# Patient Record
Sex: Female | Born: 1960
Health system: Southern US, Community
[De-identification: ages and names within clinical notes are randomized; demographics above are authoritative.]

## PROBLEM LIST (undated history)

## (undated) DIAGNOSIS — M5030 Other cervical disc degeneration, unspecified cervical region: Secondary | ICD-10-CM

## (undated) DIAGNOSIS — Z780 Asymptomatic menopausal state: Secondary | ICD-10-CM

## (undated) DIAGNOSIS — F419 Anxiety disorder, unspecified: Secondary | ICD-10-CM

## (undated) DIAGNOSIS — K5900 Constipation, unspecified: Secondary | ICD-10-CM

## (undated) DIAGNOSIS — I4891 Unspecified atrial fibrillation: Secondary | ICD-10-CM

## (undated) HISTORY — PX: ABDOMINAL HYSTERECTOMY: SHX81

## (undated) HISTORY — DX: Constipation, unspecified: K59.00

## (undated) HISTORY — PX: SHOULDER ARTHROSCOPY: SHX128

## (undated) HISTORY — DX: Unspecified atrial fibrillation: I48.91

## (undated) HISTORY — DX: Asymptomatic menopausal state: Z78.0

## (undated) HISTORY — PX: PLANTAR FASCIECTOMY: SUR600

## (undated) HISTORY — PX: BUNIONECTOMY: SHX129

## (undated) HISTORY — PX: CARDIAC ELECTROPHYSIOLOGY MAPPING AND ABLATION: SHX1292

## (undated) HISTORY — PX: SKIN CANCER EXCISION: SHX779

---

## 1998-03-18 HISTORY — PX: PARTIAL HYSTERECTOMY: SHX80

## 1999-03-19 HISTORY — PX: BREAST ENHANCEMENT SURGERY: SHX7

## 2003-12-10 ENCOUNTER — Emergency Department (HOSPITAL_COMMUNITY): Admission: AC | Admit: 2003-12-10 | Discharge: 2003-12-10 | Payer: Self-pay

## 2007-02-11 ENCOUNTER — Ambulatory Visit (HOSPITAL_COMMUNITY): Admission: RE | Admit: 2007-02-11 | Discharge: 2007-02-11 | Payer: Self-pay | Admitting: Family Medicine

## 2008-02-24 ENCOUNTER — Ambulatory Visit (HOSPITAL_COMMUNITY): Admission: RE | Admit: 2008-02-24 | Discharge: 2008-02-24 | Payer: Self-pay | Admitting: Family Medicine

## 2008-07-08 ENCOUNTER — Ambulatory Visit (HOSPITAL_COMMUNITY): Admission: RE | Admit: 2008-07-08 | Discharge: 2008-07-08 | Payer: Self-pay | Admitting: Family Medicine

## 2009-06-02 ENCOUNTER — Ambulatory Visit (HOSPITAL_COMMUNITY): Admission: RE | Admit: 2009-06-02 | Discharge: 2009-06-02 | Payer: Self-pay | Admitting: Family Medicine

## 2011-01-07 ENCOUNTER — Other Ambulatory Visit: Payer: Self-pay | Admitting: Family Medicine

## 2011-01-07 ENCOUNTER — Ambulatory Visit (HOSPITAL_COMMUNITY)
Admission: RE | Admit: 2011-01-07 | Discharge: 2011-01-07 | Disposition: A | Discharge status: Home / Self Care | Payer: BC Managed Care – PPO | Source: Ambulatory Visit | Attending: Family Medicine | Admitting: Family Medicine

## 2011-01-07 DIAGNOSIS — R51 Headache: Secondary | ICD-10-CM

## 2011-01-07 DIAGNOSIS — R110 Nausea: Secondary | ICD-10-CM | POA: Insufficient documentation

## 2011-06-25 ENCOUNTER — Other Ambulatory Visit: Payer: Self-pay | Admitting: Family Medicine

## 2011-06-25 ENCOUNTER — Ambulatory Visit (HOSPITAL_COMMUNITY)
Admission: RE | Admit: 2011-06-25 | Discharge: 2011-06-25 | Disposition: A | Discharge status: Home / Self Care | Payer: BC Managed Care – PPO | Source: Ambulatory Visit | Attending: Family Medicine | Admitting: Family Medicine

## 2011-06-25 DIAGNOSIS — R229 Localized swelling, mass and lump, unspecified: Secondary | ICD-10-CM | POA: Insufficient documentation

## 2011-06-25 DIAGNOSIS — R2230 Localized swelling, mass and lump, unspecified upper limb: Secondary | ICD-10-CM

## 2011-07-04 ENCOUNTER — Telehealth (INDEPENDENT_AMBULATORY_CARE_PROVIDER_SITE_OTHER): Payer: Self-pay

## 2011-07-04 NOTE — Telephone Encounter (Signed)
LMOV that Dr. Abbey Chatters would not remove a cyst from the pt's ear.  Recommended they contact a plastic surgeon or ENT.

## 2011-07-30 ENCOUNTER — Other Ambulatory Visit: Payer: Self-pay | Admitting: Obstetrics and Gynecology

## 2011-07-30 DIAGNOSIS — N63 Unspecified lump in unspecified breast: Secondary | ICD-10-CM

## 2011-08-05 ENCOUNTER — Other Ambulatory Visit: Payer: BC Managed Care – PPO

## 2011-08-08 ENCOUNTER — Ambulatory Visit
Admission: RE | Admit: 2011-08-08 | Discharge: 2011-08-08 | Disposition: A | Discharge status: Home / Self Care | Payer: BC Managed Care – PPO | Source: Ambulatory Visit | Attending: Obstetrics and Gynecology | Admitting: Obstetrics and Gynecology

## 2011-08-08 DIAGNOSIS — N63 Unspecified lump in unspecified breast: Secondary | ICD-10-CM

## 2012-02-12 ENCOUNTER — Other Ambulatory Visit: Payer: Self-pay | Admitting: Family Medicine

## 2012-02-12 ENCOUNTER — Ambulatory Visit (HOSPITAL_COMMUNITY)
Admission: RE | Admit: 2012-02-12 | Discharge: 2012-02-12 | Disposition: A | Discharge status: Home / Self Care | Payer: BC Managed Care – PPO | Source: Ambulatory Visit | Attending: Family Medicine | Admitting: Family Medicine

## 2012-02-12 DIAGNOSIS — R079 Chest pain, unspecified: Secondary | ICD-10-CM

## 2012-06-26 ENCOUNTER — Other Ambulatory Visit: Payer: Self-pay | Admitting: Obstetrics and Gynecology

## 2012-06-26 DIAGNOSIS — N63 Unspecified lump in unspecified breast: Secondary | ICD-10-CM

## 2012-07-08 ENCOUNTER — Other Ambulatory Visit: Payer: Self-pay | Admitting: Obstetrics and Gynecology

## 2012-07-08 ENCOUNTER — Ambulatory Visit
Admission: RE | Admit: 2012-07-08 | Discharge: 2012-07-08 | Disposition: A | Discharge status: Home / Self Care | Payer: BC Managed Care – PPO | Source: Ambulatory Visit | Attending: Obstetrics and Gynecology | Admitting: Obstetrics and Gynecology

## 2012-07-08 DIAGNOSIS — N63 Unspecified lump in unspecified breast: Secondary | ICD-10-CM

## 2012-07-09 ENCOUNTER — Ambulatory Visit (INDEPENDENT_AMBULATORY_CARE_PROVIDER_SITE_OTHER): Payer: BC Managed Care – PPO | Admitting: Family Medicine

## 2012-07-09 ENCOUNTER — Encounter: Payer: Self-pay | Admitting: Family Medicine

## 2012-07-09 VITALS — BP 112/74 | Temp 98.5°F | Wt 137.2 lb

## 2012-07-09 DIAGNOSIS — J309 Allergic rhinitis, unspecified: Secondary | ICD-10-CM | POA: Insufficient documentation

## 2012-07-09 DIAGNOSIS — K5900 Constipation, unspecified: Secondary | ICD-10-CM | POA: Insufficient documentation

## 2012-07-09 MED ORDER — FLUTICASONE PROPIONATE 50 MCG/ACT NA SUSP: 2.0000 | Freq: Every day | NASAL | Status: DC

## 2012-07-09 MED ORDER — AZITHROMYCIN 250 MG PO TABS: ORAL_TABLET | ORAL | Status: AC

## 2012-07-09 NOTE — Progress Notes (Signed)
  Subjective:    Patient ID: ARIJANA NARAYAN, female    DOB: 1960/07/15, 52 y.o.   MRN: [id number]  HPI Patient with head congestion runny nose sneezing coughing not feeling good denies high fever chills denies sinus pain denies tooth pain. No other particular problems. Patient does relate some intermittent severe constipation over the past few weeks with thin bowel movements and at times. Hard bowel movements she tries eating plenty of vegetables fruits and drinking plenty of water but still having this issue. No other particular troubles. She has had a colonoscopy although was several years ago. No history of allergies. No family history: Cancer.  Review of Systems See per above    Objective:   Physical Exam Vital signs stable. Eardrums normal sinus nontender throat is normal neck is supple lungs are clear heart is regular abdomen soft rectal exam deferred extremities no edema       Assessment & Plan:  #1 allergic rhinitis-Allegra over-the-counter 180 mg 1 every single day, Flonase 2 sprays each nostril daily, if progressive symptoms or if worse followup immediately. #2 mild sinusitis Zithromax as directed. #3 moderate constipation-referral to gastroenterology. With this change in bowel habits plus her age a repeat colonoscopy would be in order. She may use a fiber or mag citrate to try to help her.

## 2012-07-09 NOTE — Patient Instructions (Addendum)
benefiber (store brand) 1 to 2 tablespoon twice a day with liquids  May try mag citrate one bottle if needed    For allergy- generic Allegra 180 mg daily, add Flonase 2 sprays daily.Allergic Rhinitis Allergic rhinitis is when the mucous membranes in the nose respond to allergens. Allergens are particles in the air that cause your body to have an allergic reaction. This causes you to release allergic antibodies. Through a chain of events, these eventually cause you to release histamine into the blood stream (hence the use of antihistamines). Although meant to be protective to the body, it is this release that causes your discomfort, such as frequent sneezing, congestion and an itchy runny nose.  CAUSES  The pollen allergens may come from grasses, trees, and weeds. This is seasonal allergic rhinitis, or "hay fever." Other allergens cause year-round allergic rhinitis (perennial allergic rhinitis) such as house dust mite allergen, pet dander and mold spores.  SYMPTOMS   Nasal stuffiness (congestion).  Runny, itchy nose with sneezing and tearing of the eyes.  There is often an itching of the mouth, eyes and ears. It cannot be cured, but it can be controlled with medications. DIAGNOSIS  If you are unable to determine the offending allergen, skin or blood testing may find it. TREATMENT   Avoid the allergen.  Medications and allergy shots (immunotherapy) can help.  Hay fever may often be treated with antihistamines in pill or nasal spray forms. Antihistamines block the effects of histamine. There are over-the-counter medicines that may help with nasal congestion and swelling around the eyes. Check with your caregiver before taking or giving this medicine. If the treatment above does not work, there are many new medications your caregiver can prescribe. Stronger medications may be used if initial measures are ineffective. Desensitizing injections can be used if medications and avoidance fails.  Desensitization is when a patient is given ongoing shots until the body becomes less sensitive to the allergen. Make sure you follow up with your caregiver if problems continue. SEEK MEDICAL CARE IF:   You develop fever (more than 100.5 F (38.1 C).  You develop a cough that does not stop easily (persistent).  You have shortness of breath.  You start wheezing.  Symptoms interfere with normal daily activities. Document Released: 11/27/2000 Document Revised: 05/27/2011 Document Reviewed: 06/08/2008 [facility] Patient Information 2013 [place], [place].

## 2012-10-09 ENCOUNTER — Telehealth: Payer: Self-pay | Admitting: Family Medicine

## 2012-10-09 NOTE — Telephone Encounter (Signed)
ok 

## 2012-10-09 NOTE — Telephone Encounter (Signed)
Note has been written and patient has been notified.

## 2012-10-09 NOTE — Telephone Encounter (Signed)
States she woke up at 4:30am with a terrible migraine headache.  Took medication and headache reduced to mild for a couple of days.  Patient did go into work at [employer] on Saturday.  Wants to know if Dr. Brett Canales would write her a 4 hour note for Saturday morning.  States that if she a note then the points that she accrued will be dropped.   Thanks

## 2013-02-04 ENCOUNTER — Encounter: Payer: Self-pay | Admitting: Nurse Practitioner

## 2013-02-04 ENCOUNTER — Ambulatory Visit (INDEPENDENT_AMBULATORY_CARE_PROVIDER_SITE_OTHER): Payer: BC Managed Care – PPO | Admitting: Nurse Practitioner

## 2013-02-04 VITALS — BP 118/76 | Ht 69.5 in | Wt 143.0 lb

## 2013-02-04 DIAGNOSIS — K5900 Constipation, unspecified: Secondary | ICD-10-CM

## 2013-02-04 DIAGNOSIS — K5904 Chronic idiopathic constipation: Secondary | ICD-10-CM | POA: Insufficient documentation

## 2013-02-04 NOTE — Assessment & Plan Note (Signed)
Plan: Encourage patient to increase her fluid intake so she can restart her Benefiber or Metamucil. Avoid any form of daily laxative use. Continue probiotics as directed. Given suggestions for extreme constipation including:Fleets enema Magnesium citrate Milk and molasses enema 2 pints mixed with 8 oz molasses warm mixture and give by enema Followup with GI specialist if needed.

## 2013-02-04 NOTE — Progress Notes (Signed)
Subjective:  Presents for complaints of constipation that began about 6 months ago. Has a very complex history, has been seeing the gastroenterologist. Was not pleased with her visits overall. Has been on Amitiza and Linzess, had to stop each of these due to cramping which was severe at times. Can also not take any form of medicine similar to Dulcolax due to allergic reaction. Takes daily probiotic. No OTC medications. No other OTC supplements. Had a colonoscopy at age 52, not due again for another 5 years. Eats a very healthy diet high in fiber. Does not drink as much fluids as she used to. Has stopped smoking for several weeks. Has a bowel movement every 3-4 days, no large BMs but extremely hard. Had some relief with Smooth Move Organic Tea. Was advised by her specialist to stop using this. Does contain natural laxatives. Mainly having generalized lower abdominal discomfort. No fever. No blood in her stools. Stools normal color.  Objective:   BP 118/76  Ht 5' 9.5" (1.765 m)  Wt 143 lb (64.864 kg)  BMI 20.82 kg/m2 NAD. Alert, oriented. Lungs clear. Heart regular rate rhythm. Abdomen obese soft mildly distended, active bowel sounds x4. Generalized lower abdominal tenderness more so towards the left lower quadrant n. No rebound or guarding. No obvious masses.  Assessment:Chronic idiopathic constipation  Plan: Encourage patient to increase her fluid intake so she can restart her Benefiber or Metamucil. Avoid any form of daily laxative use. Continue probiotics as directed. Given suggestions for extreme constipation including:Fleets enema Magnesium citrate Milk and molasses enema 2 pints mixed with 8 oz molasses warm mixture and give by enema Followup with GI specialist if needed.

## 2013-02-04 NOTE — Patient Instructions (Addendum)
Fleets enema Magnesium citrate Milk and molasses enema 2 pints mixed with 8 oz molasses warm mixture and give by enema   Constipation, Adult Constipation is when a person has fewer than 3 bowel movements a week; has difficulty having a bowel movement; or has stools that are dry, hard, or larger than normal. As people grow older, constipation is more common. If you try to fix constipation with medicines that make you have a bowel movement (laxatives), the problem may get worse. Long-term laxative use may cause the muscles of the colon to become weak. A low-fiber diet, not taking in enough fluids, and taking certain medicines may make constipation worse. CAUSES   Certain medicines, such as antidepressants, pain medicine, iron supplements, antacids, and water pills.   Certain diseases, such as diabetes, irritable bowel syndrome (IBS), thyroid disease, or depression.   Not drinking enough water.   Not eating enough fiber-rich foods.   Stress or travel.  Lack of physical activity or exercise.  Not going to the restroom when there is the urge to have a bowel movement.  Ignoring the urge to have a bowel movement.  Using laxatives too much. SYMPTOMS   Having fewer than 3 bowel movements a week.   Straining to have a bowel movement.   Having hard, dry, or larger than normal stools.   Feeling full or bloated.   Pain in the lower abdomen.  Not feeling relief after having a bowel movement. DIAGNOSIS  Your caregiver will take a medical history and perform a physical exam. Further testing may be done for severe constipation. Some tests may include:   A barium enema X-ray to examine your rectum, colon, and sometimes, your small intestine.  A sigmoidoscopy to examine your lower colon.  A colonoscopy to examine your entire colon. TREATMENT  Treatment will depend on the severity of your constipation and what is causing it. Some dietary treatments include drinking more fluids  and eating more fiber-rich foods. Lifestyle treatments may include regular exercise. If these diet and lifestyle recommendations do not help, your caregiver may recommend taking over-the-counter laxative medicines to help you have bowel movements. Prescription medicines may be prescribed if over-the-counter medicines do not work.  HOME CARE INSTRUCTIONS   Increase dietary fiber in your diet, such as fruits, vegetables, whole grains, and beans. Limit high-fat and processed sugars in your diet, such as [place] fries, hamburgers, cookies, candies, and soda.   A fiber supplement may be added to your diet if you cannot get enough fiber from foods.   Drink enough fluids to keep your urine clear or pale yellow.   Exercise regularly or as directed by your caregiver.   Go to the restroom when you have the urge to go. Do not hold it.  Only take medicines as directed by your caregiver. Do not take other medicines for constipation without talking to your caregiver first. SEEK IMMEDIATE MEDICAL CARE IF:   You have bright red blood in your stool.   Your constipation lasts for more than 4 days or gets worse.   You have abdominal or rectal pain.   You have thin, pencil-like stools.  You have unexplained weight loss. MAKE SURE YOU:   Understand these instructions.  Will watch your condition.  Will get help right away if you are not doing well or get worse. Document Released: 12/01/2003 Document Revised: 05/27/2011 Document Reviewed: 02/05/2011 [facility] Patient Information 2014 [place], [place].

## 2013-06-01 ENCOUNTER — Telehealth: Payer: Self-pay | Admitting: Nurse Practitioner

## 2013-06-01 NOTE — Telephone Encounter (Signed)
Pt coming in for PE on 4/2 and wants to know if she will need BW orders for this appt Thinks maybe she is going through the change and wants to discuss this with you at her appt

## 2013-06-02 ENCOUNTER — Other Ambulatory Visit: Payer: Self-pay | Admitting: Nurse Practitioner

## 2013-06-02 DIAGNOSIS — Z0189 Encounter for other specified special examinations: Secondary | ICD-10-CM

## 2013-06-02 DIAGNOSIS — N951 Menopausal and female climacteric states: Secondary | ICD-10-CM

## 2013-06-02 NOTE — Telephone Encounter (Signed)
Patient notified

## 2013-06-02 NOTE — Telephone Encounter (Signed)
I recommend routine labs; will order these plus hormone checks. We will discuss results at PE. Please remind patient to do fasting labs.

## 2013-06-04 ENCOUNTER — Ambulatory Visit: Payer: BC Managed Care – PPO | Admitting: Nurse Practitioner

## 2013-06-15 LAB — CBC WITH DIFFERENTIAL/PLATELET
BASOS PCT: 0 % (ref 0–1)
Basophils Absolute: 0 10*3/uL (ref 0.0–0.1)
EOS ABS: 0.1 10*3/uL (ref 0.0–0.7)
Eosinophils Relative: 1 % (ref 0–5)
HCT: 41.1 % (ref 36.0–46.0)
Hemoglobin: 14 g/dL (ref 12.0–15.0)
Lymphocytes Relative: 24 % (ref 12–46)
Lymphs Abs: 2 10*3/uL (ref 0.7–4.0)
MCH: 32 pg (ref 26.0–34.0)
MCHC: 34.1 g/dL (ref 30.0–36.0)
MCV: 94.1 fL (ref 78.0–100.0)
MONOS PCT: 5 % (ref 3–12)
Monocytes Absolute: 0.4 10*3/uL (ref 0.1–1.0)
NEUTROS PCT: 70 % (ref 43–77)
Neutro Abs: 5.8 10*3/uL (ref 1.7–7.7)
Platelets: 263 10*3/uL (ref 150–400)
RBC: 4.37 MIL/uL (ref 3.87–5.11)
RDW: 13 % (ref 11.5–15.5)
WBC: 8.3 10*3/uL (ref 4.0–10.5)

## 2013-06-15 LAB — LIPID PANEL
CHOL/HDL RATIO: 2.4 ratio
CHOLESTEROL: 162 mg/dL (ref 0–200)
HDL: 68 mg/dL (ref >39)
LDL Cholesterol: 84 mg/dL (ref 0–99)
Triglycerides: 50 mg/dL (ref <150)
VLDL: 10 mg/dL (ref 0–40)

## 2013-06-15 LAB — HEPATIC FUNCTION PANEL
ALBUMIN: 3.9 g/dL (ref 3.5–5.2)
ALT: 18 U/L (ref 0–35)
AST: 19 U/L (ref 0–37)
Alkaline Phosphatase: 36 U/L — ABNORMAL LOW (ref 39–117)
Bilirubin, Direct: 0.1 mg/dL (ref 0.0–0.3)
Indirect Bilirubin: 0.3 mg/dL (ref 0.2–1.2)
Total Bilirubin: 0.4 mg/dL (ref 0.2–1.2)
Total Protein: 6.2 g/dL (ref 6.0–8.3)

## 2013-06-15 LAB — BASIC METABOLIC PANEL
BUN: 14 mg/dL (ref 6–23)
CHLORIDE: 102 meq/L (ref 96–112)
CO2: 29 mEq/L (ref 19–32)
CREATININE: 0.65 mg/dL (ref 0.50–1.10)
Calcium: 9.2 mg/dL (ref 8.4–10.5)
Glucose, Bld: 87 mg/dL (ref 70–99)
Potassium: 4.5 mEq/L (ref 3.5–5.3)
Sodium: 136 mEq/L (ref 135–145)

## 2013-06-15 LAB — LUTEINIZING HORMONE: LH: 46.9 m[IU]/mL

## 2013-06-15 LAB — FOLLICLE STIMULATING HORMONE: FSH: 36.4 m[IU]/mL

## 2013-06-15 LAB — TSH: TSH: 1.128 u[IU]/mL (ref 0.350–4.500)

## 2013-06-15 LAB — VITAMIN D 25 HYDROXY (VIT D DEFICIENCY, FRACTURES): Vit D, 25-Hydroxy: 58 ng/mL (ref 30–89)

## 2013-06-17 ENCOUNTER — Ambulatory Visit (INDEPENDENT_AMBULATORY_CARE_PROVIDER_SITE_OTHER): Payer: BC Managed Care – PPO | Admitting: Nurse Practitioner

## 2013-06-17 ENCOUNTER — Encounter: Payer: Self-pay | Admitting: Nurse Practitioner

## 2013-06-17 VITALS — BP 122/82 | Ht 69.5 in | Wt 142.4 lb

## 2013-06-17 DIAGNOSIS — M62838 Other muscle spasm: Secondary | ICD-10-CM

## 2013-06-17 DIAGNOSIS — K5900 Constipation, unspecified: Secondary | ICD-10-CM

## 2013-06-17 DIAGNOSIS — K5904 Chronic idiopathic constipation: Secondary | ICD-10-CM

## 2013-06-17 NOTE — Patient Instructions (Signed)
Progesterone

## 2013-06-21 ENCOUNTER — Encounter: Payer: Self-pay | Admitting: Nurse Practitioner

## 2013-06-21 NOTE — Progress Notes (Signed)
Subjective:  Presents for recheck on her chronic idiopathic constipation. Gets regular preventive health physicals through her gynecologist. Had lab work ordered through our office on 3/31. Continues to have significant issues with constipation. Now having a lot of bloating and gas after each meal, unassociated with any particular foods. Quit smoking over 6 months ago. Also complaints of pain and tenderness in the upper back neck area, no history of injury. Will occasionally lead to a headache. No numbness or weakness of the arms.  Objective:   BP 122/82  Ht 5' 9.5" (1.765 m)  Wt 142 lb 6.4 oz (64.592 kg)  BMI 20.73 kg/m2 NAD. Alert, oriented. Mildly anxious affect. Crying at times during office visit. Lungs clear. Heart regular rate rhythm. Abdomen soft nondistended with active bowel sounds x4; minimal lower abdominal tenderness slightly more towards the left side. No rebound or guarding. No obvious masses. Extremely tight tender muscles noted in the upper back/neck area. Good ROM of both shoulders. Hand and arm strength 5+. Sensation grossly intact.  Assessment: Problem List Items Addressed This Visit     Digestive   Chronic idiopathic constipation    Other Visit Diagnoses   Muscle spasms of head and/or neck    -  Primary      Plan: Refer to GI specialist for evaluation of chronic constipation. Recommend tens unit, massage therapy, ice/heat applications. Discussed importance of stress reduction. Call back if worsens or persists.

## 2013-06-28 ENCOUNTER — Telehealth: Payer: Self-pay | Admitting: Family Medicine

## 2013-06-28 NOTE — Telephone Encounter (Signed)
Patient calling to check on referral to gastro.

## 2013-06-29 NOTE — Telephone Encounter (Signed)
Appointment scheduled, pt aware.

## 2013-11-23 ENCOUNTER — Encounter: Payer: Self-pay | Admitting: Nurse Practitioner

## 2013-11-23 ENCOUNTER — Ambulatory Visit (INDEPENDENT_AMBULATORY_CARE_PROVIDER_SITE_OTHER): Payer: BC Managed Care – PPO | Admitting: Nurse Practitioner

## 2013-11-23 VITALS — BP 124/82 | Ht 69.5 in | Wt 143.0 lb

## 2013-11-23 DIAGNOSIS — J209 Acute bronchitis, unspecified: Secondary | ICD-10-CM

## 2013-11-23 DIAGNOSIS — J0110 Acute frontal sinusitis, unspecified: Secondary | ICD-10-CM

## 2013-11-23 MED ORDER — AZITHROMYCIN 250 MG PO TABS: ORAL_TABLET | ORAL | Status: DC

## 2013-11-24 ENCOUNTER — Encounter: Payer: Self-pay | Admitting: Nurse Practitioner

## 2013-11-24 NOTE — Progress Notes (Signed)
Subjective:  Presents complaints of cough and congestion that began 5 days ago. Nonproductive cough. Yellowish nasal mucus at times. No fever. Frontal area headache at times. Sore throat has improved. Ear pain. Slight wheeze at times. No vomiting diarrhea or abdominal pain. Taking fluids well. Quit smoking over a year ago.  Objective:   BP 124/82  Ht 5' 9.5" (1.765 m)  Wt 143 lb (64.864 kg)  BMI 20.82 kg/m2 NAD. Alert, oriented. TMs clear effusion, no erythema. Pharynx injected with green PND noted. Neck supple with mild soft anterior adenopathy. Lungs scattered expiratory crackles noted posterior, clear anterior. No wheezing or tachypnea. Heart regular rate rhythm.  Assessment: Acute frontal sinusitis, recurrence not specified  Acute bronchitis, unspecified organism  Plan:  Meds ordered this encounter  Medications  . Estradiol (MINIVELLE TD)    Sig: Place onto the skin.  . Progesterone Micronized (PROGESTERONE PO)    Sig: Take by mouth. Take 1 @@ bedtime  . azithromycin (ZITHROMAX Z-PAK) 250 MG tablet    Sig: Take 2 tablets (500 mg) on  Day 1,  followed by 1 tablet (250 mg) once daily on Days 2 through 5.    Dispense:  6 each    Refill:  0    Order Specific Question:  Supervising Provider    Answer:  Mikey Kirschner [[id number]]   OTC meds as directed for congestion and cough. Callback in 7-10 days if no improvement, sooner if worse.

## 2014-01-07 ENCOUNTER — Ambulatory Visit (INDEPENDENT_AMBULATORY_CARE_PROVIDER_SITE_OTHER): Payer: BC Managed Care – PPO | Admitting: Nurse Practitioner

## 2014-01-07 ENCOUNTER — Encounter: Payer: Self-pay | Admitting: Nurse Practitioner

## 2014-01-07 VITALS — BP 122/92 | Temp 98.2°F | Ht 69.5 in | Wt 147.0 lb

## 2014-01-07 DIAGNOSIS — J069 Acute upper respiratory infection, unspecified: Secondary | ICD-10-CM

## 2014-01-07 MED ORDER — AMOXICILLIN-POT CLAVULANATE 875-125 MG PO TABS: 1.0000 | ORAL_TABLET | Freq: Two times a day (BID) | ORAL | Status: DC

## 2014-01-10 ENCOUNTER — Encounter: Payer: Self-pay | Admitting: Nurse Practitioner

## 2014-01-10 NOTE — Progress Notes (Signed)
Subjective:  Presents for c/o congestion x 4 d. Sore throat. Cough worse at night. Green sputum. No headache or ear pain. Slight wheeze.   Objective:   BP 122/92  Temp(Src) 98.2 F (36.8 C)  Ht 5' 9.5" (1.765 m)  Wt 147 lb (66.679 kg)  BMI 21.40 kg/m2 NAD. Alert, oriented. TMs clear effusion. Pharynx injected with PND noted. Neck supple with mild anterior adenopathy. Lungs clear. Heart RRR.  Assessment: Acute upper respiratory infection   Plan:  Meds ordered this encounter  Medications  . ALPRAZolam (XANAX) 0.5 MG tablet    Sig:   . DISCONTD: ciprofloxacin (CIPRO) 500 MG tablet    Sig:   . diazepam (VALIUM) 5 MG tablet    Sig:   . amoxicillin-clavulanate (AUGMENTIN) 875-125 MG per tablet    Sig: Take 1 tablet by mouth 2 (two) times daily.    Dispense:  20 tablet    Refill:  0    Order Specific Question:  Supervising Provider    Answer:  Mikey Kirschner [[id number]]   OTC meds as directed for congestion and cough. Call back if worsens or persists.

## 2014-06-09 ENCOUNTER — Encounter: Payer: Self-pay | Admitting: Family Medicine

## 2014-06-09 ENCOUNTER — Telehealth: Payer: Self-pay | Admitting: Family Medicine

## 2014-06-09 ENCOUNTER — Ambulatory Visit (HOSPITAL_COMMUNITY)
Admission: RE | Admit: 2014-06-09 | Discharge: 2014-06-09 | Disposition: A | Discharge status: Home / Self Care | Payer: BLUE CROSS/BLUE SHIELD | Source: Ambulatory Visit | Attending: Family Medicine | Admitting: Family Medicine

## 2014-06-09 ENCOUNTER — Ambulatory Visit (INDEPENDENT_AMBULATORY_CARE_PROVIDER_SITE_OTHER): Payer: BLUE CROSS/BLUE SHIELD | Admitting: Family Medicine

## 2014-06-09 VITALS — BP 110/82 | Temp 98.7°F | Ht 69.5 in | Wt 142.0 lb

## 2014-06-09 DIAGNOSIS — M79662 Pain in left lower leg: Secondary | ICD-10-CM | POA: Insufficient documentation

## 2014-06-09 DIAGNOSIS — M542 Cervicalgia: Secondary | ICD-10-CM | POA: Insufficient documentation

## 2014-06-09 DIAGNOSIS — G44039 Episodic paroxysmal hemicrania, not intractable: Secondary | ICD-10-CM | POA: Diagnosis not present

## 2014-06-09 DIAGNOSIS — R079 Chest pain, unspecified: Secondary | ICD-10-CM

## 2014-06-09 MED ORDER — HYDROCODONE-ACETAMINOPHEN 5-325 MG PO TABS: ORAL_TABLET | ORAL | Status: DC

## 2014-06-09 MED ORDER — NAPROXEN 500 MG PO TABS: 500.0000 mg | ORAL_TABLET | Freq: Two times a day (BID) | ORAL | Status: DC

## 2014-06-09 NOTE — Progress Notes (Signed)
   Subjective:    Patient ID: Brenda King, female    DOB: 01-24-1961, 54 y.o.   MRN: [id number]  Headache  This is a new problem. The current episode started yesterday. The problem occurs constantly. The pain is located in the right unilateral region. The pain does not radiate. The pain quality is not similar to prior headaches. The quality of the pain is described as stabbing and sharp. The pain is severe. Associated symptoms include muscle aches and neck pain. Associated symptoms comments: Cramp "Charlie Horse" in left leg that started a month ago, chest pains 5 months ago. The symptoms are aggravated by activity. She has tried NSAIDs (hormone therapy) for the symptoms. The treatment provided no relief.    Sharp knife like pain. Right side of scalp. Tender and sore now. 2 days duration. No loss of consciousness no neurological symptoms  Move a little at times extremely severe   Chronic neck discomfort. At least a half year duration. Grinding sensation when she turns her head and neck back in for. Deep neck pain. Receives massage for this.  Leg cramping deep ache inb calf, developed into left calf discomfort. Which is last several weeks. Patient was very concerned about potential clot in her left calf.   Patient is experiencing chest discomfort. One half year duration. Atypical nature. Deep ache in chest. Often occurs with exertion. Had history of ablation a decade ago. Does smoke cigarettes. There is family history of heart disease also. She brings this Up, though she mainly presents for her headache today. Although states extremely worried about her leg and her chest also Sig fam hx of heart disease    Review of Systems  Musculoskeletal: Positive for neck pain.  Neurological: Positive for headaches.   No nausea no diaphoresis no rash no loss of consciousness    Objective:   Physical Exam   Alert vital stable HEENT normal. Lungs clear. Heart regular in rhythm. Scalp right  lateral scalp tenderness deep palpation no carotid bruits neck supple some pain with rotation. Lungs clear. Heart regular in rhythm. Legs bilateral good arterial pulses no edema left calf deep leg discomfort to palpation  EKG normal sinus rhythm no significant ST-T changes     Assessment & Plan:  Impression #1 headaches likely neuropathic in nature discussed at great length #2 left leg pain subacute patient extremely worried about clot unlikely though always possible discussed #3 chest pain atypical with exertion. Some risk factors. Plan cardiology referral. Initiate Naprosyn twice a day. Add hydrocodone when necessary for neuropathic headaches. Chest x-ray cervical x-rays. To assess pain better. Left leg ultrasound. 45 minutes easily spent with patient most in discussion WSL

## 2014-06-09 NOTE — Telephone Encounter (Signed)
Pt called stating that last night she was having sharp shooting pains in the back of her head like someone Was pulling her hair and letting go several times last night. Pt wants to know if she Needs to be seen she doesn't know what is causing it.

## 2014-06-09 NOTE — Telephone Encounter (Signed)
Transferred to front desk to schedule appointment.

## 2014-06-10 ENCOUNTER — Encounter: Payer: Self-pay | Admitting: Cardiology

## 2014-06-10 ENCOUNTER — Ambulatory Visit (INDEPENDENT_AMBULATORY_CARE_PROVIDER_SITE_OTHER): Payer: BLUE CROSS/BLUE SHIELD | Admitting: Cardiology

## 2014-06-10 VITALS — BP 100/58 | HR 65 | Ht 69.0 in | Wt 145.0 lb

## 2014-06-10 DIAGNOSIS — Z720 Tobacco use: Secondary | ICD-10-CM | POA: Diagnosis not present

## 2014-06-10 DIAGNOSIS — R079 Chest pain, unspecified: Secondary | ICD-10-CM | POA: Diagnosis not present

## 2014-06-10 DIAGNOSIS — I4891 Unspecified atrial fibrillation: Secondary | ICD-10-CM

## 2014-06-10 NOTE — Progress Notes (Signed)
Cardiology Office Note  Date: 06/10/2014   ID: Brenda King, DOB 10/09/1960, MRN [id number]  PCP: Rubbie Battiest, MD  Primary Cardiologist: Rozann Lesches, MD   Chief Complaint  Patient presents with  . Chest Pain    History of Present Illness: Brenda King is a 54 y.o. female referred for cardiology consultation by Dr. Wolfgang Phoenix. Available records reviewed, I see that there was an office visit just yesterday with mention of chest discomfort in the setting of other complaints including right occipital headaches, chronic neck pain, recurring hot flashes, and also left leg cramping. She has not had regular medical follow-up, her chart was incomplete, I tried to update it as best I could with limited information.  She is here with her husband today. She states that over the last 6 months she has been experienced intermittent "sharp," predominantly left sided chest discomfort. She initially felt an episode when she was walking on a track and had to stop what she was doing, became very frightened by the symptoms. She has limited her exercise since then, still has recurring symptoms with other activities. They can also occur at rest and more sporadically, lasting up to 10 minutes at a time. She does not endorse any palpitations or breathlessness with these symptoms, has not had syncope.  Prior cardiac history is obtained from the patient and her husband, there are no records in [facility]. She reportedly underwent atrial fibrillation ablation with Dr. Ola Spurr at [facility] back in 2004 and 2005. Prior to that was being followed by Dr. Caryl Comes. She has had no cardiology follow-up since that time, and has not been on any specific medical therapy. She states that she has been doing "great" from this perspective, no palpitations described.  Recent tracing obtained yesterday by Dr. Wolfgang Phoenix reviewed showing normal sinus rhythm.  She states that she had a stress test around the time she was undergoing evaluation for  her atrial arrhythmias. She is not aware of any abnormalities noted. She reports a family history of CAD and heart attack in both her father and her brother. She has no personal history of diabetes mellitus or hypertension, with favorable lipid panel noted. She does have a history of intermittent tobacco abuse, started back smoking cigarettes after quitting a year ago, states that she just stopped 2 days ago.   Past Medical History  Diagnosis Date  . Constipation   . Menopause   . Atrial fibrillation     Status post ablation 2004 and 2005 - Dr. Ola Spurr [facility])    Past Surgical History  Procedure Laterality Date  . Partial hysterectomy  2000   . Breast enhancement surgery  2001     Current Outpatient Prescriptions  Medication Sig Dispense Refill  . ALPRAZolam (XANAX) 0.5 MG tablet     . diazepam (VALIUM) 5 MG tablet     . naproxen (NAPROSYN) 500 MG tablet Take 1 tablet (500 mg total) by mouth 2 (two) times daily with a meal. 28 tablet 0  . Progesterone Micronized (PROGESTERONE PO) Take by mouth. Take 1 @@ bedtime    . Estradiol (MINIVELLE TD) Place onto the skin.    Marland Kitchen HYDROcodone-acetaminophen (NORCO/VICODIN) 5-325 MG per tablet Take one tablet every 4 - 6 hours prn pain (Patient not taking: Reported on 06/10/2014) 36 tablet 0   No current facility-administered medications for this visit.    Allergies:  Docusate sodium and Miralax   Social History: The patient  reports that she has been smoking Cigarettes.  She quit smokeless tobacco use about 19 months ago. She reports that she does not drink alcohol or use illicit drugs.   Family History: The patient's family history includes CAD in her brother and father.   ROS:  Please see the history of present illness. Otherwise, complete review of systems is positive for none.  All other systems are reviewed and negative.   Physical Exam: VS:  BP 100/58 mmHg  Pulse 65  Ht 5\' 9"  (1.753 m)  Wt 145 lb (65.772 kg)  BMI 21.40 kg/m2  SpO2  95%, BMI Body mass index is 21.4 kg/(m^2).  Wt Readings from Last 3 Encounters:  06/10/14 145 lb (65.772 kg)  06/09/14 142 lb (64.411 kg)  01/07/14 147 lb (66.679 kg)     General: Tall woman in no distress, complaining of "hot flashes." HEENT: Conjunctiva and lids normal, oropharynx clear with moist mucosa. Neck: Supple, no elevated JVP or carotid bruits, no thyromegaly. Lungs: Clear to auscultation, nonlabored breathing at rest. Cardiac: Regular rate and rhythm, no S3 or significant systolic murmur, no pericardial rub. Abdomen: Soft, nontender, bowel sounds present, no guarding or rebound. Extremities: No pitting edema, distal pulses 2+. Skin: Warm and dry. Musculoskeletal: No kyphosis. Neuropsychiatric: Alert and oriented x3, affect grossly appropriate.   ECG: Tracing from 06/09/2014 showed normal sinus rhythm.  Recent Labwork: 06/15/2013: ALT 18; AST 19; BUN 14; Creatinine 0.65; Hemoglobin 14.0; Platelets 263; Potassium 4.5; Sodium 136; TSH 1.128     Component Value Date/Time   CHOL 162 06/15/2013 1234   TRIG 50 06/15/2013 1234   HDL 68 06/15/2013 1234   CHOLHDL 2.4 06/15/2013 1234   VLDL 10 06/15/2013 1234   LDLCALC 84 06/15/2013 1234    Other Studies Reviewed Today:  1. Lower extremity Dopplers 06/09/2014: FINDINGS: Contralateral Common Femoral Vein: Respiratory phasicity is normal and symmetric with the symptomatic side. No evidence of thrombus. Normal compressibility.  Common Femoral Vein: No evidence of thrombus. Normal compressibility, respiratory phasicity and response to augmentation.  Saphenofemoral Junction: No evidence of thrombus. Normal compressibility and flow on color Doppler imaging.  Profunda Femoral Vein: No evidence of thrombus. Normal compressibility and flow on color Doppler imaging.  Femoral Vein: No evidence of thrombus. Normal compressibility, respiratory phasicity and response to augmentation.  Popliteal Vein: No evidence of  thrombus. Normal compressibility, respiratory phasicity and response to augmentation.  Calf Veins: No evidence of thrombus. Normal compressibility and flow on color Doppler imaging.  Superficial Great Saphenous Vein: No evidence of thrombus. Normal compressibility and flow on color Doppler imaging.  Venous Reflux: None.  Other Findings: No evidence of superficial thrombophlebitis or abnormal fluid collection.  IMPRESSION: No evidence of left lower extremity deep venous thrombosis.  2. Chest x-ray 06/09/2014: COMPARISON: 02/12/2012  FINDINGS: Normal heart size, mediastinal contours, and pulmonary vascularity.  Lungs hyperinflated but clear.  No pleural effusion or pneumothorax.  Osseous structures demineralized without focal abnormality.  IMPRESSION: Hyperinflated lungs without acute abnormality.   ASSESSMENT AND PLAN:  1. Exertional chest discomfort noted intermittently over the last 6 months with atypical features. Personal cardiac risk factors include intermittent history of tobacco abuse, also reported family history of CAD in her father and brother. There is no documented history of hypertension, diabetes, or hyperlipidemia requiring medical treatment. She does not endorse any palpitations with these symptoms to necessarily suspect recurring arrhythmia. We will obtain an exercise echocardiogram for further cardiac risk stratification and to exclude any ischemia that may require further workup.  2. Reported history of atrial fibrillation  ablation back in 2004 and 2005 with Dr. Ola Spurr at [facility]. She is in sinus rhythm today with normal ECG and reports no palpitations.  3. Intermittent tobacco abuse, stressed importance of smoking cessation. She states that she just quit again 2 days ago.  Current medicines were reviewed at length with the patient today.   Orders Placed This Encounter  Procedures  . Echocardiogram stress test without contrast     Disposition: Call with results.   Signed, Satira Sark, MD, [facility] 06/10/2014 10:25 AM    [facility] [address] [place],  [address] Phone: [phone]; Fax: [phone]

## 2014-06-10 NOTE — Patient Instructions (Signed)
Your physician has requested that you have a stress echocardiogram. For further information please visit [email or web address]. Please follow instruction sheet as given.  We will call you with results   Your physician recommends that you continue on your current medications as directed. Please refer to the Current Medication list given to you today.    Thank you for choosing [facility] !

## 2014-06-13 ENCOUNTER — Encounter: Payer: Self-pay | Admitting: Family Medicine

## 2014-06-13 ENCOUNTER — Ambulatory Visit: Payer: Self-pay | Admitting: Family Medicine

## 2014-06-13 ENCOUNTER — Encounter: Payer: Self-pay | Admitting: *Deleted

## 2014-06-14 ENCOUNTER — Encounter (HOSPITAL_COMMUNITY): Payer: Self-pay

## 2014-06-14 ENCOUNTER — Ambulatory Visit (HOSPITAL_COMMUNITY)
Admission: RE | Admit: 2014-06-14 | Discharge: 2014-06-14 | Disposition: A | Discharge status: Home / Self Care | Payer: BLUE CROSS/BLUE SHIELD | Source: Ambulatory Visit | Attending: Cardiology | Admitting: Cardiology

## 2014-06-14 DIAGNOSIS — R079 Chest pain, unspecified: Secondary | ICD-10-CM | POA: Diagnosis present

## 2014-06-14 NOTE — Progress Notes (Signed)
*  PRELIMINARY RESULTS* Echocardiogram Echocardiogram Stress Test has been performed.  Brenda King 06/14/2014, 1:07 PM

## 2014-06-14 NOTE — Progress Notes (Signed)
Stress Lab Nurses Notes - [place] 06/14/2014 Reason for doing test: Chest Pain Type of test: Stress Echo Nurse performing test: Gerrit Halls, RN Nuclear Medicine Tech: Not Applicable Echo Tech: Titus Mould MD performing test: [place]/ K.Purcell Nails NP Family MD: Mickie Hillier Test explained and consent signed: Yes.   IV started: No IV started Symptoms: NA Treatment/Intervention: None Reason test stopped: fatigue and reached target HR After recovery IV was: NA Patient to return to [facility]. Med at : NA Patient discharged: Home Patient's Condition upon discharge was: stable Comments: During test peak BP 162/49 & HR 157.  Recovery BP 116/64 & HR 86 .  Symptoms resolveld in recovery Geanie Cooley T

## 2014-06-16 ENCOUNTER — Ambulatory Visit: Payer: Self-pay | Admitting: Nurse Practitioner

## 2014-06-24 ENCOUNTER — Ambulatory Visit: Payer: BLUE CROSS/BLUE SHIELD | Admitting: Cardiology

## 2014-06-29 ENCOUNTER — Encounter: Payer: Self-pay | Admitting: Family Medicine

## 2014-06-29 ENCOUNTER — Ambulatory Visit (INDEPENDENT_AMBULATORY_CARE_PROVIDER_SITE_OTHER): Payer: BLUE CROSS/BLUE SHIELD | Admitting: Family Medicine

## 2014-06-29 VITALS — BP 100/70 | Ht 69.5 in | Wt 144.2 lb

## 2014-06-29 DIAGNOSIS — R079 Chest pain, unspecified: Secondary | ICD-10-CM

## 2014-06-29 DIAGNOSIS — M79662 Pain in left lower leg: Secondary | ICD-10-CM | POA: Diagnosis not present

## 2014-06-29 DIAGNOSIS — M542 Cervicalgia: Secondary | ICD-10-CM | POA: Diagnosis not present

## 2014-06-29 NOTE — Progress Notes (Signed)
   Subjective:    Patient ID: Brenda King, female    DOB: 03/05/1961, 54 y.o.   MRN: [id number] Patient arrives for a protracted discussion regarding all of her many recent challenges. HPI Patient is here today for a follow up visit on chest pain. Patient is also here to discuss the stress test results that was done at the hospital.  Stress test is found in the records. Reviewed with the patient. Completely normal.   Headache over all better, sat and sun after had the ice pick pains  Headache is different, overall better today.  Neck pain . His ongoing and chronic. Grinding sensation when she turns her head. Has used a chiropractor in the past. X-ray report reviewed and presence of patient no current chest pain headache back pain abdominal pain change in bowel habits blood in stool  Charley horses, now better and doing better. Legs overall feeling better. Patient was relieved to hear that it was not a clot.     Review of Systems See above    Objective:   Physical Exam Alert no acute distress H&T normal neck supple some pain with rotation lungs clear heart rare rhythm legs within normal limits       Assessment & Plan:  Impression chest pain noncardiac discussed #2 neck pain likely arthritis discuss #3 leg muscular pain discussed #4 neuropathic headaches improved discussed plan maintain symptom care. Exercise encourage. Stop smoking. Easily 25 minutes spent most in discussion WSL

## 2014-06-29 NOTE — Patient Instructions (Addendum)
Neuropathic headache.  Diffuse degenerative arthritic and disc disease present through out all the cervical vertebrae, but no evidence of foraminal involvement

## 2014-09-02 ENCOUNTER — Telehealth: Payer: Self-pay | Admitting: *Deleted

## 2014-09-02 NOTE — Telephone Encounter (Signed)
Dr. Richardson Landry received letter from [place]. Dr. Richardson Landry recommends office visit next week or week after to discuss ongoing pain and the next approve. Left message on voicemail notifing pt to call office and schedule office visit. Letter is at nurse's station to keep until her appt.

## 2014-09-06 NOTE — Telephone Encounter (Signed)
[facility] 09/06/14

## 2014-09-06 NOTE — Telephone Encounter (Signed)
Patient returned call informed patient that Dr.Steve recommends and office visit, this week or the week after to discuss ongoing pain. Patient verbalized understanding and was transferred up to the front to make an appointment.

## 2014-09-07 ENCOUNTER — Encounter: Payer: Self-pay | Admitting: Family Medicine

## 2014-09-07 ENCOUNTER — Ambulatory Visit (INDEPENDENT_AMBULATORY_CARE_PROVIDER_SITE_OTHER): Payer: BLUE CROSS/BLUE SHIELD | Admitting: Family Medicine

## 2014-09-07 VITALS — BP 112/74 | Ht 69.5 in | Wt 141.5 lb

## 2014-09-07 DIAGNOSIS — M542 Cervicalgia: Secondary | ICD-10-CM

## 2014-09-07 DIAGNOSIS — M5030 Other cervical disc degeneration, unspecified cervical region: Secondary | ICD-10-CM

## 2014-09-07 DIAGNOSIS — G5691 Unspecified mononeuropathy of right upper limb: Secondary | ICD-10-CM

## 2014-09-07 DIAGNOSIS — M792 Neuralgia and neuritis, unspecified: Secondary | ICD-10-CM

## 2014-09-07 MED ORDER — CHLORZOXAZONE 500 MG PO TABS: 500.0000 mg | ORAL_TABLET | Freq: Four times a day (QID) | ORAL | Status: DC | PRN

## 2014-09-07 MED ORDER — CYCLOBENZAPRINE HCL 10 MG PO TABS: 10.0000 mg | ORAL_TABLET | Freq: Every evening | ORAL | Status: DC | PRN

## 2014-09-07 NOTE — Progress Notes (Signed)
   Subjective:    Patient ID: Brenda King, female    DOB: [birth year], 54 y.o.   MRN: [id number]  Neck Pain  This is a chronic problem. The current episode started more than 1 month ago. The problem occurs intermittently. The problem has been gradually worsening. The pain is associated with nothing. The pain is moderate. The pain is same all the time. Stiffness is present all day. She has tried chiropractic manipulation for the symptoms. The treatment provided mild relief.   Patient states that she has no other concerns at this time.    Please see prior notes.  Patient has seen a chiropractor 14 or 15 times. Manipulations helped some but not a lot. They have recommended taking this in the next level. Next  Known history degenerative disease and neck. Substantial neck pain right shoulder more than left. Now expressing tingling going down into the right arm. No loss of strength.  Pain is severe toothache-like. Radiates down into the right arm and hand.  Now both day and night. Definitely affecting ability to do what she needs to do at work. Also affecting ability to carry on with personal life and to sleep  Review of Systems  Musculoskeletal: Positive for neck pain.  no headache no chest pain no abdominal pain     Objective:   Physical Exam  Alert moderate malaise right shoulder good range of motion distal hand strength intact sensation somewhat diminished neck severe pain with rotation positive posterior lateral tenderness right greater than left  Funduscopic ocular or pharyngeal exam normal. Lungs clear. Heart regular in rhythm.      Assessment & Plan:  Impression neuropathic pain with known degenerative changes. Symptoms mostly into the right arm. Failed at conservative measures via physical therapist plan MRI cervical spine. Trial of Flexeril or chlorzoxazone further recommendations either neurosurgeon if substantial mechanical disruption of the nerve or orthopedic surgeon with  interest in spine if not discussed at great length WSL

## 2014-09-20 ENCOUNTER — Other Ambulatory Visit (HOSPITAL_COMMUNITY): Payer: BLUE CROSS/BLUE SHIELD

## 2014-09-26 ENCOUNTER — Telehealth: Payer: Self-pay | Admitting: *Deleted

## 2014-09-26 ENCOUNTER — Other Ambulatory Visit: Payer: Self-pay | Admitting: *Deleted

## 2014-09-26 DIAGNOSIS — M549 Dorsalgia, unspecified: Secondary | ICD-10-CM

## 2014-09-26 MED ORDER — METAXALONE 800 MG PO TABS: ORAL_TABLET | ORAL | Status: DC

## 2014-09-26 NOTE — Telephone Encounter (Signed)
Discussed with pt. Pt wanted to let you know that pain started again on Monday and lasted all week. On the left side this time.  she had to see chiropractor tues, wed, and thurs last week. Going back this am. She states that's the only way to unlock her back. She is taking ibuprofen and aleve. Could not take the muscle relaxer because it cause nause and a migraine headache. Not able to take pain meds either. She states her pain last week was a 9 and today about a 3 -4 .

## 2014-09-26 NOTE — Telephone Encounter (Signed)
Discussed with pt. Chlorzoxazone added to allergies, skelaxin sent to pharm. Pt willing to try physical therapy. Order for physical therapy put in.

## 2014-09-26 NOTE — Telephone Encounter (Signed)
Please review MRI report from [facility].

## 2014-09-26 NOTE — Telephone Encounter (Signed)
Pt would like to know if she can get fmla for this issue she is having just in case she has to miss more days. Her hr manager told her she should get one.

## 2014-09-26 NOTE — Telephone Encounter (Signed)
Call pt, scan was surprisingly good, considering what the chiropractor had told her, her spine is remarkably normal for her age, with only mild degenerstive disc disease which is totally normal for someone 37, this means her pain is likely coming from muscles and ligaments. Surgery wont help. Nerve injections wont help. I f she'd like a referral to physical therapist, we can do that

## 2014-09-26 NOTE — Telephone Encounter (Signed)
Ok, we can try a different muscles spasm med if she'd like, document the one she cannot take in allergies. Can try skelaxin 800 one half to one bid prn 36.  My prior messge still stands , this is a chronic muscle and ligament issue and as such pt will have to go to her chiro or a PT for therapy

## 2014-09-26 NOTE — Telephone Encounter (Signed)
Ok will need to go thru [facility] and fill out forms/pay fee etc.

## 2014-10-03 NOTE — Telephone Encounter (Signed)
Discussed with patient and she states she will bring FMLA papers to be filled out.

## 2014-10-20 ENCOUNTER — Other Ambulatory Visit: Payer: Self-pay | Admitting: *Deleted

## 2014-10-20 ENCOUNTER — Ambulatory Visit (HOSPITAL_COMMUNITY): Payer: BLUE CROSS/BLUE SHIELD | Attending: Family Medicine | Admitting: Physical Therapy

## 2014-10-20 ENCOUNTER — Telehealth (HOSPITAL_COMMUNITY): Payer: Self-pay | Admitting: Physical Therapy

## 2014-10-20 DIAGNOSIS — M542 Cervicalgia: Secondary | ICD-10-CM | POA: Diagnosis present

## 2014-10-20 DIAGNOSIS — M545 Low back pain, unspecified: Secondary | ICD-10-CM

## 2014-10-20 DIAGNOSIS — R293 Abnormal posture: Secondary | ICD-10-CM | POA: Insufficient documentation

## 2014-10-20 DIAGNOSIS — M2569 Stiffness of other specified joint, not elsewhere classified: Secondary | ICD-10-CM

## 2014-10-20 DIAGNOSIS — M2560 Stiffness of unspecified joint, not elsewhere classified: Secondary | ICD-10-CM | POA: Insufficient documentation

## 2014-10-20 DIAGNOSIS — M436 Torticollis: Secondary | ICD-10-CM | POA: Diagnosis present

## 2014-10-20 NOTE — Patient Instructions (Signed)
AROM: Neck Flexion   Bend head forward and extend back as far as you can with your pain.  Repeat __10__ times per set. Do _1___ sets per session. Do __2-3__ sessions per day.  http://orth.exer.us/299   Copyright  VHI. All rights reserved.   AROM: Lateral Neck Flexion   Slowly tilt head toward one shoulder, then the other. Repeat __10__ times per set. Do _1___ sets per session. Do _2-3___ sessions per day.  http://orth.exer.us/297   Copyright  VHI. All rights reserved.   AROM: Neck Rotation   Turn head slowly to look over one shoulder, then the other.  Repeat _10___ times per set. Do _1___ sets per session. Do _2-3___ sessions per day.  http://orth.exer.us/295   Copyright  VHI. All rights reserved.   Chin Protraction / Retraction   Slide head forward keeping chin level. Slide head back, pulling chin in. Hold each position _2__ seconds. Repeat _15__ times. Do __2-3_ sessions per day.  Copyright  VHI. All rights reserved.   HIP EXCURSIONS  Stand with your feet shoulder width apart and hands on your hips and: 1) move your hips forward and back 10 times 2) move your hips side to side 10 times (swaying in the breeze) 3)  Rotate your hips from side to side 10 times

## 2014-10-20 NOTE — Therapy (Signed)
[place], [place], [address] Phone: [phone]   Fax:  [phone]  Physical Therapy Evaluation  Patient Details  Name: Brenda King MRN: [id number] Date of Birth: 27-Aug-1960 Referring Provider:  Mikey Kirschner, MD  Encounter Date: 10/20/2014      PT End of Session - 10/20/14 1833    Visit Number 1   Number of Visits 8   Date for PT Re-Evaluation 11/17/14   Authorization Type BCBS OOS   Authorization Time Period 10/20/14 to 12/20/14   PT Start Time 1517   PT Stop Time 1601   PT Time Calculation (min) 44 min   Activity Tolerance Patient tolerated treatment well   Behavior During Therapy [facility] for tasks assessed/performed;Anxious      Past Medical History  Diagnosis Date  . Constipation   . Menopause   . Atrial fibrillation     Status post ablation 2004 and 2005 - Dr. Ola Spurr [facility])    Past Surgical History  Procedure Laterality Date  . Partial hysterectomy  2000   . Breast enhancement surgery  2001     There were no vitals filed for this visit.  Visit Diagnosis:  Left-sided low back pain without sciatica - Plan: PT plan of care cert/re-cert  Neck pain - Plan: PT plan of care cert/re-cert  Neck stiffness - Plan: PT plan of care cert/re-cert  Poor posture - Plan: PT plan of care cert/re-cert  Back stiffness - Plan: PT plan of care cert/re-cert      Subjective Assessment - 10/20/14 1528    Subjective Patient reports that her low back doesn't really bother her much, but really started up yesterday. Reports her neck is the main issue right now, curious as to why MD referral is only for low back and not neck, but remains agreeable to lumbar exam. Reports that her job is VERY physical and involves heavy labor, it is extremely hot .  Reports she had been to chiropractor earlier today; neck sittfness had started progressively over the past year or so; had neck x-ray  and has been seen by  chiropractor so far for her neck.    Pertinent History Patient is adamant that her neck is what is bothering her the most and that MD should have sent referral for cervical spine; contacted MD office who did give verbal permission to also eval/treat cervical spine at this time and also sent over EPIC order for neck. MD's nurse also advised PT regarding general pain disorder and that MRI was clear of major concerns.     Patient Stated Goals get rid of pain in neck   Currently in Pain? Yes  patient did not rate on pain scale but emphasized that neck was the worse pain             [facility] PT Assessment - 10/20/14 0001    Assessment   Medical Diagnosis L lumbar pain and neck pain    Onset Date/Surgical Date --  chronic    Next MD Visit unable to recall    Precautions   Precautions None   Restrictions   Weight Bearing Restrictions No   Balance Screen   Has the patient fallen in the past 6 months No   Has the patient had a decrease in activity level because of a fear of falling?  No   Is the patient reluctant to leave their home because of a fear of falling?  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    Observation/Other Assessments   Observations some muscle knots noted on R side of upper traps, moderate muscle tightness noted on L as well but not to same extent    Focus on Therapeutic Outcomes (FOTO)  36% limited lumbar spine    AROM   Cervical Flexion 70   Cervical Extension 48   Cervical - Right Side Bend 41   Cervical - Left Side Bend 44   Cervical - Right Rotation 73   Cervical - Left Rotation 65   Lumbar Flexion 89   Lumbar Extension 10   Lumbar - Right Side Bend 33   Lumbar - Left Side Bend 33   Strength   Right Hip Flexion 4/5   Left Hip Flexion 4-/5   Right Knee Flexion 4/5   Right Knee Extension 5/5   Left  Knee Flexion 5/5   Left Knee Extension 5/5   Right Ankle Dorsiflexion 5/5   Left Ankle Dorsiflexion 5/5   Cervical Flexion 4+/5   Cervical Extension 4+/5   Cervical - Right Side Bend 4-/5   Cervical - Left Side Bend 4-/5   Cervical - Right Rotation 4+/5   Cervical - Left Rotation 4+/5                           PT Education - 10/20/14 1832    Education provided Yes   Education Details need for MD referral to treat neck, plan of care moving forward, HEP   Person(s) Educated Patient;Spouse   Methods Explanation;Handout   Comprehension Verbalized understanding;Need further instruction          PT Short Term Goals - 10/20/14 1840    PT SHORT TERM GOAL #1   Title Patient will increase cervical and lumbar mobility by at least 10 degrees on all planes in order to improve mechanics and reduce pain    Time 2   Period Weeks   Status New   PT SHORT TERM GOAL #2   Title Patient will experience no more than 3/10 pain in her neck or low back    Time 2   Period Weeks   Status New   PT SHORT TERM GOAL #3   Title Patient will be independent in correctly and consistently performing HEP, to be updated PRN    Time 2   Period Weeks   Status New           PT Long Term Goals - 10/20/14 1843    PT LONG TERM GOAL #1   Title Patient will experience minimal muscle knotting in bilateral upper traps and cervical extensors    Time 4   Period Weeks   Status New   PT LONG TERM GOAL #2   Title Patient will experience no more than 2/10 pain in her lumbar and cervical spines with all functional tasks and activities    Time 4   Period Weeks   Status New   PT LONG TERM GOAL #3   Title Patient will be independent in correctly and consistently performing appropriate advanced HEP for home management    Time 4   Period Weeks   Status New   PT LONG TERM GOAL #4   Title Patient will consistently demosntrate safe and successful lifting techniques in order to reduce stress  on  lumbar spine in order to reduce risk of reinjury    Time 4   Period Weeks   Status New               Plan - 10/20/14 1834    Clinical Impression Statement Patient arrived being somewhat surprised that MD referral was for lumbar spine only as she is having mostly cervical pain; called MD office and received EPIC order to also treat neck. Patient primarily demonstrates mild lumbar and cervical stifness, cervical and lumbar pain, increased muscle tension in upper traps and cervical extensors, and possible poor functional task performnace. MD's office did advise that MRI was clear of major concerns and that  patient has generalized pain disorder. Patient is also concurrently receiving chiropractic treatment for her neck but states that she does not feel it is doing a lot to help her pain for the long term. At this time patient may benefit from a trial of skilled PT services in order to address her deficits and for development of appropriate HEP for home management.    Pt will benefit from skilled therapeutic intervention in order to improve on the following deficits Hypomobility;Pain;Increased fascial restricitons;Increased muscle spasms;Decreased range of motion;Postural dysfunction;Impaired flexibility   Rehab Potential Fair   Clinical Impairments Affecting Rehab Potential generalized pain disorder    PT Frequency 2x / week   PT Duration 4 weeks   PT Treatment/Interventions ADLs/Self Care Home Management;Cryotherapy;Electrical Stimulation;Moist Heat;Therapeutic activities;Patient/family education;Manual techniques   PT Next Visit Plan review HEP and goals; postural training and cervical/lumbar mobility    PT Home Exercise Plan given    Consulted and Agree with Plan of Care Patient         Problem List Patient Active Problem List   Diagnosis Date Noted  . Chronic idiopathic constipation 02/04/2013  . Unspecified constipation 07/09/2012  . Allergic rhinitis 07/09/2012    Deniece Ree PT, DPT [place] [address] [place], [place], [address] Phone: [phone]   Fax:  [phone]

## 2014-10-20 NOTE — Telephone Encounter (Signed)
Kristen requested that I call Dr. Wolfgang Phoenix due to patient complaining of neck pain and the order was written for LBP. Dr. Lance Sell Nurse Criss Alvine Owens Shark reports that Dr. Wolfgang Phoenix has done MRI which was clear, Dr. Wolfgang Phoenix will send order over for neck eval.

## 2014-10-31 DIAGNOSIS — Z029 Encounter for administrative examinations, unspecified: Secondary | ICD-10-CM

## 2014-11-08 ENCOUNTER — Ambulatory Visit (HOSPITAL_COMMUNITY): Payer: BLUE CROSS/BLUE SHIELD

## 2014-11-14 ENCOUNTER — Ambulatory Visit (HOSPITAL_COMMUNITY): Payer: BLUE CROSS/BLUE SHIELD

## 2014-11-14 DIAGNOSIS — M2560 Stiffness of unspecified joint, not elsewhere classified: Secondary | ICD-10-CM

## 2014-11-14 DIAGNOSIS — M545 Low back pain, unspecified: Secondary | ICD-10-CM

## 2014-11-14 DIAGNOSIS — M542 Cervicalgia: Secondary | ICD-10-CM

## 2014-11-14 DIAGNOSIS — M436 Torticollis: Secondary | ICD-10-CM

## 2014-11-14 DIAGNOSIS — M2569 Stiffness of other specified joint, not elsewhere classified: Secondary | ICD-10-CM

## 2014-11-14 DIAGNOSIS — R293 Abnormal posture: Secondary | ICD-10-CM

## 2014-11-14 NOTE — Therapy (Signed)
[place], [place], [address] Phone: [phone]   Fax:  [phone]  Physical Therapy Treatment  Patient Details  Name: Brenda King MRN: [id number] Date of Birth: Aug 24, 1960 Referring Provider:  Mikey Kirschner, MD  Encounter Date: 11/14/2014      PT End of Session - 11/14/14 0904    Visit Number 2   Number of Visits 8   Date for PT Re-Evaluation 11/17/14   Authorization Type BCBS OOS   Authorization Time Period 10/20/14 to 12/20/14   PT Start Time 0854   PT Stop Time 0933   PT Time Calculation (min) 39 min   Activity Tolerance Patient tolerated treatment well   Behavior During Therapy [facility] for tasks assessed/performed      Past Medical History  Diagnosis Date  . Constipation   . Menopause   . Atrial fibrillation     Status post ablation 2004 and 2005 - Dr. Ola Spurr [facility])    Past Surgical History  Procedure Laterality Date  . Partial hysterectomy  2000   . Breast enhancement surgery  2001     There were no vitals filed for this visit.  Visit Diagnosis:  Left-sided low back pain without sciatica  Neck pain  Neck stiffness  Poor posture  Back stiffness      Subjective Assessment - 11/14/14 0859    Subjective Pt stated she feels her neck is catching on the Rt side this morning, constant discomfort.  Pt reports relief with chiropractor   Currently in Pain? Yes   Pain Score 3    Pain Location Neck   Pain Orientation Right   Pain Descriptors / Indicators Discomfort;Constant           [facility] Adult PT Treatment/Exercise - 11/14/14 0001    Exercises   Exercises Neck   Neck Exercises: Standing   Other Standing Exercises 3D hip excursion 10x   Neck Exercises: Seated   Neck Retraction 10 reps;5 secs   W Back 10 reps   Postural Training Educated on proper posture landmarks   Other Seated Exercise 3D cervical excursion 10x   Other Seated Exercise 3D thoracic excursion 10x             PT Education - 11/14/14 0942    Education provided Yes   Education Details Reviewed goals, anwered questions with HEP, educated importance of proper posture   Person(s) Educated Patient   Methods Explanation;Handout   Comprehension Verbalized understanding;Returned demonstration;Need further instruction          PT Short Term Goals - 10/20/14 1840    PT SHORT TERM GOAL #1   Title Patient will increase cervical and lumbar mobility by at least 10 degrees on all planes in order to improve mechanics and reduce pain    Time 2   Period Weeks   Status New   PT SHORT TERM GOAL #2   Title Patient will experience no more than 3/10 pain in her neck or low back    Time 2   Period Weeks   Status New   PT SHORT TERM GOAL #3   Title Patient will be independent in correctly and consistently performing HEP, to be updated PRN    Time 2   Period Weeks   Status New           PT Long Term Goals - 10/20/14 1843    PT LONG TERM GOAL #1   Title Patient will experience minimal  muscle knotting in bilateral upper traps and cervical extensors    Time 4   Period Weeks   Status New   PT LONG TERM GOAL #2   Title Patient will experience no more than 2/10 pain in her lumbar and cervical spines with all functional tasks and activities    Time 4   Period Weeks   Status New   PT LONG TERM GOAL #3   Title Patient will be independent in correctly and consistently performing appropriate advanced HEP for home management    Time 4   Period Weeks   Status New   PT LONG TERM GOAL #4   Title Patient will consistently demosntrate safe and successful lifting techniques in order to reduce stress on lumbar spine in order to reduce risk of reinjury    Time 4   Period Weeks   Status New               Plan - 11/14/14 0915    Clinical Impression Statement Reviewed goals. complaince with HEP answering questions about exercises and copy of evaluation given to pt.  Verbal and tactile  cueing for proper form with exercises.  Session focus on improving spinal mobility for cervical, thoracic and lumbar ROM and education on importance of proper posture with landmark cueing. No reports of increased pain through session.     PT Next Visit Plan Next session begin with manual to UT and cervical extensor musculature and progress postural training and cervical/lumbar mobility         Problem List Patient Active Problem List   Diagnosis Date Noted  . Chronic idiopathic constipation 02/04/2013  . Unspecified constipation 07/09/2012  . Allergic rhinitis 07/09/2012   Ihor Austin, LPTA; CBIS [phone]  Aldona Lento 11/14/2014, 9:45 AM  [place] [address] [place], [place], [address] Phone: [phone]   Fax:  [phone]

## 2014-11-16 ENCOUNTER — Ambulatory Visit (HOSPITAL_COMMUNITY): Payer: BLUE CROSS/BLUE SHIELD | Admitting: Physical Therapy

## 2014-11-16 DIAGNOSIS — M2569 Stiffness of other specified joint, not elsewhere classified: Secondary | ICD-10-CM

## 2014-11-16 DIAGNOSIS — R293 Abnormal posture: Secondary | ICD-10-CM

## 2014-11-16 DIAGNOSIS — M545 Low back pain, unspecified: Secondary | ICD-10-CM

## 2014-11-16 DIAGNOSIS — M542 Cervicalgia: Secondary | ICD-10-CM

## 2014-11-16 DIAGNOSIS — M2560 Stiffness of unspecified joint, not elsewhere classified: Secondary | ICD-10-CM

## 2014-11-16 DIAGNOSIS — M436 Torticollis: Secondary | ICD-10-CM

## 2014-11-16 NOTE — Patient Instructions (Signed)
Flexibility: Upper Trapezius Stretch    DO NOTgrasp right side of head while reaching behind back with other hand like is shown in this picture.  Instead, grab the chair,tilt head away and look towards the arm that is grabbing the chair until a gentle stretch is felt. Hold _30___ seconds. Repeat __3__ times per set. Do __1__ sets per session. Do __2__ sessions per day.  http://orth.exer.us/341   Copyright  VHI. All rights reserved.   Flexibility: Corner Stretch   Standing in corner with hands just above shoulder level and feet __approximately 6-8__ inches from corner, lean forward until a comfortable stretch is felt across chest. Hold _30___ seconds. Repeat __3__ times per set. Do _1___ sets per session. Do _2___ sessions per day.  http://orth.exer.us/343   Copyright  VHI. All rights reserved.

## 2014-11-16 NOTE — Therapy (Signed)
[place], [place], [address] Phone: [phone]   Fax:  [phone]  Physical Therapy Treatment  Patient Details  Name: Brenda King MRN: [id number] Date of Birth: 04-19-1960 Referring Provider:  Mikey Kirschner, MD  Encounter Date: 11/16/2014      PT End of Session - 11/16/14 1109    Visit Number 3   Number of Visits 12   Date for PT Re-Evaluation 11/17/14   Authorization Type BCBS OOS   Authorization Time Period 10/20/14 to 12/20/14   PT Start Time 1020   PT Stop Time 1103   PT Time Calculation (min) 43 min   Activity Tolerance Patient tolerated treatment well   Behavior During Therapy [facility] for tasks assessed/performed      Past Medical History  Diagnosis Date  . Constipation   . Menopause   . Atrial fibrillation     Status post ablation 2004 and 2005 - Dr. Ola Spurr [facility])    Past Surgical History  Procedure Laterality Date  . Partial hysterectomy  2000   . Breast enhancement surgery  2001     There were no vitals filed for this visit.  Visit Diagnosis:  Left-sided low back pain without sciatica  Neck pain  Neck stiffness  Poor posture  Back stiffness      Subjective Assessment - 11/16/14 1022    Subjective Patient reports that she is feeling like her back/neck is starting to become jacked up; has had to use an ice pack the past two mornings to be able to move. Reports that she is still working on getting more consistent with HEP.    Pertinent History Patient is adamant that her neck is what is bothering her the most and that MD should have sent referral for cervical spine; contacted MD office who did give verbal permission to also eval/treat cervical spine at this time and also sent over EPIC order for neck. MD's nurse also advised PT regarding general pain disorder and that MRI was clear of major concerns.     Patient Stated Goals get rid of pain in neck   Currently in Pain? Yes    Pain Score 3    Pain Location Neck   Pain Orientation Right                         [facility] Adult PT Treatment/Exercise - 11/16/14 0001    Neck Exercises: Standing   Other Standing Exercises 3D hip excursion 10x   Neck Exercises: Seated   Neck Retraction 15 reps   Other Seated Exercise 3D thoracic and cervical excursions 1x10    Other Seated Exercise Posterior shoulder rolls 1x20   Lumbar Exercises: Supine   Bridge 15 reps   Other Supine Lumbar Exercises Dead bugs 1x10 each side    Manual Therapy   Manual Therapy Soft tissue mobilization   Soft tissue mobilization focus on R cervical extensors and R upper trap    Neck Exercises: Stretches   Upper Trapezius Stretch 2 reps;30 seconds   Corner Stretch 2 reps;30 seconds                PT Education - 11/16/14 1108    Education provided Yes   Education Details importance of postural stretching, updated HEP, requested that patient make 4-6 more appointments    Person(s) Educated Patient   Methods Explanation   Comprehension Verbalized understanding  PT Short Term Goals - 10/20/14 1840    PT SHORT TERM GOAL #1   Title Patient will increase cervical and lumbar mobility by at least 10 degrees on all planes in order to improve mechanics and reduce pain    Time 2   Period Weeks   Status New   PT SHORT TERM GOAL #2   Title Patient will experience no more than 3/10 pain in her neck or low back    Time 2   Period Weeks   Status New   PT SHORT TERM GOAL #3   Title Patient will be independent in correctly and consistently performing HEP, to be updated PRN    Time 2   Period Weeks   Status New           PT Long Term Goals - 10/20/14 1843    PT LONG TERM GOAL #1   Title Patient will experience minimal muscle knotting in bilateral upper traps and cervical extensors    Time 4   Period Weeks   Status New   PT LONG TERM GOAL #2   Title Patient will experience no more than 2/10 pain in her lumbar  and cervical spines with all functional tasks and activities    Time 4   Period Weeks   Status New   PT LONG TERM GOAL #3   Title Patient will be independent in correctly and consistently performing appropriate advanced HEP for home management    Time 4   Period Weeks   Status New   PT LONG TERM GOAL #4   Title Patient will consistently demosntrate safe and successful lifting techniques in order to reduce stress on lumbar spine in order to reduce risk of reinjury    Time 4   Period Weeks   Status New               Plan - 11/16/14 1154    Clinical Impression Statement Continued functional mobilty exercises for hips, thoracic, and cervical spines, also introduced fucntional stretching for postural muscles and initiated lumbar stabilization exercises. Finished session with manual to R upper traps and cervical externsors. Updated HEP.    Pt will benefit from skilled therapeutic intervention in order to improve on the following deficits Hypomobility;Pain;Increased fascial restricitons;Increased muscle spasms;Decreased range of motion;Postural dysfunction;Impaired flexibility   Rehab Potential Fair   Clinical Impairments Affecting Rehab Potential generalized pain disorder    PT Frequency 2x / week   PT Duration 4 weeks   PT Treatment/Interventions ADLs/Self Care Home Management;Cryotherapy;Electrical Stimulation;Moist Heat;Therapeutic activities;Patient/family education;Manual techniques   PT Next Visit Plan Next session begin with manual to UT and cervical extensor musculature and progress postural training and cervical/lumbar mobility. Trial kinesiotaping to upper traps.    PT Home Exercise Plan given    Consulted and Agree with Plan of Care Patient        Problem List Patient Active Problem List   Diagnosis Date Noted  . Chronic idiopathic constipation 02/04/2013  . Unspecified constipation 07/09/2012  . Allergic rhinitis 07/09/2012    Deniece Ree PT,  DPT [place] [address] [place], [place], [address] Phone: [phone]   Fax:  [phone]

## 2014-11-23 ENCOUNTER — Encounter (HOSPITAL_COMMUNITY): Payer: BLUE CROSS/BLUE SHIELD

## 2014-11-29 ENCOUNTER — Ambulatory Visit (HOSPITAL_COMMUNITY): Payer: BLUE CROSS/BLUE SHIELD | Attending: Family Medicine | Admitting: Physical Therapy

## 2014-11-29 DIAGNOSIS — M2560 Stiffness of unspecified joint, not elsewhere classified: Secondary | ICD-10-CM | POA: Diagnosis present

## 2014-11-29 DIAGNOSIS — M542 Cervicalgia: Secondary | ICD-10-CM | POA: Diagnosis present

## 2014-11-29 DIAGNOSIS — R293 Abnormal posture: Secondary | ICD-10-CM | POA: Diagnosis present

## 2014-11-29 DIAGNOSIS — M2569 Stiffness of other specified joint, not elsewhere classified: Secondary | ICD-10-CM

## 2014-11-29 DIAGNOSIS — M436 Torticollis: Secondary | ICD-10-CM | POA: Insufficient documentation

## 2014-11-29 DIAGNOSIS — M545 Low back pain, unspecified: Secondary | ICD-10-CM

## 2014-11-29 NOTE — Therapy (Signed)
[place], [place], [address] Phone: [phone]   Fax:  [phone]  Physical Therapy Treatment (Re-assessment)  Patient Details  Name: Brenda King MRN: [id number] Date of Birth: 24-Mar-1960 Referring Provider:  Mikey Kirschner, MD  Encounter Date: 11/29/2014      PT End of Session - 11/29/14 1607    Visit Number 4   Number of Visits 8   Date for PT Re-Evaluation 12/27/14   Authorization Type [employer] OOS   Authorization Time Period 10/20/14 to 12/20/14   PT Start Time 1433   PT Stop Time 1512   PT Time Calculation (min) 39 min   Activity Tolerance Patient tolerated treatment well   Behavior During Therapy [facility] for tasks assessed/performed      Past Medical History  Diagnosis Date  . Constipation   . Menopause   . Atrial fibrillation     Status post ablation 2004 and 2005 - Dr. Ola Spurr [facility])    Past Surgical History  Procedure Laterality Date  . Partial hysterectomy  2000   . Breast enhancement surgery  2001     There were no vitals filed for this visit.  Visit Diagnosis:  Left-sided low back pain without sciatica  Neck pain  Neck stiffness  Poor posture  Back stiffness      Subjective Assessment - 11/29/14 1436    Subjective Patient reports that she still has trouble fitting HEP into her work schedule but is trying to make it a priority; also reports taht she has to pull on her neck during upper trap stretch to feel relief despite PT instructing her not to do this.    Pertinent History Patient is adamant that her neck is what is bothering her the most and that MD should have sent referral for cervical spine; contacted MD office who did give verbal permission to also eval/treat cervical spine at this time and also sent over EPIC order for neck. MD's nurse also advised PT regarding general pain disorder and that MRI was clear of major concerns.     Patient Stated Goals get rid of pain in  neck   Currently in Pain? Yes   Pain Score 1    Pain Location Neck   Pain Orientation Right            [facility] PT Assessment - 11/29/14 0001    AROM   Cervical Flexion 71   Cervical Extension 50   Cervical - Right Side Bend 45   Cervical - Left Side Bend 43   Cervical - Right Rotation 76   Cervical - Left Rotation 73   Lumbar Flexion 79   Lumbar Extension 15   Lumbar - Right Side Bend 30   Lumbar - Left Side Bend 30   Strength   Right Hip Flexion 4/5   Right Hip Extension 4/5   Right Hip ABduction 4+/5   Left Hip Flexion 4/5   Left Hip Extension 4/5   Left Hip ABduction 4+/5   Right Knee Flexion 4+/5   Right Knee Extension 5/5   Left Knee Flexion 5/5   Left Knee Extension 5/5   Right Ankle Dorsiflexion 5/5   Left Ankle Dorsiflexion 5/5   Cervical Flexion 4+/5   Cervical Extension 4+/5   Cervical - Right Side Bend 4/5   Cervical - Left Side Bend 4/5   Cervical - Right Rotation 4+/5   Cervical - Left Rotation 4+/5  [facility] PT Treatment/Exercise - 11/29/14 0001    Neck Exercises: Standing   Other Standing Exercises 3D hip excursion 10x   Neck Exercises: Seated   Other Seated Exercise 3D thoracic and cervical excursions 1x10                 PT Education - 11/29/14 1606    Education provided Yes   Education Details progress with skilled PT services, plan of care moving forward    Person(s) Educated Patient   Methods Explanation   Comprehension Verbalized understanding          PT Short Term Goals - 11/29/14 1457    PT SHORT TERM GOAL #1   Title Patient will increase cervical and lumbar mobility by at least 10 degrees on all planes in order to improve mechanics and reduce pain    Time 2   Period Weeks   Status On-going   PT SHORT TERM GOAL #2   Title Patient will experience no more than 3/10 pain in her neck or low back    Time 2   Period Weeks   Status Achieved   PT SHORT TERM GOAL #3   Title Patient will  be independent in correctly and consistently performing HEP, to be updated PRN    Time 2   Period Weeks   Status On-going           PT Long Term Goals - 11/29/14 1500    PT LONG TERM GOAL #1   Title Patient will experience minimal muscle knotting in bilateral upper traps and cervical extensors    Time 4   Period Weeks   Status On-going   PT LONG TERM GOAL #2   Title Patient will experience no more than 2/10 pain in her lumbar and cervical spines with all functional tasks and activities    Time 4   Period Weeks   Status Achieved   PT LONG TERM GOAL #3   Title Patient will be independent in correctly and consistently performing appropriate advanced HEP for home management    Time 4   Period Weeks   Status On-going   PT LONG TERM GOAL #4   Title Patient will consistently demosntrate safe and successful lifting techniques in order to reduce stress on lumbar spine in order to reduce risk of reinjury    Time 4   Period Weeks   Status On-going               Plan - 11/29/14 1608    Clinical Impression Statement Re-assessment performed today. Patient demonstrates decrease in pain however continues to demonstrate some stiffness in lumbar and cervical spines as well as mild strength impairment in bilateral lower extremities and cervical musculature. Patient also continues to demonstrate muscle knotting in upper traps and cervical musculature. AT this point patient will benefit from skilled PT services, for 4 more weeks, at 1x/week, to address remainnig impairments and create appropriate advanced HEP before DC.    Pt will benefit from skilled therapeutic intervention in order to improve on the following deficits Hypomobility;Pain;Increased fascial restricitons;Increased muscle spasms;Decreased range of motion;Postural dysfunction;Impaired flexibility   Rehab Potential Fair   Clinical Impairments Affecting Rehab Potential generalized pain disorder    PT Frequency 1x / week   PT  Duration 4 weeks   PT Treatment/Interventions ADLs/Self Care Home Management;Cryotherapy;Electrical Stimulation;Moist Heat;Therapeutic activities;Patient/family education;Manual techniques   PT Next Visit Plan Next session begin with manual to UT and cervical extensor musculature and progress  postural training and cervical/lumbar mobility. Trial kinesiotaping to upper traps.    PT Home Exercise Plan given    Consulted and Agree with Plan of Care Patient        Problem List Patient Active Problem List   Diagnosis Date Noted  . Chronic idiopathic constipation 02/04/2013  . Unspecified constipation 07/09/2012  . Allergic rhinitis 07/09/2012    Physical Therapy Progress Note  Dates of Reporting Period: 10/20/14 to 11/29/14  Objective Reports of Subjective Statement: see above   Objective Measurements: see above   Goal Update: see above   Plan: see above   Reason Skilled Services are Required: continue to improve cervical and lumbar mobility, pain reduction, posture, functional lifting, develop advanced HEP before [place] [facility], DPT [place], [place], [address] Phone: [phone]   Fax:  [phone]

## 2014-12-01 ENCOUNTER — Encounter (HOSPITAL_COMMUNITY): Payer: BLUE CROSS/BLUE SHIELD | Admitting: Physical Therapy

## 2014-12-09 ENCOUNTER — Ambulatory Visit (HOSPITAL_COMMUNITY): Payer: BLUE CROSS/BLUE SHIELD | Admitting: Physical Therapy

## 2015-05-11 ENCOUNTER — Ambulatory Visit (INDEPENDENT_AMBULATORY_CARE_PROVIDER_SITE_OTHER): Payer: BLUE CROSS/BLUE SHIELD | Admitting: Family Medicine

## 2015-05-11 ENCOUNTER — Encounter: Payer: Self-pay | Admitting: Family Medicine

## 2015-05-11 VITALS — BP 110/78 | Temp 98.7°F | Ht 69.5 in | Wt 143.5 lb

## 2015-05-11 DIAGNOSIS — J209 Acute bronchitis, unspecified: Secondary | ICD-10-CM

## 2015-05-11 DIAGNOSIS — J0110 Acute frontal sinusitis, unspecified: Secondary | ICD-10-CM | POA: Diagnosis not present

## 2015-05-11 MED ORDER — ALBUTEROL SULFATE HFA 108 (90 BASE) MCG/ACT IN AERS: 2.0000 | INHALATION_SPRAY | RESPIRATORY_TRACT | Status: DC | PRN

## 2015-05-11 MED ORDER — CLARITHROMYCIN 500 MG PO TABS: 500.0000 mg | ORAL_TABLET | Freq: Two times a day (BID) | ORAL | Status: DC

## 2015-05-11 MED ORDER — BENZONATATE 100 MG PO CAPS: 100.0000 mg | ORAL_CAPSULE | Freq: Four times a day (QID) | ORAL | Status: DC | PRN

## 2015-05-11 NOTE — Progress Notes (Signed)
   Subjective:    Patient ID: ELFRIEDA PONE, female    DOB: 1960-03-20, 55 y.o.   MRN: [id number]  URI  This is a new problem. The current episode started in the past 7 days. The problem has been unchanged. Associated symptoms include coughing, headaches and a sore throat. Associated symptoms comments: fever. She has tried decongestant and NSAIDs for the symptoms. The treatment provided no relief.    migr like heda che   And chest pain   Energy level down aapetite cdecent  Notes headache diffuse in nature.  Cough and deep. With wheezy texture at times.  Diffuse achiness.  No energy no appetite   Review of Systems  HENT: Positive for sore throat.   Respiratory: Positive for cough.   Neurological: Positive for headaches.       Objective:   Physical Exam  Alert hydration good. HEENT mild nasal congestion present normal neck supple lungs bilateral minimal wheezes no tachypnea bronchial cough      Assessment & Plan:  Impression flu with secondary sinusitis/bronchitis plan albuterol for reactive airways. Biaxin twice a day 10 days. Symptom care discussed WSL

## 2016-01-08 ENCOUNTER — Ambulatory Visit (INDEPENDENT_AMBULATORY_CARE_PROVIDER_SITE_OTHER): Payer: BLUE CROSS/BLUE SHIELD | Admitting: Family Medicine

## 2016-01-08 ENCOUNTER — Encounter: Payer: Self-pay | Admitting: Family Medicine

## 2016-01-08 VITALS — BP 112/74 | Temp 99.1°F | Ht 70.0 in | Wt 149.0 lb

## 2016-01-08 DIAGNOSIS — R319 Hematuria, unspecified: Secondary | ICD-10-CM

## 2016-01-08 DIAGNOSIS — R3129 Other microscopic hematuria: Secondary | ICD-10-CM | POA: Diagnosis not present

## 2016-01-08 DIAGNOSIS — R0600 Dyspnea, unspecified: Secondary | ICD-10-CM

## 2016-01-08 LAB — POCT URINALYSIS DIPSTICK
RBC UA: 50
Spec Grav, UA: <=1.005
pH, UA: 6

## 2016-01-08 NOTE — Progress Notes (Signed)
   Subjective:    Patient ID: Brenda King, female    DOB: October 01, 1960, 55 y.o.   MRN: [id number] Patient arrives office with numerous concerns Hematuria  This is a new problem. Episode onset: last week.  went for physical at gyn and was told she had blood in urine. Pt states urine culture was normal.  On further history had microscopic hematuria quite a few years ago. Did see urologist at that time. Had a workup which was negative. No gross hematuria. No obvious this area.  Blood seen once again in urine and spin quite a few years. No history of kidney stones. Next  Patient anxious about the presence of blood in her urine. Next  Also notes and concerned about shortness of breath. Short of breath with any type of exertion. Strong family history emphysema lung cancer. Despite this patient continues to smoke   Pt wants to do check up today also.   Hx of possible blood in the urine in the past   Went on to see a urologist    Four years      Review of Systems  Genitourinary: Positive for hematuria.   No flank pain no back pain no significant abdominal pain no change bowel habits    Objective:   Physical Exam  Alert vital stable HET normal lungs diminished. Breath sounds diffusely heart regular rhythm abdominal exam benign no [place] X  Urinalysis 2-4 red blood cells per high-power field      Assessment & Plan:  Impression 1 microscopic hematuria with substantial anxiety and patient is a smoker which increases risk of bladder etiologies which are more serious. Long discussion held patient would like to get on with things #2 progressive shortness of breath likely substantial COPD element plan pulmonary function tests and chest x-ray. Urology referral. Rationale discussed WSL

## 2016-01-09 ENCOUNTER — Encounter: Payer: Self-pay | Admitting: Family Medicine

## 2016-01-17 ENCOUNTER — Ambulatory Visit (HOSPITAL_COMMUNITY)
Admission: RE | Admit: 2016-01-17 | Discharge: 2016-01-17 | Disposition: A | Discharge status: Home / Self Care | Payer: BLUE CROSS/BLUE SHIELD | Source: Ambulatory Visit | Attending: Family Medicine | Admitting: Family Medicine

## 2016-01-17 DIAGNOSIS — R0600 Dyspnea, unspecified: Secondary | ICD-10-CM | POA: Insufficient documentation

## 2016-01-17 DIAGNOSIS — J449 Chronic obstructive pulmonary disease, unspecified: Secondary | ICD-10-CM | POA: Diagnosis not present

## 2016-01-17 LAB — PULMONARY FUNCTION TEST
FEF 25-75 PRE: 2.38 L/s
FEF 25-75 Post: 2.68 L/sec
FEF2575-%Change-Post: 12 %
FEF2575-%Pred-Post: 89 %
FEF2575-%Pred-Pre: 79 %
FEV1-%Change-Post: 3 %
FEV1-%PRED-POST: 97 %
FEV1-%Pred-Pre: 95 %
FEV1-PRE: 3.16 L
FEV1-Post: 3.25 L
FEV1FVC-%Change-Post: 4 %
FEV1FVC-%Pred-Pre: 91 %
FEV6-%CHANGE-POST: 0 %
FEV6-%PRED-PRE: 104 %
FEV6-%Pred-Post: 104 %
FEV6-POST: 4.3 L
FEV6-Pre: 4.29 L
FEV6FVC-%Change-Post: 1 %
FEV6FVC-%PRED-PRE: 101 %
FEV6FVC-%Pred-Post: 103 %
FVC-%CHANGE-POST: -1 %
FVC-%Pred-Post: 101 %
FVC-%Pred-Pre: 102 %
FVC-POST: 4.3 L
FVC-Pre: 4.36 L
POST FEV6/FVC RATIO: 100 %
Post FEV1/FVC ratio: 76 %
Pre FEV1/FVC ratio: 72 %
Pre FEV6/FVC Ratio: 99 %

## 2016-01-17 MED ORDER — ALBUTEROL SULFATE (2.5 MG/3ML) 0.083% IN NEBU
2.5000 mg | INHALATION_SOLUTION | Freq: Once | RESPIRATORY_TRACT | Status: AC
  Administered 2016-01-17: 2.5 mg via RESPIRATORY_TRACT

## 2016-01-18 ENCOUNTER — Ambulatory Visit (INDEPENDENT_AMBULATORY_CARE_PROVIDER_SITE_OTHER): Payer: BLUE CROSS/BLUE SHIELD | Admitting: Family Medicine

## 2016-01-18 ENCOUNTER — Encounter: Payer: Self-pay | Admitting: Family Medicine

## 2016-01-18 VITALS — BP 108/66 | Ht 70.0 in | Wt 150.4 lb

## 2016-01-18 DIAGNOSIS — J209 Acute bronchitis, unspecified: Secondary | ICD-10-CM | POA: Diagnosis not present

## 2016-01-18 DIAGNOSIS — J4521 Mild intermittent asthma with (acute) exacerbation: Secondary | ICD-10-CM

## 2016-01-18 DIAGNOSIS — R319 Hematuria, unspecified: Secondary | ICD-10-CM

## 2016-01-18 LAB — POCT URINALYSIS DIPSTICK
Leukocytes, UA: NEGATIVE
SPEC GRAV UA: 1.01
pH, UA: 6

## 2016-01-18 MED ORDER — ALBUTEROL SULFATE HFA 108 (90 BASE) MCG/ACT IN AERS: 2.0000 | INHALATION_SPRAY | Freq: Four times a day (QID) | RESPIRATORY_TRACT | 2 refills | Status: DC | PRN

## 2016-01-18 MED ORDER — AZITHROMYCIN 250 MG PO TABS: ORAL_TABLET | ORAL | 0 refills | Status: DC

## 2016-01-18 NOTE — Progress Notes (Signed)
   Subjective:    Patient ID: Brenda King, female    DOB: 1960/05/12, 55 y.o.   MRN: [id number]  HPI  Patient in today for a 2 week recheck on hematuria and pulmonary function test. Patient notes shortness of breath with exertion. She has tried to quit in the past. She does not want prescription medications for this.  Comes in for review of results  States no other concerns this visit.   Results for orders placed or performed in visit on 01/18/16  POCT urinalysis dipstick  Result Value Ref Range   Color, UA YELLOW    Clarity, UA CLEAR    Glucose, UA     Bilirubin, UA     Ketones, UA     Spec Grav, UA 1.010    Blood, UA MODERATE    pH, UA 6.0    Protein, UA     Urobilinogen, UA     Nitrite, UA     Leukocytes, UA Negative Negative   Productive cough over the last couple days. With some worsening wheezing. Yellowish phlegm at times.  No dysuria or increased frequency.  Review of Systems No headache, no major weight loss or weight gain, no chest pain no back pain abdominal pain no change in bowel habits complete ROS otherwise negative     Objective:   Physical Exam  Alert vitals stable, NAD. Blood pressure good on repeat. HEENT normal. Lungs clear. Heart regular rate and rhythm.   Pulmonary function tests review significant decline in FEF 25-75, with improvement with albuterol. Next  Chest x-ray shows no evidence of abnormality.  No COPD present at this time on pulmonary function tests or x-rays  Urinalysis 2-3 red blood cells per high-power field    Assessment & Plan:  Impression 1 reactive airways discussed with patient. Definitely more associated with folks who smoke. This is discussed. Positive family history of emphysema. Plan patient declines Chantix or Wellbutrin. We will work herself on cutting down smoking. Albuterol 2 sprays 4 times a day when necessary for wheezing. Of note urinalysis repeated to 3 red blood cells seen once again #2 acute distress for  symptoms positive production of yellowish phlegm. Prescription use in case this worsens. #3 microscopic hematuria. Ongoing. With small number red blood cells. Plan antibiotics prescribed. Importance of urology visit once again rediscussed. 25 minutes spent most in the discussion

## 2016-01-22 ENCOUNTER — Ambulatory Visit: Payer: BLUE CROSS/BLUE SHIELD | Admitting: Family Medicine

## 2016-02-21 ENCOUNTER — Ambulatory Visit (INDEPENDENT_AMBULATORY_CARE_PROVIDER_SITE_OTHER): Payer: BLUE CROSS/BLUE SHIELD | Admitting: Urology

## 2016-02-21 ENCOUNTER — Other Ambulatory Visit (HOSPITAL_COMMUNITY)
Admission: AD | Admit: 2016-02-21 | Discharge: 2016-02-21 | Disposition: A | Discharge status: Home / Self Care | Payer: BLUE CROSS/BLUE SHIELD | Source: Skilled Nursing Facility | Attending: Urology | Admitting: Urology

## 2016-02-21 DIAGNOSIS — R3121 Asymptomatic microscopic hematuria: Secondary | ICD-10-CM | POA: Insufficient documentation

## 2016-02-21 LAB — URINALYSIS, ROUTINE W REFLEX MICROSCOPIC
Bilirubin Urine: NEGATIVE
GLUCOSE, UA: NEGATIVE mg/dL
Hgb urine dipstick: NEGATIVE
Ketones, ur: 20 mg/dL — AB
LEUKOCYTES UA: NEGATIVE
Nitrite: NEGATIVE
PH: 5 (ref 5.0–8.0)
Protein, ur: 30 mg/dL — AB

## 2016-03-01 ENCOUNTER — Other Ambulatory Visit: Payer: Self-pay | Admitting: Urology

## 2016-03-01 DIAGNOSIS — R3121 Asymptomatic microscopic hematuria: Secondary | ICD-10-CM

## 2016-03-15 ENCOUNTER — Ambulatory Visit (HOSPITAL_COMMUNITY)
Admission: RE | Admit: 2016-03-15 | Discharge: 2016-03-15 | Disposition: A | Discharge status: Home / Self Care | Payer: BLUE CROSS/BLUE SHIELD | Source: Ambulatory Visit | Attending: Urology | Admitting: Urology

## 2016-03-15 ENCOUNTER — Encounter (HOSPITAL_COMMUNITY): Payer: Self-pay

## 2016-03-15 DIAGNOSIS — R3121 Asymptomatic microscopic hematuria: Secondary | ICD-10-CM | POA: Insufficient documentation

## 2016-03-15 DIAGNOSIS — I708 Atherosclerosis of other arteries: Secondary | ICD-10-CM | POA: Insufficient documentation

## 2016-03-15 DIAGNOSIS — I700 Atherosclerosis of aorta: Secondary | ICD-10-CM | POA: Insufficient documentation

## 2016-03-15 MED ORDER — IOPAMIDOL (ISOVUE-300) INJECTION 61%
INTRAVENOUS | Status: AC
  Filled 2016-03-15: qty 100

## 2016-03-15 MED ORDER — IOPAMIDOL (ISOVUE-300) INJECTION 61%
100.0000 mL | Freq: Once | INTRAVENOUS | Status: AC | PRN
  Administered 2016-03-15: 100 mL via INTRAVENOUS

## 2016-03-15 MED ORDER — SODIUM CHLORIDE 0.9 % IV SOLN
INTRAVENOUS | Status: AC
  Filled 2016-03-15: qty 250

## 2016-03-27 ENCOUNTER — Ambulatory Visit (INDEPENDENT_AMBULATORY_CARE_PROVIDER_SITE_OTHER): Payer: BLUE CROSS/BLUE SHIELD | Admitting: Urology

## 2016-03-27 DIAGNOSIS — R3121 Asymptomatic microscopic hematuria: Secondary | ICD-10-CM | POA: Diagnosis not present

## 2016-04-11 ENCOUNTER — Other Ambulatory Visit: Payer: Self-pay | Admitting: Urology

## 2016-04-12 ENCOUNTER — Encounter (HOSPITAL_COMMUNITY): Payer: Self-pay

## 2016-04-12 NOTE — Patient Instructions (Addendum)
Your procedure is scheduled on: 04/17/2016  Report to [facility] at  10:00   AM.  Call this number if you have problems the morning of surgery: [phone]   Remember:   Do not drink or eat food:After Midnight.  :  Take these medicines the morning of surgery with A SIP OF WATER: Xaxax              Use inhaler albuterol am of procedure    Do not wear jewelry, make-up or nail polish.  Do not wear lotions, powders, or perfumes. You may wear deodorant.  Do not shave 48 hours prior to surgery. Men may shave face and neck.  Do not bring valuables to the hospital.  Contacts, dentures or bridgework may not be worn into surgery.  Leave suitcase in the car. After surgery it may be brought to your room.  For patients admitted to the hospital, checkout time is 11:00 AM the day of discharge.   Patients discharged the day of surgery will not be allowed to drive home.    Special Instructions: Shower using CHG night before surgery and shower the day of surgery use CHG.  Use special wash - you have one bottle of CHG for all showers.  You should use approximately 1/2 of the bottle for each shower.  Cystoscopy, Care After Refer to this sheet in the next few weeks. These instructions provide you with information about caring for yourself after your procedure. Your health care provider may also give you more specific instructions. Your treatment has been planned according to current medical practices, but problems sometimes occur. Call your health care provider if you have any problems or questions after your procedure. What can I expect after the procedure? After the procedure, it is common to have:  Mild pain when you urinate. Pain should stop within a few minutes after you urinate. This may last for up to 1 week.  A small amount of blood in your urine for several days.  Feeling like you need to urinate but producing only a small amount of urine. Follow these instructions at home:    Medicines  Take over-the-counter and prescription medicines only as told by your health care provider.  If you were prescribed an antibiotic medicine, take it as told by your health care provider. Do not stop taking the antibiotic even if you start to feel better. General instructions  Return to your normal activities as told by your health care provider. Ask your health care provider what activities are safe for you.  Do not drive for 24 hours if you received a sedative.  Watch for any blood in your urine. If the amount of blood in your urine increases, call your health care provider.  Follow instructions from your health care provider about eating or drinking restrictions.  If a tissue sample was removed for testing (biopsy) during your procedure, it is your responsibility to get your test results. Ask your health care provider or the department performing the test when your results will be ready.  Drink enough fluid to keep your urine clear or pale yellow.  Keep all follow-up visits as told by your health care provider. This is important. Contact a health care provider if:  You have pain that gets worse or does not get better with medicine, especially pain when you urinate.  You have difficulty urinating. Get help right away if:  You have more blood in your urine.  You have blood clots in your  urine.  You have abdominal pain.  You have a fever or chills.  You are unable to urinate. This information is not intended to replace advice given to you by your health care provider. Make sure you discuss any questions you have with your health care provider. Document Released: 09/21/2004 Document Revised: 08/10/2015 Document Reviewed: 01/19/2015 Elsevier Interactive Patient Education  2017 [employer] Anesthesia, Adult, Care After These instructions provide you with information about caring for yourself after your procedure. Your health care provider may also give you  more specific instructions. Your treatment has been planned according to current medical practices, but problems sometimes occur. Call your health care provider if you have any problems or questions after your procedure. What can I expect after the procedure? After the procedure, it is common to have:  Vomiting.  A sore throat.  Mental slowness. It is common to feel:  Nauseous.  Cold or shivery.  Sleepy.  Tired.  Sore or achy, even in parts of your body where you did not have surgery. Follow these instructions at home: For at least 24 hours after the procedure:  Do not:  Participate in activities where you could fall or become injured.  Drive.  Use heavy machinery.  Drink alcohol.  Take sleeping pills or medicines that cause drowsiness.  Make important decisions or sign legal documents.  Take care of children on your own.  Rest. Eating and drinking  If you vomit, drink water, juice, or soup when you can drink without vomiting.  Drink enough fluid to keep your urine clear or pale yellow.  Make sure you have little or no nausea before eating solid foods.  Follow the diet recommended by your health care provider. General instructions  Have a responsible adult stay with you until you are awake and alert.  Return to your normal activities as told by your health care provider. Ask your health care provider what activities are safe for you.  Take over-the-counter and prescription medicines only as told by your health care provider.  If you smoke, do not smoke without supervision.  Keep all follow-up visits as told by your health care provider. This is important. Contact a health care provider if:  You continue to have nausea or vomiting at home, and medicines are not helpful.  You cannot drink fluids or start eating again.  You cannot urinate after 8-12 hours.  You develop a skin rash.  You have fever.  You have increasing redness at the site of your  procedure. Get help right away if:  You have difficulty breathing.  You have chest pain.  You have unexpected bleeding.  You feel that you are having a life-threatening or urgent problem. This information is not intended to replace advice given to you by your health care provider. Make sure you discuss any questions you have with your health care provider. Document Released: 06/10/2000 Document Revised: 08/07/2015 Document Reviewed: 02/16/2015 Elsevier Interactive Patient Education  2017 [employer].

## 2016-04-15 ENCOUNTER — Encounter (HOSPITAL_COMMUNITY): Payer: Self-pay

## 2016-04-15 ENCOUNTER — Encounter (HOSPITAL_COMMUNITY)
Admission: RE | Admit: 2016-04-15 | Discharge: 2016-04-15 | Disposition: A | Discharge status: Home / Self Care | Payer: BLUE CROSS/BLUE SHIELD | Source: Ambulatory Visit | Attending: Urology | Admitting: Urology

## 2016-04-15 ENCOUNTER — Other Ambulatory Visit: Payer: Self-pay

## 2016-04-15 DIAGNOSIS — F17210 Nicotine dependence, cigarettes, uncomplicated: Secondary | ICD-10-CM | POA: Diagnosis not present

## 2016-04-15 DIAGNOSIS — I4891 Unspecified atrial fibrillation: Secondary | ICD-10-CM | POA: Diagnosis not present

## 2016-04-15 DIAGNOSIS — Z01818 Encounter for other preprocedural examination: Secondary | ICD-10-CM | POA: Insufficient documentation

## 2016-04-15 DIAGNOSIS — R3121 Asymptomatic microscopic hematuria: Secondary | ICD-10-CM | POA: Insufficient documentation

## 2016-04-15 DIAGNOSIS — Z79899 Other long term (current) drug therapy: Secondary | ICD-10-CM | POA: Diagnosis not present

## 2016-04-15 DIAGNOSIS — Z8249 Family history of ischemic heart disease and other diseases of the circulatory system: Secondary | ICD-10-CM | POA: Diagnosis not present

## 2016-04-15 DIAGNOSIS — M1990 Unspecified osteoarthritis, unspecified site: Secondary | ICD-10-CM | POA: Diagnosis not present

## 2016-04-15 DIAGNOSIS — R310 Gross hematuria: Secondary | ICD-10-CM | POA: Diagnosis not present

## 2016-04-15 DIAGNOSIS — Z7951 Long term (current) use of inhaled steroids: Secondary | ICD-10-CM | POA: Diagnosis not present

## 2016-04-15 DIAGNOSIS — M5030 Other cervical disc degeneration, unspecified cervical region: Secondary | ICD-10-CM | POA: Diagnosis not present

## 2016-04-15 DIAGNOSIS — F419 Anxiety disorder, unspecified: Secondary | ICD-10-CM | POA: Diagnosis not present

## 2016-04-15 DIAGNOSIS — Z7982 Long term (current) use of aspirin: Secondary | ICD-10-CM | POA: Diagnosis not present

## 2016-04-15 HISTORY — DX: Anxiety disorder, unspecified: F41.9

## 2016-04-15 HISTORY — DX: Other cervical disc degeneration, unspecified cervical region: M50.30

## 2016-04-15 LAB — CBC
HEMATOCRIT: 39.8 % (ref 36.0–46.0)
HEMOGLOBIN: 13.7 g/dL (ref 12.0–15.0)
MCH: 33.1 pg (ref 26.0–34.0)
MCHC: 34.4 g/dL (ref 30.0–36.0)
MCV: 96.1 fL (ref 78.0–100.0)
Platelets: 298 10*3/uL (ref 150–400)
RBC: 4.14 MIL/uL (ref 3.87–5.11)
RDW: 12.5 % (ref 11.5–15.5)
WBC: 6.8 10*3/uL (ref 4.0–10.5)

## 2016-04-15 LAB — BASIC METABOLIC PANEL
Anion gap: 5 (ref 5–15)
BUN: 10 mg/dL (ref 6–20)
CHLORIDE: 105 mmol/L (ref 101–111)
CO2: 27 mmol/L (ref 22–32)
Calcium: 8.8 mg/dL — ABNORMAL LOW (ref 8.9–10.3)
Creatinine, Ser: 0.69 mg/dL (ref 0.44–1.00)
GFR calc non Af Amer: >60 mL/min (ref >60)
Glucose, Bld: 87 mg/dL (ref 65–99)
Potassium: 3.9 mmol/L (ref 3.5–5.1)
Sodium: 137 mmol/L (ref 135–145)

## 2016-04-17 ENCOUNTER — Ambulatory Visit (HOSPITAL_COMMUNITY): Payer: BLUE CROSS/BLUE SHIELD

## 2016-04-17 ENCOUNTER — Ambulatory Visit (HOSPITAL_COMMUNITY): Payer: BLUE CROSS/BLUE SHIELD | Admitting: Anesthesiology

## 2016-04-17 ENCOUNTER — Encounter (HOSPITAL_COMMUNITY): Payer: Self-pay

## 2016-04-17 ENCOUNTER — Encounter (HOSPITAL_COMMUNITY)
Admission: RE | Disposition: A | Discharge status: Home / Self Care | Payer: Self-pay | Source: Ambulatory Visit | Attending: Urology

## 2016-04-17 ENCOUNTER — Ambulatory Visit (HOSPITAL_COMMUNITY)
Admission: RE | Admit: 2016-04-17 | Discharge: 2016-04-17 | Disposition: A | Discharge status: Home / Self Care | Payer: BLUE CROSS/BLUE SHIELD | Source: Ambulatory Visit | Attending: Urology | Admitting: Urology

## 2016-04-17 DIAGNOSIS — Z79899 Other long term (current) drug therapy: Secondary | ICD-10-CM | POA: Insufficient documentation

## 2016-04-17 DIAGNOSIS — Z7951 Long term (current) use of inhaled steroids: Secondary | ICD-10-CM | POA: Insufficient documentation

## 2016-04-17 DIAGNOSIS — F419 Anxiety disorder, unspecified: Secondary | ICD-10-CM | POA: Insufficient documentation

## 2016-04-17 DIAGNOSIS — M5030 Other cervical disc degeneration, unspecified cervical region: Secondary | ICD-10-CM | POA: Insufficient documentation

## 2016-04-17 DIAGNOSIS — R310 Gross hematuria: Secondary | ICD-10-CM | POA: Insufficient documentation

## 2016-04-17 DIAGNOSIS — Z8249 Family history of ischemic heart disease and other diseases of the circulatory system: Secondary | ICD-10-CM | POA: Insufficient documentation

## 2016-04-17 DIAGNOSIS — M1990 Unspecified osteoarthritis, unspecified site: Secondary | ICD-10-CM | POA: Insufficient documentation

## 2016-04-17 DIAGNOSIS — Z7982 Long term (current) use of aspirin: Secondary | ICD-10-CM | POA: Insufficient documentation

## 2016-04-17 DIAGNOSIS — I4891 Unspecified atrial fibrillation: Secondary | ICD-10-CM | POA: Insufficient documentation

## 2016-04-17 DIAGNOSIS — F17210 Nicotine dependence, cigarettes, uncomplicated: Secondary | ICD-10-CM | POA: Insufficient documentation

## 2016-04-17 HISTORY — PX: CYSTOSCOPY W/ RETROGRADES: SHX1426

## 2016-04-17 SURGERY — CYSTOSCOPY, WITH RETROGRADE PYELOGRAM
Anesthesia: General | Site: Bladder | Laterality: Bilateral

## 2016-04-17 MED ORDER — LIDOCAINE HCL (PF) 1 % IJ SOLN
INTRAMUSCULAR | Status: AC
  Filled 2016-04-17: qty 5

## 2016-04-17 MED ORDER — PROPOFOL 10 MG/ML IV BOLUS
INTRAVENOUS | Status: DC | PRN
  Administered 2016-04-17: 150 mg via INTRAVENOUS

## 2016-04-17 MED ORDER — CEFAZOLIN IN D5W 1 GM/50ML IV SOLN
INTRAVENOUS | Status: AC
  Filled 2016-04-17: qty 50

## 2016-04-17 MED ORDER — CEFAZOLIN IN D5W 1 GM/50ML IV SOLN
1.0000 g | INTRAVENOUS | Status: AC
  Administered 2016-04-17: 1 g via INTRAVENOUS

## 2016-04-17 MED ORDER — MIDAZOLAM HCL 2 MG/2ML IJ SOLN
0.5000 mg | INTRAMUSCULAR | Status: DC | PRN
  Administered 2016-04-17: 2 mg via INTRAVENOUS

## 2016-04-17 MED ORDER — PROPOFOL 10 MG/ML IV BOLUS
INTRAVENOUS | Status: AC
  Filled 2016-04-17: qty 40

## 2016-04-17 MED ORDER — LACTATED RINGERS IV SOLN
INTRAVENOUS | Status: DC
  Administered 2016-04-17: 11:00:00 via INTRAVENOUS

## 2016-04-17 MED ORDER — SODIUM CHLORIDE 0.9 % IJ SOLN
INTRAMUSCULAR | Status: AC
  Filled 2016-04-17: qty 10

## 2016-04-17 MED ORDER — FENTANYL CITRATE (PF) 100 MCG/2ML IJ SOLN: 25.0000 ug | INTRAMUSCULAR | Status: DC | PRN

## 2016-04-17 MED ORDER — ONDANSETRON HCL 4 MG/2ML IJ SOLN
INTRAMUSCULAR | Status: AC
  Filled 2016-04-17: qty 2

## 2016-04-17 MED ORDER — LACTATED RINGERS IV SOLN: INTRAVENOUS | Status: DC | PRN

## 2016-04-17 MED ORDER — MIDAZOLAM HCL 2 MG/2ML IJ SOLN
INTRAMUSCULAR | Status: AC
  Filled 2016-04-17: qty 2

## 2016-04-17 MED ORDER — ONDANSETRON HCL 4 MG/2ML IJ SOLN
4.0000 mg | Freq: Once | INTRAMUSCULAR | Status: AC
  Administered 2016-04-17: 4 mg via INTRAVENOUS

## 2016-04-17 MED ORDER — STERILE WATER FOR IRRIGATION IR SOLN
Status: DC | PRN
  Administered 2016-04-17: 3000 mL

## 2016-04-17 MED ORDER — SUCCINYLCHOLINE CHLORIDE 20 MG/ML IJ SOLN
INTRAMUSCULAR | Status: AC
  Filled 2016-04-17: qty 1

## 2016-04-17 MED ORDER — FENTANYL CITRATE (PF) 100 MCG/2ML IJ SOLN
INTRAMUSCULAR | Status: DC | PRN
  Administered 2016-04-17: 50 ug via INTRAVENOUS

## 2016-04-17 MED ORDER — DIATRIZOATE MEGLUMINE 30 % UR SOLN
URETHRAL | Status: DC | PRN
  Administered 2016-04-17: 30 mL

## 2016-04-17 MED ORDER — LIDOCAINE HCL (CARDIAC) 20 MG/ML IV SOLN
INTRAVENOUS | Status: DC | PRN
  Administered 2016-04-17: 50 mg via INTRAVENOUS

## 2016-04-17 MED ORDER — EPHEDRINE SULFATE 50 MG/ML IJ SOLN
INTRAMUSCULAR | Status: AC
  Filled 2016-04-17: qty 1

## 2016-04-17 MED ORDER — FENTANYL CITRATE (PF) 100 MCG/2ML IJ SOLN
INTRAMUSCULAR | Status: AC
  Filled 2016-04-17: qty 2

## 2016-04-17 MED ORDER — DIATRIZOATE MEGLUMINE 30 % UR SOLN
URETHRAL | Status: AC
  Filled 2016-04-17: qty 300

## 2016-04-17 SURGICAL SUPPLY — 24 items
BAG DRAIN URO TABLE W/ADPT NS (DRAPE) ×3 IMPLANT
BAG DRN 8 ADPR NS SKTRN CSTL (DRAPE) ×2
BAG HAMPER (MISCELLANEOUS) ×3 IMPLANT
CATH INTERMIT  6FR 70CM (CATHETERS) ×3 IMPLANT
CLOTH BEACON ORANGE TIMEOUT ST (SAFETY) ×3 IMPLANT
EXTRACTOR STONE NITINOL NGAGE (UROLOGICAL SUPPLIES) IMPLANT
GLOVE BIO SURGEON STRL SZ7.5 (GLOVE) ×3 IMPLANT
GLOVE BIO SURGEON STRL SZ8 (GLOVE) ×3 IMPLANT
GOWN STRL REUS W/ TWL LRG LVL3 (GOWN DISPOSABLE) ×4 IMPLANT
GOWN STRL REUS W/ TWL XL LVL3 (GOWN DISPOSABLE) ×2 IMPLANT
GOWN STRL REUS W/TWL LRG LVL3 (GOWN DISPOSABLE) ×6
GOWN STRL REUS W/TWL XL LVL3 (GOWN DISPOSABLE) ×3
GUIDEWIRE STR DUAL SENSOR (WIRE) ×3 IMPLANT
GUIDEWIRE STR ZIPWIRE 035X150 (MISCELLANEOUS) ×1 IMPLANT
IV NS IRRIG 3000ML ARTHROMATIC (IV SOLUTION) ×1 IMPLANT
KIT ROOM TURNOVER AP CYSTO (KITS) ×3 IMPLANT
MANIFOLD NEPTUNE II (INSTRUMENTS) ×3 IMPLANT
NDL HYPO 18GX1.5 BLUNT FILL (NEEDLE) ×1 IMPLANT
NEEDLE HYPO 18GX1.5 BLUNT FILL (NEEDLE) ×3 IMPLANT
PACK CYSTO (CUSTOM PROCEDURE TRAY) ×3 IMPLANT
PAD ARMBOARD 7.5X6 YLW CONV (MISCELLANEOUS) ×3 IMPLANT
PAD TELFA 3X4 1S STER (GAUZE/BANDAGES/DRESSINGS) ×3 IMPLANT
WATER STERILE IRR 3000ML UROMA (IV SOLUTION) ×3 IMPLANT
WATER STERILE IRR 500ML POUR (IV SOLUTION) ×3 IMPLANT

## 2016-04-17 NOTE — Discharge Instructions (Signed)
Cystoscopy, Care After  Refer to this sheet in the next few weeks. These instructions provide you with information about caring for yourself after your procedure. Your health care provider may also give you more specific instructions. Your treatment has been planned according to current medical practices, but problems sometimes occur. Call your health care provider if you have any problems or questions after your procedure.  What can I expect after the procedure?  After the procedure, it is common to have:   Mild pain when you urinate. Pain should stop within a few minutes after you urinate. This may last for up to 1 week.   A small amount of blood in your urine for several days.   Feeling like you need to urinate but producing only a small amount of urine.    Follow these instructions at home:       Medicines    Take over-the-counter and prescription medicines only as told by your health care provider.   If you were prescribed an antibiotic medicine, take it as told by your health care provider. Do not stop taking the antibiotic even if you start to feel better.  General instructions        Return to your normal activities as told by your health care provider. Ask your health care provider what activities are safe for you.   Do not drive for 24 hours if you received a sedative.   Watch for any blood in your urine. If the amount of blood in your urine increases, call your health care provider.   Follow instructions from your health care provider about eating or drinking restrictions.   If a tissue sample was removed for testing (biopsy) during your procedure, it is your responsibility to get your test results. Ask your health care provider or the department performing the test when your results will be ready.   Drink enough fluid to keep your urine clear or pale yellow.   Keep all follow-up visits as told by your health care provider. This is important.  Contact a health care provider if:   You have  pain that gets worse or does not get better with medicine, especially pain when you urinate.   You have difficulty urinating.  Get help right away if:   You have more blood in your urine.   You have blood clots in your urine.   You have abdominal pain.   You have a fever or chills.   You are unable to urinate.  This information is not intended to replace advice given to you by your health care provider. Make sure you discuss any questions you have with your health care provider.  Document Released: 09/21/2004 Document Revised: 08/10/2015 Document Reviewed: 01/19/2015  Elsevier Interactive Patient Education  2017 [employer]

## 2016-04-17 NOTE — Brief Op Note (Signed)
04/17/2016  11:56 AM  PATIENT:  Brenda King  56 y.o. female  PRE-OPERATIVE DIAGNOSIS:  gross hematuria  POST-OPERATIVE DIAGNOSIS:  gross hematuria  PROCEDURE:  Procedure(s): CYSTOSCOPY WITH BILATERAL RETROGRADE PYELOGRAMS (Bilateral) CYSTOSCOPY WITH POSSIBLE BLADDER BIOPSY (N/A)  SURGEON:  Surgeon(s) and Role:    * Cleon Gustin, MD - Primary  PHYSICIAN ASSISTANT:   ASSISTANTS: none   ANESTHESIA:   general  EBL:  Total I/O In: 300 [I.V.:300] Out: 0   BLOOD ADMINISTERED:none  DRAINS: none   LOCAL MEDICATIONS USED:  NONE  SPECIMEN:  No Specimen  DISPOSITION OF SPECIMEN:  N/A  COUNTS:  YES  TOURNIQUET:  * No tourniquets in log *  DICTATION: .Note written in EPIC  PLAN OF CARE: Discharge to home after PACU  PATIENT DISPOSITION:  PACU - hemodynamically stable.   Delay start of Pharmacological VTE agent (>24hrs) due to surgical blood loss or risk of bleeding: not applicable

## 2016-04-17 NOTE — Addendum Note (Signed)
Addendum  created 04/17/16 1322 by Mickel Baas, CRNA   Anesthesia Intra Meds edited

## 2016-04-17 NOTE — Transfer of Care (Signed)
Immediate Anesthesia Transfer of Care Note  Patient: Brenda King  Procedure(s) Performed: Procedure(s): CYSTOSCOPY WITH BILATERAL RETROGRADE PYELOGRAMS (Bilateral)  Patient Location: PACU  Anesthesia Type:General  Level of Consciousness: awake, alert , oriented and patient cooperative  Airway & Oxygen Therapy: Patient Spontanous Breathing and Patient connected to face mask oxygen  Post-op Assessment: Report given to RN and Post -op Vital signs reviewed and stable  Post vital signs: Reviewed and stable  Last Vitals:  Vitals:   04/17/16 1120 04/17/16 1210  BP:  (!) 95/56  Pulse:    Resp: 18 12  Temp:  36.7 C    Last Pain:  Vitals:   04/17/16 1100  TempSrc: Oral      Patients Stated Pain Goal: 9 ([id number])  Complications: No apparent anesthesia complications

## 2016-04-17 NOTE — H&P (Signed)
Urology Admission H&P  Chief Complaint: gross hematuria  History of Present Illness: Ms Hanline is a 56yo with gross hematuria who did not tolerate office cystoscopy  Past Medical History:  Diagnosis Date  . Anxiety   . Atrial fibrillation [facility])    Status post ablation 2004 and 2005 - Dr. Ola Spurr [facility])  . Constipation   . Degenerative disc disease, cervical   . Menopause    Past Surgical History:  Procedure Laterality Date  . ABDOMINAL HYSTERECTOMY    . BREAST ENHANCEMENT SURGERY  2001   . CARDIAC ELECTROPHYSIOLOGY MAPPING AND ABLATION    . PARTIAL HYSTERECTOMY  2000     Home Medications:  Prescriptions Prior to Admission  Medication Sig Dispense Refill Last Dose  . ALPRAZolam (XANAX) 0.25 MG tablet Take 0.25 mg by mouth daily as needed for anxiety.    Past Month at Unknown time  . aspirin EC 81 MG tablet Take 81 mg by mouth daily.   Past Week at Unknown time  . aspirin-acetaminophen-caffeine (EXCEDRIN MIGRAINE) 250-250-65 MG tablet Take 2 tablets by mouth every 6 (six) hours as needed for headache.   04/16/2016 at Unknown time  . estradiol (VIVELLE-DOT) 0.1 MG/24HR patch Place 1 patch onto the skin 2 (two) times a week.   Past Week at Unknown time  . EVENING PRIMROSE OIL PO Take 1 capsule by mouth every evening.   04/16/2016 at Unknown time  . Multiple Vitamin (MULTIVITAMIN WITH MINERALS) TABS tablet Take 1 tablet by mouth daily.   04/16/2016 at Unknown time  . progesterone (PROMETRIUM) 100 MG capsule Take 100 mg by mouth every evening.   04/16/2016 at Unknown time  . pyridOXINE (VITAMIN B-6) 100 MG tablet Take 100 mg by mouth daily.   04/16/2016 at Unknown time  . albuterol (PROVENTIL HFA;VENTOLIN HFA) 108 (90 Base) MCG/ACT inhaler Inhale 2 puffs into the lungs every 6 (six) hours as needed for wheezing or shortness of breath. 1 Inhaler 2 Unknown at Unknown time  . azithromycin (ZITHROMAX Z-PAK) 250 MG tablet Take 2 tablets (500 mg) on  Day 1,  followed by 1 tablet (250 mg) once  daily on Days 2 through 5. (Patient not taking: Reported on 04/11/2016) 6 each 0 Not Taking at Unknown time  . diazepam (VALIUM) 5 MG tablet Take 5 mg by mouth at bedtime as needed for sedation.    More than a month at Unknown time   Allergies:  Allergies  Allergen Reactions  . Docusate Sodium Hives and Shortness Of Breath  . Miralax [Polyethylene Glycol] Hives and Shortness Of Breath  . Other Hives    ANY laxative with "LAX"  . Chlorzoxazone     Migraine headache and nausea  . Latex Itching    Family History  Problem Relation Age of Onset  . CAD Father   . Heart disease Father   . CAD Brother   . Cancer Other     breast   Social History:  reports that she has been smoking Cigarettes.  She has a 7.50 pack-year smoking history. She quit smokeless tobacco use about 3 years ago. She reports that she does not drink alcohol or use drugs.  Review of Systems  Genitourinary: Positive for hematuria.  All other systems reviewed and are negative.   Physical Exam:  Vital signs in last 24 hours:   Physical Exam  Constitutional: She is oriented to person, place, and time. She appears well-developed and well-nourished.  HENT:  Head: Normocephalic and atraumatic.  Eyes: EOM  are normal. Pupils are equal, round, and reactive to light.  Neck: Normal range of motion. No thyromegaly present.  Cardiovascular: Normal rate and regular rhythm.   Respiratory: Effort normal. No respiratory distress.  GI: Soft. She exhibits no distension.  Musculoskeletal: Normal range of motion. She exhibits no edema.  Neurological: She is alert and oriented to person, place, and time.  Skin: Skin is warm and dry.  Psychiatric: She has a normal mood and affect. Her behavior is normal. Judgment and thought content normal.    Laboratory Data:  No results found for this or any previous visit (from the past 24 hour(s)). No results found for this or any previous visit (from the past 240  hour(s)). Creatinine:  Recent Labs  04/15/16 0919  CREATININE 0.69   Baseline Creatinine: 0.7  Impression/Assessment:  55yo with gross hematuria  Plan:  The risks/benefits/alternatives to cystoscopy with possible bladder biopsy was explained to the patient and she understands and wishes to proceed with surgery  Nicolette Bang 04/17/2016, 11:05 AM

## 2016-04-17 NOTE — Anesthesia Procedure Notes (Signed)
Procedure Name: LMA Insertion Date/Time: 04/17/2016 11:31 AM Performed by: Andree Elk, Vicky Mccanless A Pre-anesthesia Checklist: Patient identified, Timeout performed, Emergency Drugs available, Suction available and Patient being monitored Patient Re-evaluated:Patient Re-evaluated prior to inductionOxygen Delivery Method: Circle system utilized Preoxygenation: Pre-oxygenation with 100% oxygen Intubation Type: IV induction Ventilation: Mask ventilation without difficulty LMA: LMA inserted LMA Size: 4.0 Number of attempts: 1 Placement Confirmation: positive ETCO2 and breath sounds checked- equal and bilateral Tube secured with: Tape Dental Injury: Teeth and Oropharynx as per pre-operative assessment

## 2016-04-17 NOTE — Anesthesia Preprocedure Evaluation (Signed)
Anesthesia Evaluation  Patient identified by MRN, date of birth, ID band Patient awake    Reviewed: Allergy & Precautions, NPO status , Patient's Chart, lab work & pertinent test results  Airway Mallampati: I  TM Distance: >3 FB     Dental  (+) Teeth Intact   Pulmonary Current Smoker,    breath sounds clear to auscultation       Cardiovascular + dysrhythmias ( Status post ablation 2004 and 2005) Atrial Fibrillation  Rhythm:Regular Rate:Normal     Neuro/Psych Anxiety    GI/Hepatic negative GI ROS,   Endo/Other    Renal/GU      Musculoskeletal  (+) Arthritis ,   Abdominal   Peds  Hematology   Anesthesia Other Findings   Reproductive/Obstetrics                            Anesthesia Physical Anesthesia Plan  ASA: II  Anesthesia Plan: General   Post-op Pain Management:    Induction: Intravenous  Airway Management Planned: LMA  Additional Equipment:   Intra-op Plan:   Post-operative Plan: Extubation in OR  Informed Consent: I have reviewed the patients History and Physical, chart, labs and discussed the procedure including the risks, benefits and alternatives for the proposed anesthesia with the patient or authorized representative who has indicated his/her understanding and acceptance.     Plan Discussed with:   Anesthesia Plan Comments:         Anesthesia Quick Evaluation

## 2016-04-17 NOTE — Anesthesia Postprocedure Evaluation (Signed)
Anesthesia Post Note  Patient: Brenda King  Procedure(s) Performed: Procedure(s) (LRB): CYSTOSCOPY WITH BILATERAL RETROGRADE PYELOGRAMS (Bilateral)  Patient location during evaluation: PACU Anesthesia Type: General Level of consciousness: awake and alert and oriented Pain management: pain level controlled Vital Signs Assessment: post-procedure vital signs reviewed and stable Respiratory status: spontaneous breathing Cardiovascular status: stable Postop Assessment: no signs of nausea or vomiting Anesthetic complications: no     Last Vitals:  Vitals:   04/17/16 1120 04/17/16 1210  BP:  (!) 95/56  Pulse:    Resp: 18 12  Temp:  36.7 C    Last Pain:  Vitals:   04/17/16 1100  TempSrc: Oral                 Alleyah Twombly A

## 2016-04-18 ENCOUNTER — Encounter (HOSPITAL_COMMUNITY): Payer: Self-pay | Admitting: Urology

## 2016-04-24 ENCOUNTER — Ambulatory Visit (INDEPENDENT_AMBULATORY_CARE_PROVIDER_SITE_OTHER): Payer: BLUE CROSS/BLUE SHIELD | Admitting: Urology

## 2016-04-24 ENCOUNTER — Ambulatory Visit: Payer: BLUE CROSS/BLUE SHIELD | Admitting: Urology

## 2016-04-24 DIAGNOSIS — R310 Gross hematuria: Secondary | ICD-10-CM

## 2016-04-29 NOTE — Op Note (Signed)
Preoperative diagnosis: gross hematuria  Postoperative diagnosis: same  Procedure: 1 cystoscopy  Attending: Nicolette Bang  Anesthesia: General  Estimated blood loss: Minimal  Drains: none  Specimens: none  Antibiotics: ancef  Findings:  Ureteral orifices in normal anatomic location.  No tumors/lesions in the bladder  Indications: Patient is a 57 year old female with a history of gross hematuria and could not tolerate office cystoscopy as part of her hematuria workup.  After discussing treatment options, they decided proceed with cycystoscopy.  Procedure her in detail: The patient was brought to the operating room and a brief timeout was done to ensure correct patient, correct procedure, correct site.  General anesthesia was administered patient was placed in dorsal lithotomy position.  Their genitalia was then prepped and draped in usual sterile fashion.  A rigid 76 French cystoscope was passed in the urethra and the bladder.  Bladder was inspected and we noted no masses or lesions.   The bladder was then drained and this concluded the procedure which was well tolerated by patient.  Complications: None  Condition: Stable, extubated, transferred to PACU  Plan: Patient is to be discharge home. she will followup in 2 weeks.

## 2016-05-02 ENCOUNTER — Telehealth: Payer: Self-pay | Admitting: Family Medicine

## 2016-05-02 NOTE — Telephone Encounter (Signed)
Pt's husband called requesting an appointment for right arm pain that's progressively worse x3 weeks  I discussed case with the nurse, and was advised that the open regular appointment for Tuesday 2/20 would be fine  Pt's husband states that won't work, pt has recently started a new job x2 weeks and can't miss any days or will be fired  Asked for a 3:30 or later - 1st available is 3:20 with Hoyle Sauer on 05/22/16 Pt's husband very upset and dissatisfied with this, states he don't understand why she can't be worked in before then  [employer] that the later afternoon appointments fill up quick and we are only allowed to put a certain number in the schedule  States he's been a patient here since the "doctors came to town" and that he'll "just call Richardson Landry at home because I have his number and we are friends and I've played tennis with Nicki Reaper"  Tried to advise urgent care in [place] or [place], he states this is unacceptable  Tried hard to explain and apologize for the scheduling issue, he was still not happy at all

## 2016-05-02 NOTE — Telephone Encounter (Addendum)
Patient may have Tuesday at 3:30pm- Appointment scheduled

## 2016-05-07 ENCOUNTER — Ambulatory Visit (HOSPITAL_COMMUNITY)
Admission: RE | Admit: 2016-05-07 | Discharge: 2016-05-07 | Disposition: A | Discharge status: Home / Self Care | Payer: BLUE CROSS/BLUE SHIELD | Source: Ambulatory Visit | Attending: Family Medicine | Admitting: Family Medicine

## 2016-05-07 ENCOUNTER — Encounter: Payer: Self-pay | Admitting: Family Medicine

## 2016-05-07 ENCOUNTER — Ambulatory Visit (INDEPENDENT_AMBULATORY_CARE_PROVIDER_SITE_OTHER): Payer: BLUE CROSS/BLUE SHIELD | Admitting: Family Medicine

## 2016-05-07 VITALS — BP 118/86 | Temp 97.9°F | Ht 70.0 in | Wt 151.1 lb

## 2016-05-07 DIAGNOSIS — M25511 Pain in right shoulder: Secondary | ICD-10-CM | POA: Insufficient documentation

## 2016-05-07 DIAGNOSIS — M7541 Impingement syndrome of right shoulder: Secondary | ICD-10-CM | POA: Diagnosis not present

## 2016-05-07 MED ORDER — ETODOLAC 400 MG PO TABS: 400.0000 mg | ORAL_TABLET | Freq: Two times a day (BID) | ORAL | 0 refills | Status: DC

## 2016-05-07 MED ORDER — TIZANIDINE HCL 4 MG PO TABS
4.0000 mg | ORAL_TABLET | Freq: Three times a day (TID) | ORAL | 0 refills | Status: DC | PRN
Start: 2016-05-07 — End: 2016-12-17

## 2016-05-07 NOTE — Progress Notes (Signed)
   Subjective:    Patient ID: Brenda King, female    DOB: 1960-11-03, 56 y.o.   MRN: [id number]  Arm Pain   The incident occurred more than 1 week ago. There was no injury mechanism. The pain is present in the upper right arm. The quality of the pain is described as aching. The pain does not radiate. The pain is moderate. The pain has been constant since the incident. The symptoms are aggravated by movement. She has tried NSAIDs for the symptoms. The treatment provided no relief.   Started five weeks   Woke up very painful  Sharp a pain at times  Wakes pt up at night tine   Took aleave otc  meds  Patient recalls no sudden injury. Has had no major problems with shoulder in the past. Aleve not really helping. Compromising her ability to do the things she wants to do  Deep aches in shoulder   Review of Systems No headache, no major weight loss or weight gain, no chest pain no back pain abdominal pain no change in bowel habits complete ROS otherwise negative     Objective:   Physical Exam Alert vitals stable, NAD. Blood pressure good on repeat. HEENT normal. Lungs clear. Heart regular rate and rhythm. Positive impingement sign on right shoulder. Inability to completely abduct arm.       Assessment & Plan:  Impression probable right shoulder impingement with now early secondary frozen shoulder plan anti-inflammatory medicine prescribed. Codman's exercise discussed. Shoulder x-ray. Orthopedic referral rationale discussed with patient. Patient had numerous questions about her situation which led to 25 minutes spent with patient. Rare than 50% of time was in counseling discussion of potential etiologies potential interventions to be offered by the orthopedist along with our interventions exercises and following up on x-ray result WSL WSL

## 2016-05-07 NOTE — Patient Instructions (Signed)
Probable rotator cuff injury  Also frozen shoulder which can become areal problem long term  We will xry shoulder and start two precription meds but deifinitely need an orthosedic surgeon to run with this  Would rec [place] orthopedist

## 2016-05-08 ENCOUNTER — Encounter: Payer: Self-pay | Admitting: Family Medicine

## 2016-08-21 ENCOUNTER — Ambulatory Visit (INDEPENDENT_AMBULATORY_CARE_PROVIDER_SITE_OTHER): Payer: BLUE CROSS/BLUE SHIELD | Admitting: Urology

## 2016-08-21 DIAGNOSIS — R3121 Asymptomatic microscopic hematuria: Secondary | ICD-10-CM | POA: Diagnosis not present

## 2016-10-14 ENCOUNTER — Telehealth: Payer: Self-pay | Admitting: Family Medicine

## 2016-10-14 ENCOUNTER — Other Ambulatory Visit: Payer: Self-pay | Admitting: Nurse Practitioner

## 2016-10-14 MED ORDER — SUMATRIPTAN SUCCINATE 50 MG PO TABS: ORAL_TABLET | ORAL | 0 refills | Status: DC

## 2016-10-14 NOTE — Telephone Encounter (Signed)
Pt is requesting a refill on Imitrex. Pt states that she has gotten from [place] in the past. Pt is in the bed with migraine.    [facility]

## 2016-10-14 NOTE — Telephone Encounter (Signed)
Patient notified

## 2016-10-14 NOTE — Telephone Encounter (Signed)
Done. Office visit or ED if no improvement.

## 2016-12-17 ENCOUNTER — Encounter: Payer: Self-pay | Admitting: Family Medicine

## 2016-12-17 ENCOUNTER — Ambulatory Visit (INDEPENDENT_AMBULATORY_CARE_PROVIDER_SITE_OTHER): Payer: BLUE CROSS/BLUE SHIELD | Admitting: Family Medicine

## 2016-12-17 VITALS — BP 110/78 | Ht 70.0 in | Wt 151.0 lb

## 2016-12-17 DIAGNOSIS — M7541 Impingement syndrome of right shoulder: Secondary | ICD-10-CM | POA: Diagnosis not present

## 2016-12-17 DIAGNOSIS — R319 Hematuria, unspecified: Secondary | ICD-10-CM | POA: Diagnosis not present

## 2016-12-17 DIAGNOSIS — Z13220 Encounter for screening for lipoid disorders: Secondary | ICD-10-CM | POA: Diagnosis not present

## 2016-12-17 DIAGNOSIS — Z79899 Other long term (current) drug therapy: Secondary | ICD-10-CM

## 2016-12-17 DIAGNOSIS — Z1329 Encounter for screening for other suspected endocrine disorder: Secondary | ICD-10-CM | POA: Diagnosis not present

## 2016-12-17 LAB — POCT URINALYSIS DIPSTICK
Spec Grav, UA: <=1.005 — AB (ref 1.010–1.025)
pH, UA: 5 (ref 5.0–8.0)

## 2016-12-17 NOTE — Progress Notes (Signed)
   Subjective:    Patient ID: RECHY BOST, female    DOB: April 01, 1960, 56 y.o.   MRN: [id number] Patient arrives office with numerous concerns HPI Decreased range of motion left shoulder, locking up on her since January 2018. She is in pain daily. They would like to know what is going on as she had the same symptoms with her right arm,and had surgery on it   Progressive challenges left shoulder. Difficulty with extending. Progressive pain. Had rotator cuff right surgery earlier this year. Pain is sharp aching at times radiates from shoulder to elbow  Also high frustration because of ongoing presence of microscopic hematuria. And on occasion macroscopic. Had a urology workup in 2011 and now having what appears to be an excellent workup in 2018. Patient expresses frustration this regard.  Patient also notes fatigue and tiredness worse with a progressive months. Accompanied by diminished energy   .They would like all the blood work you can think of as they have good insurance.Still having blood in urine.She has been seen by Urology Dr.Mckenzie in the past and she sees him again in Dec.   Dr Tamera Punt tried injections if not bdtter, down the road may need another injection    [place] rot [place] of the right [place]  Had injec in the shoulder in the join a few wks ago,, Review of Systems No headache, no major weight loss or weight gain, no chest pain no back pain abdominal pain no change in bowel habits complete ROS otherwise negative     Objective:   Physical Exam  Alert and oriented, vitals reviewed and stable, NAD ENT-TM's and ext canals WNL bilat via otoscopic exam Soft palate, tonsils and post pharynx WNL via oropharyngeal exam Neck-symmetric, no masses; thyroid nonpalpable and nontender Pulmonary-no tachypnea or accessory muscle use; Clear without wheezes via auscultation Card--no abnrml murmurs, rhythm reg and rate WNL Carotid pulses symmetric, without  bruits Eft shoulder positive impingement sign      Assessment & Plan:  Impression 1 left shoulder impingement discussed/multiple options discussed with patient from [place] to stick with current orthopedic surgeon which I think is a good idea  #2 microscopic hematuria. Patient educated further on this. Today's urine specin reveals nothing n this regard  #3 fatigu,patient concerned there must be somethi "metabolic" wrong with her. Requests blood work.  Greater than 50% of this 25 minute face to face visit was spent in counseling and discussion and coordination of care regarding the above diagnosis/diagnosies

## 2016-12-20 ENCOUNTER — Ambulatory Visit (INDEPENDENT_AMBULATORY_CARE_PROVIDER_SITE_OTHER): Payer: BLUE CROSS/BLUE SHIELD

## 2016-12-20 DIAGNOSIS — Z23 Encounter for immunization: Secondary | ICD-10-CM

## 2016-12-21 LAB — HEPATIC FUNCTION PANEL
ALK PHOS: 53 IU/L (ref 39–117)
ALT: 16 IU/L (ref 0–32)
AST: 17 IU/L (ref 0–40)
Albumin: 4.1 g/dL (ref 3.5–5.5)
BILIRUBIN, DIRECT: 0.08 mg/dL (ref 0.00–0.40)
Bilirubin Total: <0.2 mg/dL (ref 0.0–1.2)
Total Protein: 6.1 g/dL (ref 6.0–8.5)

## 2016-12-21 LAB — BASIC METABOLIC PANEL
BUN/Creatinine Ratio: 24 — ABNORMAL HIGH (ref 9–23)
BUN: 17 mg/dL (ref 6–24)
CALCIUM: 9 mg/dL (ref 8.7–10.2)
CO2: 25 mmol/L (ref 20–29)
CREATININE: 0.7 mg/dL (ref 0.57–1.00)
Chloride: 106 mmol/L (ref 96–106)
GFR calc Af Amer: 114 mL/min/{1.73_m2} (ref >59)
GFR, EST NON AFRICAN AMERICAN: 99 mL/min/{1.73_m2} (ref >59)
Glucose: 93 mg/dL (ref 65–99)
POTASSIUM: 4.8 mmol/L (ref 3.5–5.2)
Sodium: 142 mmol/L (ref 134–144)

## 2016-12-21 LAB — CBC WITH DIFFERENTIAL/PLATELET
BASOS: 0 %
Basophils Absolute: 0 10*3/uL (ref 0.0–0.2)
EOS (ABSOLUTE): 0.2 10*3/uL (ref 0.0–0.4)
EOS: 3 %
HEMATOCRIT: 39.9 % (ref 34.0–46.6)
HEMOGLOBIN: 13.6 g/dL (ref 11.1–15.9)
Immature Grans (Abs): 0 10*3/uL (ref 0.0–0.1)
Immature Granulocytes: 0 %
LYMPHS ABS: 1.9 10*3/uL (ref 0.7–3.1)
Lymphs: 35 %
MCH: 32.7 pg (ref 26.6–33.0)
MCHC: 34.1 g/dL (ref 31.5–35.7)
MCV: 96 fL (ref 79–97)
MONOCYTES: 8 %
Monocytes Absolute: 0.4 10*3/uL (ref 0.1–0.9)
NEUTROS ABS: 3 10*3/uL (ref 1.4–7.0)
Neutrophils: 54 %
Platelets: 301 10*3/uL (ref 150–379)
RBC: 4.16 x10E6/uL (ref 3.77–5.28)
RDW: 12.9 % (ref 12.3–15.4)
WBC: 5.5 10*3/uL (ref 3.4–10.8)

## 2016-12-21 LAB — TSH: TSH: 1.46 u[IU]/mL (ref 0.450–4.500)

## 2016-12-21 LAB — MAGNESIUM: Magnesium: 2 mg/dL (ref 1.6–2.3)

## 2016-12-21 LAB — LIPID PANEL
CHOLESTEROL TOTAL: 161 mg/dL (ref 100–199)
Chol/HDL Ratio: 2.6 ratio (ref 0.0–4.4)
HDL: 61 mg/dL (ref >39)
LDL CALC: 88 mg/dL (ref 0–99)
Triglycerides: 60 mg/dL (ref 0–149)
VLDL Cholesterol Cal: 12 mg/dL (ref 5–40)

## 2017-01-28 ENCOUNTER — Other Ambulatory Visit: Payer: Self-pay | Admitting: Urology

## 2017-01-28 DIAGNOSIS — R3121 Asymptomatic microscopic hematuria: Secondary | ICD-10-CM

## 2017-02-19 ENCOUNTER — Ambulatory Visit: Payer: BLUE CROSS/BLUE SHIELD | Admitting: Urology

## 2017-03-03 ENCOUNTER — Encounter: Payer: Self-pay | Admitting: Family Medicine

## 2017-03-03 ENCOUNTER — Ambulatory Visit: Payer: BLUE CROSS/BLUE SHIELD | Admitting: Family Medicine

## 2017-03-03 VITALS — BP 122/76 | Temp 98.8°F | Ht 70.0 in | Wt 151.0 lb

## 2017-03-03 DIAGNOSIS — B349 Viral infection, unspecified: Secondary | ICD-10-CM

## 2017-03-03 DIAGNOSIS — J0110 Acute frontal sinusitis, unspecified: Secondary | ICD-10-CM

## 2017-03-03 MED ORDER — CEFDINIR 300 MG PO CAPS
300.0000 mg | ORAL_CAPSULE | Freq: Two times a day (BID) | ORAL | 0 refills | Status: DC
Start: 2017-03-03 — End: 2017-06-02

## 2017-03-03 MED ORDER — ONDANSETRON 4 MG PO TBDP: 4.0000 mg | ORAL_TABLET | Freq: Three times a day (TID) | ORAL | 0 refills | Status: DC | PRN

## 2017-03-03 NOTE — Progress Notes (Signed)
   Subjective:    Patient ID: Brenda King, female    DOB: 1961-02-16, 56 y.o.   MRN: [id number]  Sinusitis  This is a new problem. Associated symptoms include congestion, headaches and a sore throat. (Nausea) Treatments tried: loratadine, imitrex, nyquil, goody's, tylenol sinus, excedrin migraine.    Throat and snifflinf   And bad headache  nooenergy  Severe headache  Very devilitating  Got bad almost for emergency room level  Had sone exposures , around a bunch of folks,,  No nausea meds, dim intake    Jittery and achey   Also very nauseated   Review of Systems  HENT: Positive for congestion and sore throat.   Neurological: Positive for headaches.       Objective:   Physical Exam Alert, mild malaise. Hydration good Vitals stable. frontal/ maxillary tenderness evident positive nasal congestion. pharynx normal neck supple  lungs clear/no crackles or wheezes. heart regular in rhythm        Assessment & Plan:  Impression rhinosinusitis likely post viral, discussed with patient. plan antibiotics prescribed. Questions answered. Symptomatic care discussed. warning signs discussed. WSL

## 2017-03-05 ENCOUNTER — Ambulatory Visit: Payer: BLUE CROSS/BLUE SHIELD | Admitting: Urology

## 2017-06-02 ENCOUNTER — Ambulatory Visit: Payer: BLUE CROSS/BLUE SHIELD | Admitting: Nurse Practitioner

## 2017-06-02 VITALS — BP 118/78 | Temp 98.5°F | Ht 70.0 in | Wt 153.0 lb

## 2017-06-02 DIAGNOSIS — G43009 Migraine without aura, not intractable, without status migrainosus: Secondary | ICD-10-CM | POA: Diagnosis not present

## 2017-06-02 MED ORDER — KETOROLAC TROMETHAMINE 10 MG PO TABS: 10.0000 mg | ORAL_TABLET | Freq: Four times a day (QID) | ORAL | 0 refills | Status: DC | PRN

## 2017-06-02 MED ORDER — SUMATRIPTAN SUCCINATE 50 MG PO TABS: ORAL_TABLET | ORAL | 2 refills | Status: DC

## 2017-06-03 ENCOUNTER — Encounter: Payer: Self-pay | Admitting: Nurse Practitioner

## 2017-06-03 DIAGNOSIS — G43009 Migraine without aura, not intractable, without status migrainosus: Secondary | ICD-10-CM | POA: Insufficient documentation

## 2017-06-03 NOTE — Progress Notes (Signed)
Subjective: Presents for complaints of a flareup of her migraine headaches.  Typically has 1 about every 3 months.  Her most recent one began 2 days ago.  Continues to have it today although not as intense.  Always occurs on the right side of the face.  Slight nausea, no vomiting.  No change in migraine symptomatology.  Typically will resolve with Imitrex but patient had run out of her prescription.  Does not like to take any form of injectable medication, has a fear of needles.  Objective:   BP 118/78   Temp 98.5 F (36.9 C) (Oral)   Ht 5\' 10"  (1.778 m)   Wt 153 lb (69.4 kg)   BMI 21.95 kg/m  NAD.  Alert, oriented.  Lungs clear.  Heart regular rate and rhythm.  Assessment:  Problem List Items Addressed This Visit      Cardiovascular and Mediastinum   Migraine without aura and without status migrainosus, not intractable - Primary   Relevant Medications   ketorolac (TORADOL) 10 MG tablet   SUMAtriptan (IMITREX) 50 MG tablet       Plan:   Meds ordered this encounter  Medications  . ketorolac (TORADOL) 10 MG tablet    Sig: Take 1 tablet (10 mg total) by mouth every 6 (six) hours as needed.    Dispense:  20 tablet    Refill:  0    Order Specific Question:   Supervising Provider    Answer:   Mikey Kirschner [[id number]]  . SUMAtriptan (IMITREX) 50 MG tablet    Sig: One po at onset of migraine; may repeat in 2 hrs if needed; max 2 per 24 hrs.    Dispense:  10 tablet    Refill:  2    Order Specific Question:   Supervising Provider    Answer:   Mikey Kirschner [[id number]]   Continue Excedrin Migraine as directed.  Continue Zofran as directed for nausea.  Trial of Toradol for the next couple of days to see if this will help her current migraine.  Refill Imitrex.  Warning signs reviewed.  Call back in 48 hours if no improvement, call or go to ED sooner if worse.

## 2017-10-30 IMAGING — DX DG SHOULDER 2+V*R*
3 series · 3 of 3 positions shown · non-contrast
Comparison: No recent.

CLINICAL DATA: Chronic pain.  No injury .

EXAM:
RIGHT SHOULDER - 2+ VIEW

[shoulder ap]
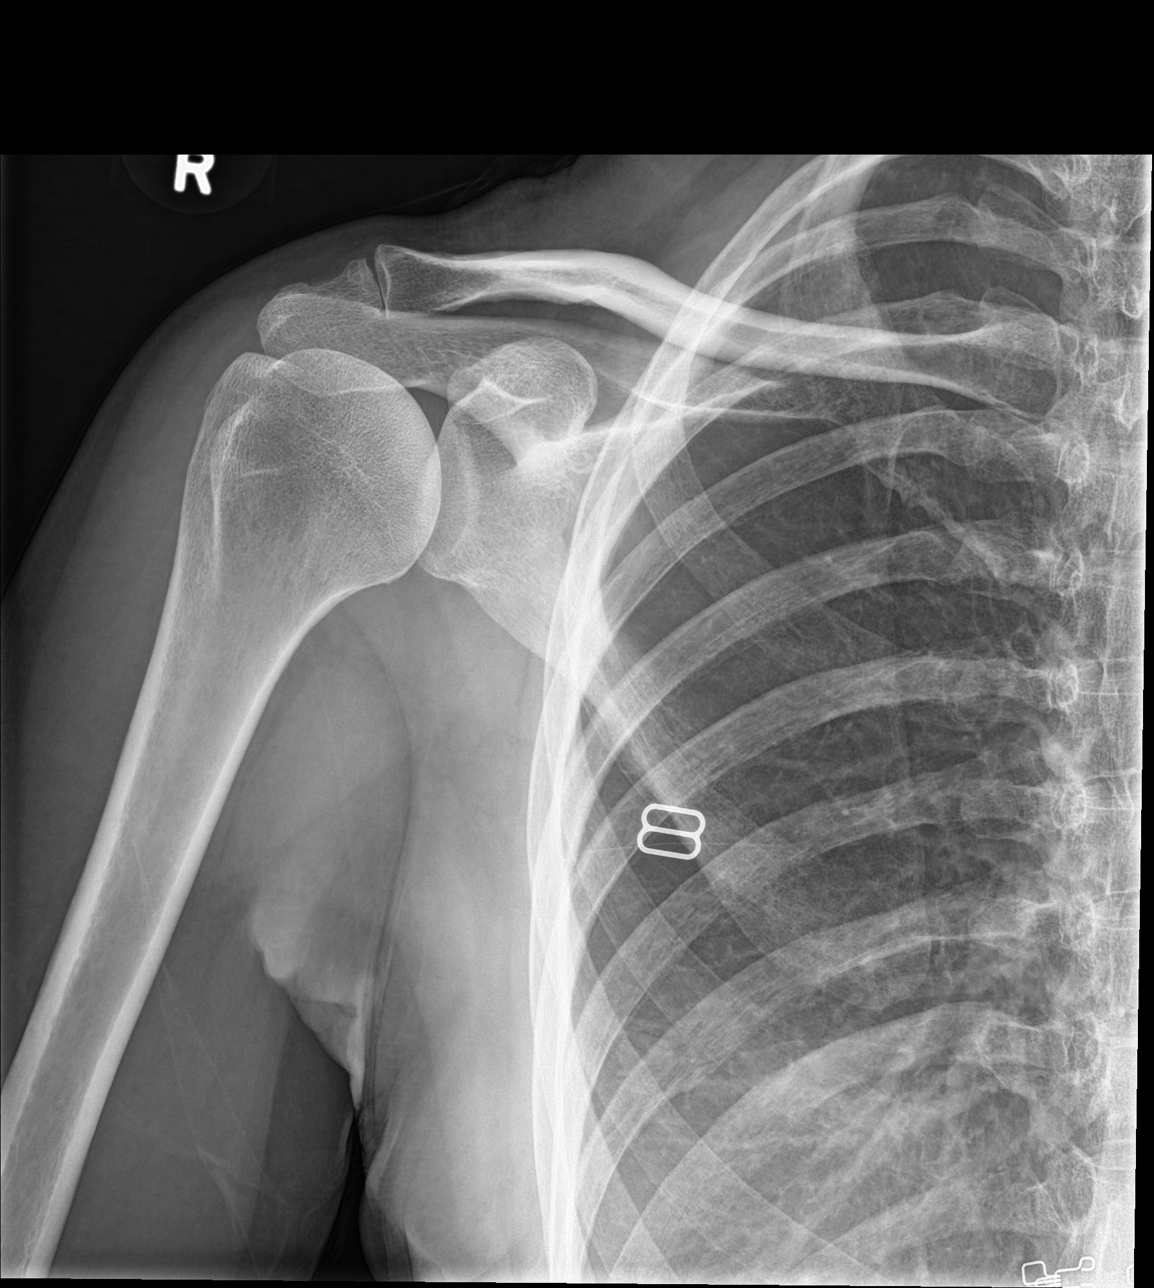

[shoulder y view]
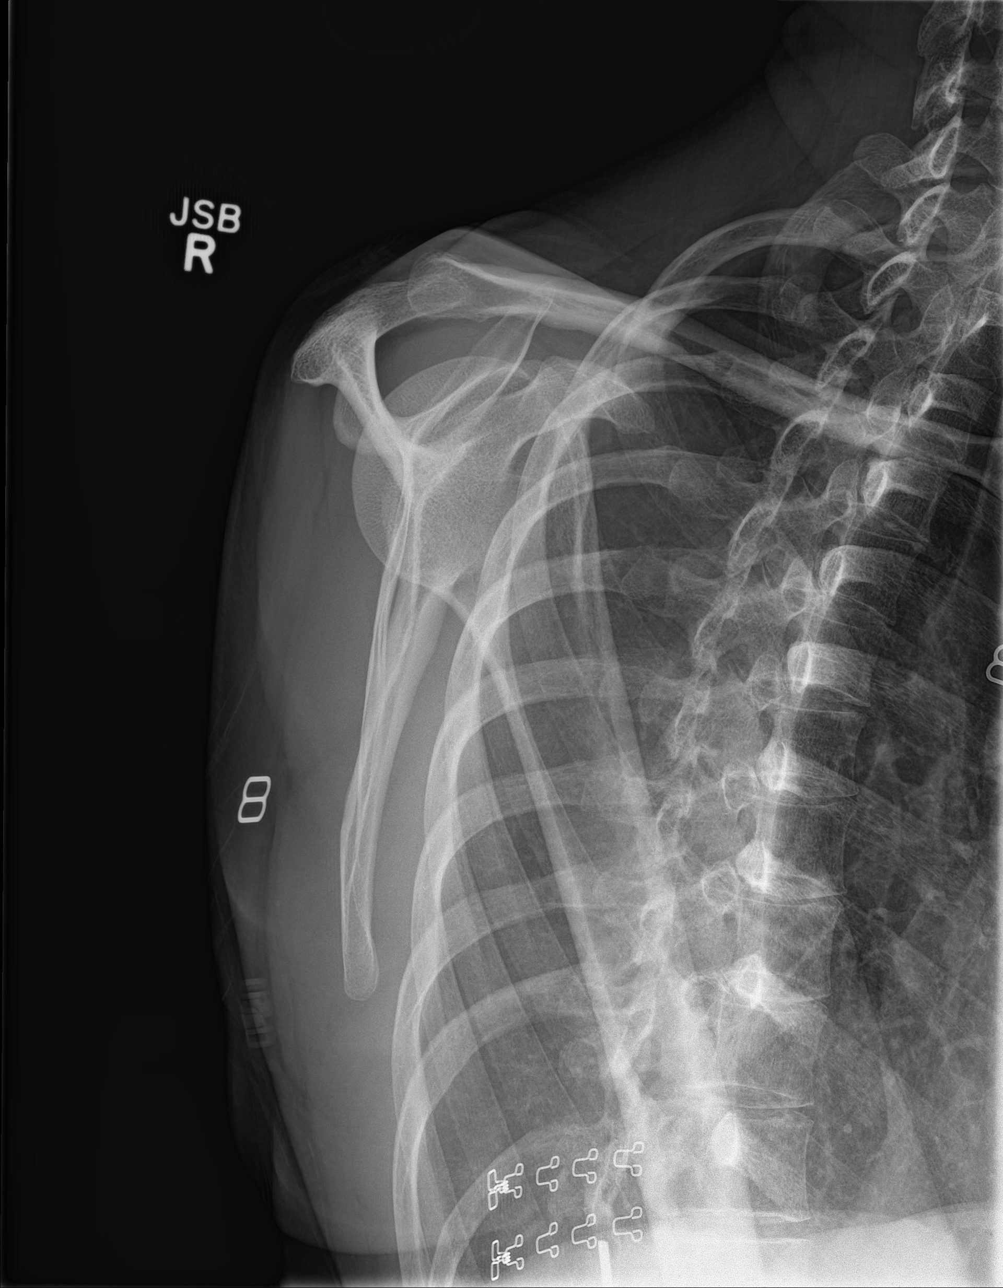

[shoulder axillary]
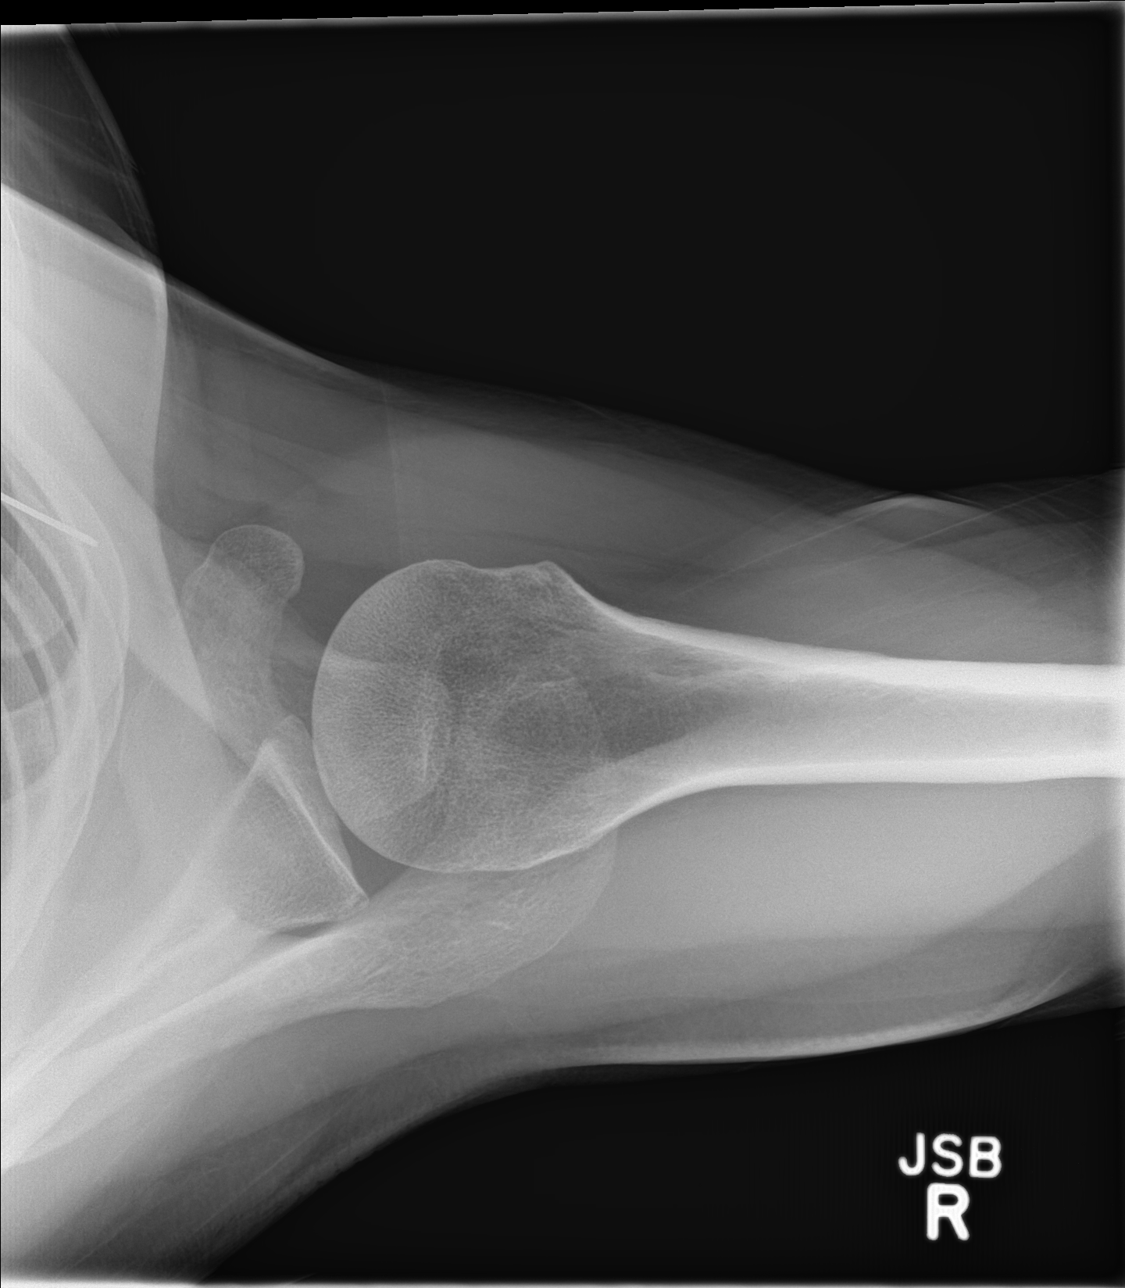

[3 of 3 positions shown; findings below may reference images not displayed]

FINDINGS: No acute bony or joint abnormality identified. Acromioclavicular and
glenohumeral degenerative change. No evidence of fracture
dislocation.
IMPRESSION: Acromioclavicular and glenohumeral degenerative change. No acute
abnormality.

## 2017-12-02 ENCOUNTER — Encounter (HOSPITAL_COMMUNITY): Payer: Self-pay | Admitting: Physical Therapy

## 2017-12-02 NOTE — Therapy (Signed)
[place], [place], [address] Phone: [phone]   Fax:  [phone]  Patient Details  Name: Brenda King MRN: [id number] Date of Birth: 10/08/1960 Referring Provider:  No ref. provider found  Encounter Date: 12/02/2017  PHYSICAL THERAPY DISCHARGE SUMMARY  Visits from Start of Care: 4  Current functional level related to goals / functional outcomes: Has not returned to PT- DC    Remaining deficits: Unable to assess    Education / Equipment: None  Plan: Patient agrees to discharge.  Patient goals were not met. Patient is being discharged due to not returning since the last visit.  ?????       Deniece Ree PT, DPT, CBIS  Supplemental Physical Therapist [facility]    Pager [phone] Acute Rehab Office [place] [address] [place], [place], [address] Phone: [phone]   Fax:  [phone]

## 2018-02-10 ENCOUNTER — Encounter: Payer: Self-pay | Admitting: Family Medicine

## 2018-02-10 ENCOUNTER — Ambulatory Visit: Payer: BLUE CROSS/BLUE SHIELD | Admitting: Family Medicine

## 2018-02-10 VITALS — BP 132/88 | HR 82 | Temp 98.7°F | Ht 70.0 in | Wt 150.0 lb

## 2018-02-10 DIAGNOSIS — J0110 Acute frontal sinusitis, unspecified: Secondary | ICD-10-CM

## 2018-02-10 DIAGNOSIS — J4520 Mild intermittent asthma, uncomplicated: Secondary | ICD-10-CM | POA: Diagnosis not present

## 2018-02-10 MED ORDER — CEFDINIR 300 MG PO CAPS: 300.0000 mg | ORAL_CAPSULE | Freq: Two times a day (BID) | ORAL | 0 refills | Status: DC

## 2018-02-10 MED ORDER — BENZONATATE 100 MG PO CAPS: 100.0000 mg | ORAL_CAPSULE | Freq: Four times a day (QID) | ORAL | 0 refills | Status: DC | PRN

## 2018-02-10 MED ORDER — ALBUTEROL SULFATE HFA 108 (90 BASE) MCG/ACT IN AERS: 2.0000 | INHALATION_SPRAY | Freq: Four times a day (QID) | RESPIRATORY_TRACT | 2 refills | Status: DC | PRN

## 2018-02-10 NOTE — Progress Notes (Signed)
   Subjective:    Patient ID: Brenda King, female    DOB: Oct 03, 1960, 57 y.o.   MRN: [id number]  HPI  Patient is here today with complaints of a productive cough and congestion. She says she was up all night with the cough from 1 am to 5 am. She did use her inhaler, tessalon perles,night quil, mucinex and humidifier.    Cough severe   Non stop  Very bad  Using mucine and nyquil and gtessonate  And cough meds    Non stop  spre nd hurting   Dry non productive  Not much coming up     husb had similar illness  Pos use of inhaler in the past,  Still smoking  But only with vape    Has cut down  smokinge , but not    Review of Systems No headache, no major weight loss or weight gain, no chest pain no back pain abdominal pain no change in bowel habits complete ROS otherwise negative     Objective:   Physical Exam  Alert, mild malaise. Hydration good Vitals stable. evident positive nasal congestion. pharynx normal neck supple  lungs clear/no crackles however positive cough with wheezes. heart regular in rhythm       Assessment & Plan:  Impression rhinosinusitis/bronchitis with element of reactive airways.  Albuterol prescribed likely post viral, discussed with patient. plan antibiotics prescribed. Questions answered. Symptomatic care discussed. warning signs discussed. WSL Strongly encouraged to stop smoking

## 2018-02-18 ENCOUNTER — Encounter

## 2018-02-18 ENCOUNTER — Telehealth: Payer: Self-pay | Admitting: Family Medicine

## 2018-02-18 NOTE — Telephone Encounter (Signed)
Left message to return call to get more info.  

## 2018-02-18 NOTE — Telephone Encounter (Signed)
Patient was seen 11/26 with sinusitis and prescribed ominicef 300 mg now she has spots on her throat. [facility]

## 2018-02-19 ENCOUNTER — Encounter: Payer: Self-pay | Admitting: Family Medicine

## 2018-02-19 ENCOUNTER — Ambulatory Visit: Payer: BLUE CROSS/BLUE SHIELD | Admitting: Family Medicine

## 2018-02-19 VITALS — BP 102/70 | Temp 98.4°F | Wt 148.0 lb

## 2018-02-19 DIAGNOSIS — B3781 Candidal esophagitis: Secondary | ICD-10-CM

## 2018-02-19 DIAGNOSIS — B370 Candidal stomatitis: Secondary | ICD-10-CM

## 2018-02-19 MED ORDER — NYSTATIN 100000 UNIT/ML MT SUSP: OROMUCOSAL | 0 refills | Status: DC

## 2018-02-19 NOTE — Telephone Encounter (Signed)
Pt had office visit today.

## 2018-02-19 NOTE — Progress Notes (Signed)
   Subjective:    Patient ID: Brenda King, female    DOB: [birth year], 57 y.o.   MRN: [id number]  HPI   Patient states she was here last week with a horrible cough. She was given inhaler, cefdinir 300 mg one bid,tesslon 100 mg one Q 6 hours prn. She states she noticed 2 days ago she had white spots all through her mouth. She has been eating yogurt to prevent a yeast infection. She is not having any issues with yeast any where else. She has been using saltwater,peroxide rinse.  Reports sinus infection symptoms are improving. Has noticed white spots to her mouth and feels like something is stuck in her throat. Not painful, just she can tell that something's there.   Not smoking currently but is now vaping.   Review of Systems  Constitutional: Negative for chills and fever.  HENT: Negative for ear pain, sinus pressure, sinus pain and trouble swallowing.   Respiratory: Positive for cough. Negative for shortness of breath and wheezing.        Objective:   Physical Exam  Constitutional: She is oriented to person, place, and time. She appears well-developed and well-nourished. No distress.  HENT:  Head: Normocephalic and atraumatic.  Right Ear: Tympanic membrane normal.  Left Ear: Tympanic membrane normal.  Nose: Nose normal.  Mouth/Throat: Uvula is midline.  Oropharynx with white patches and reddened areas noted on exam. Not painful.   Neck: Neck supple.  Cardiovascular: Normal rate, regular rhythm and normal heart sounds.  No murmur heard. Pulmonary/Chest: Effort normal and breath sounds normal. No respiratory distress.  Neurological: She is alert and oriented to person, place, and time.  Skin: Skin is warm and dry.  Psychiatric: She has a normal mood and affect.  Nursing note and vitals reviewed.     Assessment & Plan:  Thrush of mouth and esophagus ([place])  Will treat with nystatin as prescribed. Warning signs discussed, f/u if symptoms worsen or fail to improve.   Dr. Sallee Lange was consulted on this case and is in agreement with the above treatment plan.

## 2018-03-26 ENCOUNTER — Telehealth: Payer: Self-pay | Admitting: Family Medicine

## 2018-03-26 NOTE — Telephone Encounter (Signed)
Fax from pharmacy stating that they spoke with patient about asthma care and noticed the patient has multiple rescue inhaler fills without filling a controller medication. Pharmacy reaching out on behalf of patient to determine if it is appropriate to start a daily asthma controller therapy. Please advise. Thank you

## 2018-03-26 NOTE — Telephone Encounter (Signed)
noted 

## 2018-04-13 ENCOUNTER — Ambulatory Visit: Payer: BLUE CROSS/BLUE SHIELD | Admitting: Family Medicine

## 2018-04-13 ENCOUNTER — Encounter: Payer: Self-pay | Admitting: Family Medicine

## 2018-04-13 VITALS — BP 110/72 | Ht 70.0 in | Wt 152.8 lb

## 2018-04-13 DIAGNOSIS — G43709 Chronic migraine without aura, not intractable, without status migrainosus: Secondary | ICD-10-CM | POA: Diagnosis not present

## 2018-04-13 MED ORDER — TOPIRAMATE 25 MG PO TABS: ORAL_TABLET | ORAL | 3 refills | Status: DC

## 2018-04-13 NOTE — Progress Notes (Signed)
   Subjective:    Patient ID: Brenda King, female    DOB: 04/11/1960, 58 y.o.   MRN: [id number]  HPI Patient arrives with c/o headaches for 3 weeks. Patient states the headaches have never went away and at least 8 days in the last 3 weeks she was unable to even get out of bed.   few weeks had sig h  A, took iitrex sat and sun  Plus excedrin h a   mon thru Friday  Then settles don , gets mild, but not comletel going away     Aching bad and stiff post right neck   Feeling really bad, nostly staying in bed, feeling rough  Pos throbbing  Pos photophobia  Right eye puffy, and seeme ddroopy     Patient also having neck pain   Review of Systems No headache, no major weight loss or weight gain, no chest pain no back pain abdominal pain no change in bowel habits complete ROS otherwise negative     Objective:   Physical Exam  Alert and oriented, vitals reviewed and stable, NAD ENT-TM's and ext canals WNL bilat via otoscopic exam Soft palate, tonsils and post pharynx WNL via oropharyngeal exam Neck-symmetric, no masses; thyroid nonpalpable and nontender Pulmonary-no tachypnea or accessory muscle use; Clear without wheezes via auscultation Card--no abnrml murmurs, rhythm reg and rate WNL Carotid pulses symmetric, without bruits Neuro exam intact      Assessment & Plan:  Impression probable migraine headaches.  Discussed at length.  Worsening in nature.  More frequent and more severe.  More sustained.  Prophylaxis discussed.  Including primary prophylaxis.  Multiple options discussed.  Will initiate Topamax rationale discussed.  Side effects benefits discussed  Greater than 50% of this 25 minute face to face visit was spent in counseling and discussion and coordination of care regarding the above diagnosis/diagnosies

## 2018-05-14 ENCOUNTER — Ambulatory Visit: Payer: BLUE CROSS/BLUE SHIELD | Admitting: Family Medicine

## 2018-05-19 ENCOUNTER — Encounter: Payer: Self-pay | Admitting: Family Medicine

## 2018-05-24 ENCOUNTER — Telehealth: Payer: Self-pay | Admitting: Internal Medicine

## 2018-05-24 NOTE — Telephone Encounter (Signed)
Dr. Collene Mares patient Reports itching and burning skin feeling with starting GoLytely -- feels she is having an allergic reaction No cp or severe dyspnea Had colonoscopy 10 yrs ago with similar reaction to bowel prep, though thought to be the dulcolax She took some childrens benadryl Had been taking linzess after dinner x 4 days to help "get ready" for prep.  I advised she stop GoLytely and not restart.  Needs to be added to allergy list. Take 50 mg benadryl If symptoms worsen or should she develop chest pain, dyspnea, swelling in face, lips, neck, or tongue she should call 911 immediately and be seen in the ER  She very much wants to complete a prep and have this procedure done given all she has been through.  She will try Mg citrate 1 bottle + 16 oz water tonight Mg citrate 1 bottle + 16 oz water at 4 am and repeated at 5 am  NPO at 6 am, procedure scheduled for 10 am  Again to ER with any worsening symptoms, or with any respiratory or chest pain symptoms. She voiced understanding

## 2018-05-25 NOTE — Telephone Encounter (Signed)
Faxed as requested

## 2018-06-05 ENCOUNTER — Other Ambulatory Visit: Payer: Self-pay | Admitting: *Deleted

## 2018-06-07 MED ORDER — SUMATRIPTAN SUCCINATE 50 MG PO TABS: ORAL_TABLET | ORAL | 2 refills | Status: DC

## 2018-06-26 ENCOUNTER — Other Ambulatory Visit: Payer: Self-pay

## 2018-06-26 ENCOUNTER — Ambulatory Visit (INDEPENDENT_AMBULATORY_CARE_PROVIDER_SITE_OTHER): Payer: BLUE CROSS/BLUE SHIELD | Admitting: Family Medicine

## 2018-06-26 DIAGNOSIS — J029 Acute pharyngitis, unspecified: Secondary | ICD-10-CM

## 2018-06-26 MED ORDER — AZITHROMYCIN 250 MG PO TABS: ORAL_TABLET | ORAL | 0 refills | Status: DC

## 2018-06-26 NOTE — Progress Notes (Signed)
   Subjective:    Patient ID: Brenda King, female    DOB: 1960-08-16, 58 y.o.   MRN: [id number] Video plus audio 20 minutes Sinusitis  This is a new problem. Maximum temperature: low grade around 99. Associated symptoms include shortness of breath and a sore throat. Past treatments include acetaminophen (albuterol).   Virtual Visit via Telephone Note  I connected with Brenda King on 06/26/18 at 10:00 AM EDT by telephone and verified that I am speaking with the correct person using two identifiers.   I discussed the limitations, risks, security and privacy concerns of performing an evaluation and management service by telephone and the availability of in person appointments. I also discussed with the patient that there may be a patient responsible charge related to this service. The patient expressed understanding and agreed to proceed.   History of Present Illness:    Observations/Objective:   Assessment and Plan:   Follow Up Instructions:    I discussed the assessment and treatment plan with the patient. The patient was provided an opportunity to ask questions and all were answered. The patient agreed with the plan and demonstrated an understanding of the instructions.   The patient was advised to call back or seek an in-person evaluation if the symptoms worsen or if the condition fails to improve as anticipated.  I provided 20 minutes of non-face-to-face time during this encounter.  Patient notes some congestion drainage.  Sore throat.  Worse in the morning.  No cough.  Occasional wheeze.  Uses albuterol as needed    Review of Systems  HENT: Positive for sore throat.   Respiratory: Positive for shortness of breath.        Objective:   Physical Exam  Virtual exam      Assessment & Plan:  Impression rhinosinusitis/pharyngitis with mild element of exacerbation reactive airways.  Symptom care discussed albuterol given.  Antibiotics prescribed.  Doubt coronavirus  though potential for discussed

## 2018-07-10 ENCOUNTER — Other Ambulatory Visit: Payer: Self-pay

## 2018-07-10 ENCOUNTER — Ambulatory Visit (INDEPENDENT_AMBULATORY_CARE_PROVIDER_SITE_OTHER): Payer: BLUE CROSS/BLUE SHIELD | Admitting: Family Medicine

## 2018-07-10 DIAGNOSIS — F410 Panic disorder [episodic paroxysmal anxiety] without agoraphobia: Secondary | ICD-10-CM | POA: Diagnosis not present

## 2018-07-10 DIAGNOSIS — F411 Generalized anxiety disorder: Secondary | ICD-10-CM | POA: Diagnosis not present

## 2018-07-10 MED ORDER — SERTRALINE HCL 50 MG PO TABS: 50.0000 mg | ORAL_TABLET | Freq: Every day | ORAL | 3 refills | Status: DC

## 2018-07-10 MED ORDER — ALPRAZOLAM 1 MG PO TABS: ORAL_TABLET | ORAL | 0 refills | Status: DC

## 2018-07-10 NOTE — Progress Notes (Signed)
   Subjective:  Format video and audio Patient present at home Provider present at office Consent for interaction obtained Coronavirus outbreak   Patient ID: Brenda King, female    DOB: 1960-06-22, 58 y.o.   MRN: [id number]  HPI Patient calls to discuss panic attack yesterday. Patient states she felt like she couldn't get a good breath. Patient states she tried her inhaler and it made her worse. Patient states she went to the ER and waited for 45 min and left without being seen. Patient states she had to take extra xanax to calm herself down.  Virtual Visit via Video Note  I connected with [name] on 07/10/18 at  3:30 PM EDT by a video enabled telemedicine application and verified that I am speaking with the correct person using two identifiers.   I discussed the limitations of evaluation and management by telemedicine and the availability of in person appointments. The patient expressed understanding and agreed to proceed.  History of Present Illness:    Observations/Objective:   Assessment and Plan:   Follow Up Instructions:    I discussed the assessment and treatment plan with the patient. The patient was provided an opportunity to ask questions and all were answered. The patient agreed with the plan and demonstrated an understanding of the instructions.   The patient was advised to call back or seek an in-person evaluation if the symptoms worsen or if the condition fails to improve as anticipated.  I provided 17 minutes of non-face-to-face time during this encounter.   We had a good long discussion regarding her symptomatology.  She has been going through a fair amount of stress related to coronavirus.  In addition to this finding self feeling anxious and at times on edge.  There are times where she has some mild depression symptoms.  She is hopeful that things will get better over time.  She denies being suicidal.  She is able to function at home okay.  She denies  any angina symptoms it is more about chest tightness with rapid heartbeat shortness of breath when she gets anxious.  She denies sweats vomiting diarrhea.  Energy level overall okay.  Eating okay drinking okay no fevers.  We also had a discussion regarding L Medication can be used to intervene with a panic attack but does not prevent a panic attack we did discuss risk and benefits of serotonin reuptake inhibitors.  She is concerned about weight gain.  I told her that any weight gain would be minimal with this medicine and that her bigger problem is controlling the anxiety with some depression symptoms and controlling panic attacks she agrees with this and consents to trying the medication  Review of Systems     Objective:   Physical Exam        Assessment & Plan:  Panic attacks Generalized anxiety Some depression symptoms but not full-blown depression Sertraline 50 mg start off with 1/2 tablet daily for the first 5 days then 1 tablet thereafter Xanax when necessary patient gets this through her specialist but she was completely out and her specialist was not responding I did check the drug registry I issued her 20 tablets to use with caution not to drive with the medication and to talk with her specialist next week  She will do a follow-up with Dr. Richardson Landry in a few weeks time she was told if she has ongoing troubles or worsening issues to notify us

## 2018-08-01 ENCOUNTER — Other Ambulatory Visit: Payer: Self-pay | Admitting: Family Medicine

## 2018-08-03 NOTE — Telephone Encounter (Signed)
Last seen 07/10/2018

## 2018-09-07 ENCOUNTER — Other Ambulatory Visit: Payer: Self-pay | Admitting: Family Medicine

## 2019-02-23 ENCOUNTER — Other Ambulatory Visit: Payer: Self-pay | Admitting: Family Medicine

## 2019-04-05 ENCOUNTER — Telehealth: Payer: Self-pay | Admitting: Family Medicine

## 2019-04-06 ENCOUNTER — Encounter: Payer: Self-pay | Admitting: Family Medicine

## 2019-04-06 ENCOUNTER — Ambulatory Visit: Payer: 59 | Admitting: Family Medicine

## 2019-04-06 ENCOUNTER — Other Ambulatory Visit: Payer: Self-pay

## 2019-04-06 VITALS — BP 132/76 | Temp 97.7°F | Wt 157.8 lb

## 2019-04-06 DIAGNOSIS — M545 Low back pain, unspecified: Secondary | ICD-10-CM

## 2019-04-06 LAB — POCT URINALYSIS DIPSTICK
Spec Grav, UA: >=1.03 — AB (ref 1.010–1.025)
pH, UA: 6 (ref 5.0–8.0)

## 2019-04-06 NOTE — Progress Notes (Signed)
u7

## 2019-04-06 NOTE — Progress Notes (Signed)
Subjective:    Patient ID: Brenda King, female    DOB: [birth year], 59 y.o.   MRN: [id number]  Back Pain Episode onset: 4 weeks. The quality of the pain is described as aching. (Right sided back pain, urine is a little darker than normal) Treatments tried: Advil; fluids(water and Body Armour)   Results for orders placed or performed in visit on 12/17/16  CBC with Differential/Platelet  Result Value Ref Range   WBC 5.5 3.4 - 10.8 x10E3/uL   RBC 4.16 3.77 - 5.28 x10E6/uL   Hemoglobin 13.6 11.1 - 15.9 g/dL   Hematocrit 39.9 34.0 - 46.6 %   MCV 96 79 - 97 fL   MCH 32.7 26.6 - 33.0 pg   MCHC 34.1 31.5 - 35.7 g/dL   RDW 12.9 12.3 - 15.4 %   Platelets 301 150 - 379 x10E3/uL   Neutrophils 54 Not Estab. %   Lymphs 35 Not Estab. %   Monocytes 8 Not Estab. %   Eos 3 Not Estab. %   Basos 0 Not Estab. %   Neutrophils Absolute 3.0 1.4 - 7.0 x10E3/uL   Lymphocytes Absolute 1.9 0.7 - 3.1 x10E3/uL   Monocytes Absolute 0.4 0.1 - 0.9 x10E3/uL   EOS (ABSOLUTE) 0.2 0.0 - 0.4 x10E3/uL   Basophils Absolute 0.0 0.0 - 0.2 x10E3/uL   Immature Granulocytes 0 Not Estab. %   Immature Grans (Abs) 0.0 0.0 - 0.1 x10E3/uL  Hepatic function panel  Result Value Ref Range   Total Protein 6.1 6.0 - 8.5 g/dL   Albumin 4.1 3.5 - 5.5 g/dL   Bilirubin Total <0.2 0.0 - 1.2 mg/dL   Bilirubin, Direct 0.08 0.00 - 0.40 mg/dL   Alkaline Phosphatase 53 39 - 117 IU/L   AST 17 0 - 40 IU/L   ALT 16 0 - 32 IU/L  Lipid panel  Result Value Ref Range   Cholesterol, Total 161 100 - 199 mg/dL   Triglycerides 60 0 - 149 mg/dL   HDL 61 >39 mg/dL   VLDL Cholesterol Cal 12 5 - 40 mg/dL   LDL Calculated 88 0 - 99 mg/dL   Chol/HDL Ratio 2.6 0.0 - 4.4 ratio  Basic metabolic panel  Result Value Ref Range   Glucose 93 65 - 99 mg/dL   BUN 17 6 - 24 mg/dL   Creatinine, Ser 0.70 0.57 - 1.00 mg/dL   GFR calc non Af Amer 99 >59 mL/min/1.73   GFR calc Af Amer 114 >59 mL/min/1.73   BUN/Creatinine Ratio 24 (H) 9 - 23   Sodium 142  134 - 144 mmol/L   Potassium 4.8 3.5 - 5.2 mmol/L   Chloride 106 96 - 106 mmol/L   CO2 25 20 - 29 mmol/L   Calcium 9.0 8.7 - 10.2 mg/dL  TSH  Result Value Ref Range   TSH 1.460 0.450 - 4.500 uIU/mL  Magnesium  Result Value Ref Range   Magnesium 2.0 1.6 - 2.3 mg/dL  POCT urinalysis dipstick  Result Value Ref Range   Color, UA     Clarity, UA     Glucose, UA     Bilirubin, UA     Ketones, UA     Spec Grav, UA <=1.005 (A) 1.010 - 1.025   Blood, UA     pH, UA 5.0 5.0 - 8.0   Protein, UA     Urobilinogen, UA  0.2 or 1.0 E.U./dL   Nitrite, UA     Leukocytes, UA  Negative  Patient has longstanding history of musculoskeletal pain.  Sees Dr. Cherlyn Cushing on a regular basis.  Also does her own exercises.  Lately her back pain has been rising up on the right side.  More up along the right flank.  Was a bit worried about potential for urinary tract infection.  Has had kidney infections in the past  No fever no chills  Pain is worse with certain movements   Review of Systems  Musculoskeletal: Positive for back pain.       Objective:   Physical Exam  Alert vitals stable, NAD. Blood pressure good on repeat. HEENT normal. Lungs clear. Heart regular rate and rhythm. No spinal tenderness no true CVA tenderness positive right paraspinal tenderness deep in the ligament and muscle bundle  Urinalysis no white blood cells no red blood cells no bacteria negative leukocyte that    Assessment & Plan:  Impression right.  Lumbar strain/chronic/local measures discussed/ibuprofen as needed continue exercises no evidence of infection discussed

## 2019-04-07 NOTE — Telephone Encounter (Signed)
ERROR

## 2019-04-14 ENCOUNTER — Other Ambulatory Visit (HOSPITAL_COMMUNITY)
Admission: RE | Admit: 2019-04-14 | Discharge: 2019-04-14 | Disposition: A | Discharge status: Home / Self Care | Payer: No Typology Code available for payment source | Source: Ambulatory Visit | Attending: Family Medicine | Admitting: Family Medicine

## 2019-04-14 ENCOUNTER — Ambulatory Visit (HOSPITAL_COMMUNITY)
Admission: RE | Admit: 2019-04-14 | Discharge: 2019-04-14 | Disposition: A | Discharge status: Home / Self Care | Payer: No Typology Code available for payment source | Source: Ambulatory Visit | Attending: Family Medicine | Admitting: Family Medicine

## 2019-04-14 ENCOUNTER — Other Ambulatory Visit: Payer: Self-pay | Admitting: Family Medicine

## 2019-04-14 ENCOUNTER — Ambulatory Visit (INDEPENDENT_AMBULATORY_CARE_PROVIDER_SITE_OTHER): Payer: 59 | Admitting: Family Medicine

## 2019-04-14 ENCOUNTER — Encounter: Payer: Self-pay | Admitting: Family Medicine

## 2019-04-14 ENCOUNTER — Other Ambulatory Visit: Payer: Self-pay

## 2019-04-14 VITALS — BP 108/72 | Temp 97.8°F | Ht 70.0 in | Wt 156.0 lb

## 2019-04-14 DIAGNOSIS — M79604 Pain in right leg: Secondary | ICD-10-CM | POA: Diagnosis not present

## 2019-04-14 DIAGNOSIS — M545 Low back pain, unspecified: Secondary | ICD-10-CM

## 2019-04-14 DIAGNOSIS — M79605 Pain in left leg: Secondary | ICD-10-CM

## 2019-04-14 LAB — D-DIMER, QUANTITATIVE: D-Dimer, Quant: 0.32 ug/mL-FEU (ref 0.00–0.50)

## 2019-04-14 MED ORDER — ETODOLAC 400 MG PO TABS: 400.0000 mg | ORAL_TABLET | Freq: Two times a day (BID) | ORAL | 1 refills | Status: DC

## 2019-04-14 NOTE — Progress Notes (Signed)
   Subjective:    Patient ID: Brenda King, female    DOB: 05/24/1960, 59 y.o.   MRN: [id number]  HPIhit her lower left leg about 3 weeks ago and having some pain and swelling in leg now. Started hurting yesterday when she was walking.   Results for orders placed or performed during the hospital encounter of 04/14/19  D-dimer, quantitative (not at [facility])  Result Value Ref Range   D-Dimer, Quant 0.32 0.00 - 0.50 ug/mL-FEU   Patient struck the anterior portion of her shin.  Caught it on the edge of a step.  Developed subsequent pain.  Also swelling.  Pain is mostly in the medial right calf.  Some discomfort when stretching or moving or walking.  Definitely painful with pressure to this area.  Patient reports ongoing right paralumbar pain.  See prior note.  Minimally responsive to over-the-counter agents  No history of DVT  Review of Systems No headache no chest pain no shortness of breath    Objective:   Physical Exam   Alert vitals stable, NAD. Blood pressure good on repeat. HEENT normal. Lungs clear. Heart regular rate and rhythm. Right paralumbar tenderness to deep palpation right anterior shin hematoma palpable medial calf somewhat tenderness in this region.  Negative Homans' sign.  Good pulses     Assessment & Plan:  Impression right leg injury with subsequent calf pain.  Discussion held.  Concern regarding potential for clot.  DVT performed.  Thankfully this is negative.  D-dimer performed.  This is also negative.  2.  Right paralumbar pain.  Substantial.  Minimal response to current chiropractor care.  Plan trial of prescription strength anti-inflammatory local measures discussed symptom care discussed  Greater than 50% of this 30 minute face to face visit was spent in counseling and discussion and coordination of care regarding the above diagnosis/diagnosies

## 2019-04-21 ENCOUNTER — Encounter: Payer: Self-pay | Admitting: Family Medicine

## 2019-09-15 ENCOUNTER — Other Ambulatory Visit: Payer: Self-pay

## 2019-09-15 ENCOUNTER — Ambulatory Visit: Payer: BC Managed Care – PPO | Admitting: Family Medicine

## 2019-09-15 ENCOUNTER — Encounter: Payer: Self-pay | Admitting: Family Medicine

## 2019-09-15 VITALS — BP 118/72 | HR 73 | Temp 97.6°F | Ht 70.0 in | Wt 146.0 lb

## 2019-09-15 DIAGNOSIS — R197 Diarrhea, unspecified: Secondary | ICD-10-CM | POA: Diagnosis not present

## 2019-09-15 DIAGNOSIS — R634 Abnormal weight loss: Secondary | ICD-10-CM | POA: Diagnosis not present

## 2019-09-15 DIAGNOSIS — F419 Anxiety disorder, unspecified: Secondary | ICD-10-CM

## 2019-09-15 DIAGNOSIS — R110 Nausea: Secondary | ICD-10-CM

## 2019-09-15 DIAGNOSIS — R319 Hematuria, unspecified: Secondary | ICD-10-CM | POA: Insufficient documentation

## 2019-09-15 DIAGNOSIS — L959 Vasculitis limited to the skin, unspecified: Secondary | ICD-10-CM

## 2019-09-15 MED ORDER — ONDANSETRON 4 MG PO TBDP: 4.0000 mg | ORAL_TABLET | Freq: Three times a day (TID) | ORAL | 0 refills | Status: DC | PRN

## 2019-09-15 NOTE — Progress Notes (Signed)
Patient ID: Brenda King, female    DOB: Jul 27, 1960, 59 y.o.   MRN: [id number]   Chief Complaint  Patient presents with  . Rash    rash on both legs and super nauseated with some diarrhea for 2 days   Subjective:    HPI Rash on legs and both legs and had it in April , resolved and now returned. Working at night lots of walking with walking, standing. Same amt of redness on the lower legs.  Also having some nausea and diarrhea. For last 2 days.  Diarrhea- 2x today. Diarrhea 3x.  Brown watery stool. Drinking ginger ale.  And not eating much.  Has been on antidepressants and anxiety worsening during covid. Staying home and work and not out in public again. Didn't get covid vaccine.  Had blood in urine in past, and went to urologist. Was a smoker.  Quit for 2.5 yrs.  Had cystoscope with urologist and was negative but told to f/u but she didn't follow up.  Pt had colonoscopy and has episode of problem with allergy for laxative. In 2020, colonoscopy.  Seeing ob/gyn- Dr. Tressia Danas, managing her anxiety and panic attacks. On xanax, klonapin, and on lexapro. After panic attacks, feels this happened since menopause. Very sensitive to meds, and needing to cut the tab in half due to very over sedating.   Medical History Kayelynn has a past medical history of Anxiety, Atrial fibrillation ([place]), Constipation, Degenerative disc disease, cervical, and Menopause.   Outpatient Encounter Medications as of 09/15/2019  Medication Sig  . albuterol (VENTOLIN HFA) 108 (90 Base) MCG/ACT inhaler TAKE 2 PUFFS BY MOUTH EVERY 6 HOURS AS NEEDED FOR WHEEZE OR SHORTNESS OF BREATH  . ALPRAZolam (XANAX) 0.25 MG tablet Take 0.25 mg by mouth daily as needed for anxiety.   Marland Kitchen aspirin EC 81 MG tablet Take 81 mg by mouth daily.  Marland Kitchen aspirin-acetaminophen-caffeine (EXCEDRIN MIGRAINE) 250-250-65 MG tablet Take 2 tablets by mouth every 6 (six) hours as needed for headache.  . clonazePAM (KLONOPIN) 1 MG tablet  Take 1 mg by mouth 2 (two) times daily.  . diazepam (VALIUM) 5 MG tablet Take 5 mg by mouth at bedtime as needed for sedation.  (Patient not taking: Reported on 09/15/2019)  . escitalopram (LEXAPRO) 10 MG tablet Take 10 mg by mouth daily.  Marland Kitchen estradiol (ESTRACE) 2 MG tablet Take 2 mg by mouth daily.  Marland Kitchen etodolac (LODINE) 400 MG tablet Take 1 tablet (400 mg total) by mouth 2 (two) times daily.  Marland Kitchen EVENING PRIMROSE OIL PO Take 1 capsule by mouth every evening.  . ondansetron (ZOFRAN ODT) 4 MG disintegrating tablet Take 1 tablet (4 mg total) by mouth every 8 (eight) hours as needed for nausea or vomiting.  . progesterone (PROMETRIUM) 100 MG capsule Take 100 mg by mouth every evening.  . rizatriptan (MAXALT-MLT) 10 MG disintegrating tablet rizatriptan 10 mg disintegrating tablet  DISSOLVE 1 TABLET IN MOUTH AS NEEDED   No facility-administered encounter medications on file as of 09/15/2019.     Review of Systems   Vitals BP 118/72   Pulse 73   Temp 97.6 F (36.4 C) (Oral)   Ht '5\' 10"'$  (1.778 m)   Wt 146 lb (66.2 kg)   SpO2 98%   BMI 20.95 kg/m   Objective:   Physical Exam Vitals and nursing note reviewed.  Constitutional:      Appearance: Normal appearance.  HENT:     Head: Normocephalic and atraumatic.     Nose:  Nose normal.     Mouth/Throat:     Mouth: Mucous membranes are moist.     Pharynx: Oropharynx is clear.  Eyes:     Extraocular Movements: Extraocular movements intact.     Conjunctiva/sclera: Conjunctivae normal.     Pupils: Pupils are equal, round, and reactive to light.  Cardiovascular:     Rate and Rhythm: Normal rate and regular rhythm.     Pulses: Normal pulses.     Heart sounds: Normal heart sounds.  Pulmonary:     Effort: Pulmonary effort is normal.     Breath sounds: Normal breath sounds. No wheezing, rhonchi or rales.  Abdominal:     General: Abdomen is flat. Bowel sounds are normal. There is no distension.     Palpations: Abdomen is soft. There is no  mass.     Tenderness: There is no abdominal tenderness. There is no guarding or rebound.     Hernia: No hernia is present.  Musculoskeletal:        General: Normal range of motion.     Right lower leg: No edema.     Left lower leg: No edema.  Skin:    General: Skin is warm and dry.     Findings: Erythema and rash (lower legs with purapura on anterior shins) present. No bruising or lesion.  Neurological:     General: No focal deficit present.     Mental Status: She is alert and oriented to person, place, and time.     Cranial Nerves: No cranial nerve deficit.     Motor: No weakness.     Gait: Gait normal.  Psychiatric:        Behavior: Behavior normal.     Comments: +anxious affect      Assessment and Plan   1. Hematuria, unspecified type - CBC - CMP14+EGFR - Urinalysis, Routine w reflex microscopic  2. Diarrhea, unspecified type - CBC - CMP14+EGFR - TSH  3. Nausea - ondansetron (ZOFRAN ODT) 4 MG disintegrating tablet; Take 1 tablet (4 mg total) by mouth every 8 (eight) hours as needed for nausea or vomiting.  Dispense: 20 tablet; Refill: 0  4. Weight loss - TSH  5. Anxiety - TSH  6. Vasculitis of skin   Diarrhea- intermittent.  Try pepto bismol or immodium prn.  Increase fluids and brat diet.   Nausea- gave zofran.  Anxiety- get tsh, cont meds.   Vasculitis of LE- rest, elevation, compression stockings, and HC cream.  Pt to get labs today to evaluate for thyroid disorder, infection, or abnormal electrolytes or platelets.  Will call pt with results. F/u prn.

## 2019-09-16 ENCOUNTER — Encounter: Payer: Self-pay | Admitting: Family Medicine

## 2019-09-17 ENCOUNTER — Other Ambulatory Visit: Payer: Self-pay | Admitting: Family Medicine

## 2019-09-17 DIAGNOSIS — L959 Vasculitis limited to the skin, unspecified: Secondary | ICD-10-CM

## 2019-09-17 LAB — URINALYSIS, ROUTINE W REFLEX MICROSCOPIC
Bilirubin, UA: NEGATIVE
Glucose, UA: NEGATIVE
Ketones, UA: NEGATIVE
Leukocytes,UA: NEGATIVE
Nitrite, UA: NEGATIVE
Protein,UA: NEGATIVE
Specific Gravity, UA: 1.008 (ref 1.005–1.030)
Urobilinogen, Ur: 0.2 mg/dL (ref 0.2–1.0)
pH, UA: 7 (ref 5.0–7.5)

## 2019-09-17 LAB — CMP14+EGFR
ALT: 16 IU/L (ref 0–32)
AST: 18 IU/L (ref 0–40)
Albumin/Globulin Ratio: 1.7 (ref 1.2–2.2)
Albumin: 3.8 g/dL (ref 3.8–4.9)
Alkaline Phosphatase: 74 IU/L (ref 48–121)
BUN/Creatinine Ratio: 8 — ABNORMAL LOW (ref 9–23)
BUN: 6 mg/dL (ref 6–24)
Bilirubin Total: <0.2 mg/dL (ref 0.0–1.2)
CO2: 30 mmol/L — ABNORMAL HIGH (ref 20–29)
Calcium: 9 mg/dL (ref 8.7–10.2)
Chloride: 106 mmol/L (ref 96–106)
Creatinine, Ser: 0.75 mg/dL (ref 0.57–1.00)
GFR calc Af Amer: 103 mL/min/{1.73_m2} (ref >59)
GFR calc non Af Amer: 89 mL/min/{1.73_m2} (ref >59)
Globulin, Total: 2.2 g/dL (ref 1.5–4.5)
Glucose: 66 mg/dL (ref 65–99)
Potassium: 4.2 mmol/L (ref 3.5–5.2)
Sodium: 145 mmol/L — ABNORMAL HIGH (ref 134–144)
Total Protein: 6 g/dL (ref 6.0–8.5)

## 2019-09-17 LAB — MICROSCOPIC EXAMINATION
Bacteria, UA: NONE SEEN
Casts: NONE SEEN /lpf
RBC, Urine: NONE SEEN /hpf (ref 0–2)
WBC, UA: NONE SEEN /hpf (ref 0–5)

## 2019-09-17 LAB — CBC
Hematocrit: 42.4 % (ref 34.0–46.6)
Hemoglobin: 14.3 g/dL (ref 11.1–15.9)
MCH: 33.1 pg — ABNORMAL HIGH (ref 26.6–33.0)
MCHC: 33.7 g/dL (ref 31.5–35.7)
MCV: 98 fL — ABNORMAL HIGH (ref 79–97)
Platelets: 314 10*3/uL (ref 150–450)
RBC: 4.32 x10E6/uL (ref 3.77–5.28)
RDW: 12.3 % (ref 11.7–15.4)
WBC: 8 10*3/uL (ref 3.4–10.8)

## 2019-09-17 LAB — TSH: TSH: 1.72 u[IU]/mL (ref 0.450–4.500)

## 2019-09-23 ENCOUNTER — Telehealth: Payer: Self-pay | Admitting: Dermatology

## 2019-09-23 NOTE — Telephone Encounter (Signed)
Made appt w/ST for 7/29 @12 :15. EST. [facility], Dr Lovena Le, was supposed to refer her here.

## 2019-10-04 ENCOUNTER — Telehealth: Payer: Self-pay | Admitting: Dermatology

## 2019-10-04 ENCOUNTER — Ambulatory Visit: Payer: 59 | Admitting: Dermatology

## 2019-10-04 NOTE — Telephone Encounter (Signed)
Husband called . She can't make it today. Needs very late appt. Since she works nights. Wants you to call him [phone]

## 2019-10-04 NOTE — Telephone Encounter (Signed)
Patient's husband, Simona Huh, left message on office voice mail saying that he was returning your call about rescheduling Gearline's appointment with Lavonna Monarch, MD on 10/14/2019.

## 2019-10-06 ENCOUNTER — Ambulatory Visit: Payer: Self-pay | Admitting: Dermatology

## 2019-10-14 ENCOUNTER — Ambulatory Visit: Payer: 59 | Admitting: Dermatology

## 2019-11-15 ENCOUNTER — Ambulatory Visit: Payer: BC Managed Care – PPO | Admitting: Family Medicine

## 2019-11-15 ENCOUNTER — Other Ambulatory Visit: Payer: Self-pay

## 2019-11-15 ENCOUNTER — Encounter: Payer: Self-pay | Admitting: Family Medicine

## 2019-11-15 VITALS — BP 110/64 | HR 82 | Temp 97.6°F | Ht 70.0 in | Wt 142.4 lb

## 2019-11-15 DIAGNOSIS — B370 Candidal stomatitis: Secondary | ICD-10-CM | POA: Diagnosis not present

## 2019-11-15 DIAGNOSIS — B3781 Candidal esophagitis: Secondary | ICD-10-CM | POA: Diagnosis not present

## 2019-11-15 LAB — POCT RAPID STREP A (OFFICE): Rapid Strep A Screen: NEGATIVE

## 2019-11-15 MED ORDER — NYSTATIN 100000 UNIT/ML MT SUSP: 5.0000 mL | Freq: Four times a day (QID) | OROMUCOSAL | 0 refills | Status: DC

## 2019-11-15 MED ORDER — NYSTATIN 100000 UNIT/ML MT SUSP: 5.0000 mL | Freq: Four times a day (QID) | OROMUCOSAL | 3 refills | Status: DC

## 2019-11-15 NOTE — Progress Notes (Addendum)
Patient ID: Brenda King, female    DOB: [birth year], 59 y.o.   MRN: [id number]   Chief Complaint  Patient presents with  . Rough Patch in Mouth   Subjective:    HPI Pt having some rough patches inside mouth, on roof of mouth and on uvula. The white patches on uvula come and go.  Gargling with [employer] mouthwash that has peroxide in it.     Medical History Jadda has a past medical history of Anxiety, Atrial fibrillation ([place]), Constipation, Degenerative disc disease, cervical, and Menopause.   Outpatient Encounter Medications as of 11/15/2019  Medication Sig  . albuterol (VENTOLIN HFA) 108 (90 Base) MCG/ACT inhaler TAKE 2 PUFFS BY MOUTH EVERY 6 HOURS AS NEEDED FOR WHEEZE OR SHORTNESS OF BREATH  . ALPRAZolam (XANAX) 0.25 MG tablet Take 0.25 mg by mouth daily as needed for anxiety.   Marland Kitchen aspirin EC 81 MG tablet Take 81 mg by mouth daily.  Marland Kitchen aspirin-acetaminophen-caffeine (EXCEDRIN MIGRAINE) 250-250-65 MG tablet Take 2 tablets by mouth every 6 (six) hours as needed for headache.  . clonazePAM (KLONOPIN) 1 MG tablet Take 1 mg by mouth 2 (two) times daily.  . diazepam (VALIUM) 5 MG tablet Take 5 mg by mouth at bedtime as needed for sedation.   Marland Kitchen escitalopram (LEXAPRO) 10 MG tablet Take 10 mg by mouth daily.  Marland Kitchen estradiol (ESTRACE) 2 MG tablet Take 2 mg by mouth daily.  Marland Kitchen etodolac (LODINE) 400 MG tablet Take 1 tablet (400 mg total) by mouth 2 (two) times daily.  Marland Kitchen EVENING PRIMROSE OIL PO Take 1 capsule by mouth every evening.  . ondansetron (ZOFRAN ODT) 4 MG disintegrating tablet Take 1 tablet (4 mg total) by mouth every 8 (eight) hours as needed for nausea or vomiting.  . progesterone (PROMETRIUM) 100 MG capsule Take 100 mg by mouth every evening.  . rizatriptan (MAXALT-MLT) 10 MG disintegrating tablet rizatriptan 10 mg disintegrating tablet  DISSOLVE 1 TABLET IN MOUTH AS NEEDED  . nystatin (MYCOSTATIN) 100000 UNIT/ML suspension Take 5 mLs (500,000 Units total) by mouth 4 (four) times  daily.  . [DISCONTINUED] nystatin (MYCOSTATIN) 100000 UNIT/ML suspension Take 5 mLs (500,000 Units total) by mouth 4 (four) times daily.   No facility-administered encounter medications on file as of 11/15/2019.     Review of Systems  Constitutional: Negative for chills and fever.  HENT: Positive for sore throat.        Sore hard palate and throat when swallowing.  Eyes: Negative.   Respiratory: Negative.   Cardiovascular: Negative.   Endocrine: Negative.   Genitourinary: Negative.   Musculoskeletal: Negative.   Skin: Negative.   Allergic/Immunologic: Negative.   Neurological: Negative.   Hematological: Negative for adenopathy.  Psychiatric/Behavioral: Negative.      Vitals BP 110/64   Pulse 82   Temp 97.6 F (36.4 C)   Ht 5\' 10"  (1.778 m)   Wt 142 lb 6.4 oz (64.6 kg)   SpO2 100%   BMI 20.43 kg/m   Objective:   Physical Exam Constitutional:      Appearance: Normal appearance.  HENT:     Nose: Nose normal.     Comments: Hard and soft palate red and raw looking. Reports there was white patches that she was able to scrape away. She has been self treating with mouth washes and gargles.    Mouth/Throat:     Pharynx: Posterior oropharyngeal erythema present. No oropharyngeal exudate.  Cardiovascular:     Rate and Rhythm: Regular rhythm.  Heart sounds: Normal heart sounds.  Pulmonary:     Effort: Pulmonary effort is normal.     Breath sounds: Normal breath sounds.  Skin:    General: Skin is warm and dry.  Neurological:     Mental Status: She is alert and oriented to person, place, and time.  Psychiatric:        Mood and Affect: Mood normal.        Behavior: Behavior normal.      Assessment and Plan   1. Thrush of mouth and esophagus (HCC) - nystatin (MYCOSTATIN) 100000 UNIT/ML suspension; Take 5 mLs (500,000 Units total) by mouth 4 (four) times daily.  Dispense: 60 mL; Refill: 3 - POCT rapid strep A - Novel Coronavirus, NAA ([facility]) - Grp A Strep    Trianna presents today with c/o of mouth and throat soreness x 2 weeks. She is a smoker and reports she is quitting by this weekend.  No fever. Due to fact that she had white patches that she scraped away and her throat feels "raw" I will treat for thrush with the understanding that since she is smoker- we will need to see complete resolution of her symptoms or further evaluation will be needed. Also did a rapid strep as she had a co-worker positive for strep 2 weeks ago and her co-worker's daughter was positive for covid last week and she wanted to make sure she wasn't exposed.   Rapid strep negative: sent for culture.   She will be notified of Covid results once available.  She will follow-up in 2 weeks.   Agrees with plan of care discussed today. Understands warning signs to seek further care: changes in the appearance of her throat, fever.  Understands to follow-up in 2 weeks to ensure complete resolution of her symptoms or further follow-up will be needed.    Chalmers Guest, NP 11/15/2019

## 2019-11-15 NOTE — Addendum Note (Signed)
Addended by: Vicente Males on: 11/15/2019 04:17 PM   Modules accepted: Orders

## 2019-11-15 NOTE — Patient Instructions (Signed)
Use Nystain 4 times per day Follow up in 2 weeks for complete resolution        Oral Thrush, Adult  Oral thrush is an infection in your mouth and throat. It causes white patches on your tongue and in your mouth. Follow these instructions at home: Helping with soreness   To lessen your pain: ? Drink cold liquids, like water and iced tea. ? Eat frozen ice pops or frozen juices. ? Eat foods that are easy to swallow, like gelatin and ice cream. ? Drink from a straw if the patches in your mouth are painful. General instructions  Take or use over-the-counter and prescription medicines only as told by your doctor. Medicine for oral thrush may be something to swallow, or it may be something to put on the infected area.  Eat plain yogurt that has live cultures in it. Read the label to make sure.  If you wear dentures: ? Take out your dentures before you go to bed. ? Brush them well. ? Soak them in a denture cleaner.  Rinse your mouth with warm salt-water many times a day. To make the salt-water mixture, completely dissolve 1/2-1 teaspoon of salt in 1 cup of warm water. Contact a doctor if:  Your problems are getting worse.  Your problems do not get better in less than 7 days with treatment.  Your infection is spreading. This may show as white patches on the skin outside of your mouth.  You are nursing your baby and you have redness and pain in the nipples. This information is not intended to replace advice given to you by your health care provider. Make sure you discuss any questions you have with your health care provider. Document Revised: 06/06/2017 Document Reviewed: 11/27/2015 Elsevier Patient Education  [place].

## 2019-11-16 ENCOUNTER — Other Ambulatory Visit: Payer: Self-pay | Admitting: Family Medicine

## 2019-11-16 DIAGNOSIS — B370 Candidal stomatitis: Secondary | ICD-10-CM

## 2019-11-16 DIAGNOSIS — B3781 Candidal esophagitis: Secondary | ICD-10-CM

## 2019-11-16 LAB — STREP A DNA PROBE: Strep Gp A Direct, DNA Probe: NEGATIVE

## 2019-11-17 LAB — NOVEL CORONAVIRUS, NAA: SARS-CoV-2, NAA: NOT DETECTED

## 2019-11-18 ENCOUNTER — Telehealth: Payer: Self-pay | Admitting: Family Medicine

## 2019-11-18 NOTE — Telephone Encounter (Signed)
Spoke with patient and reviewed Covid results. Patient verbalized understanding.

## 2019-11-18 NOTE — Telephone Encounter (Signed)
Patient returning nurses call. Per Autumn, patient's covid test was negative.  Patient understood and has no further questions. No call back needed.

## 2019-11-19 ENCOUNTER — Other Ambulatory Visit: Payer: Self-pay | Admitting: *Deleted

## 2019-11-19 DIAGNOSIS — B3781 Candidal esophagitis: Secondary | ICD-10-CM

## 2019-11-19 DIAGNOSIS — B370 Candidal stomatitis: Secondary | ICD-10-CM

## 2019-11-19 MED ORDER — NYSTATIN 100000 UNIT/ML MT SUSP: 5.0000 mL | Freq: Four times a day (QID) | OROMUCOSAL | 2 refills | Status: DC

## 2019-11-26 ENCOUNTER — Encounter: Payer: Self-pay | Admitting: Family Medicine

## 2019-11-26 ENCOUNTER — Telehealth: Payer: Self-pay | Admitting: Nurse Practitioner

## 2019-11-26 ENCOUNTER — Ambulatory Visit (INDEPENDENT_AMBULATORY_CARE_PROVIDER_SITE_OTHER): Payer: BC Managed Care – PPO | Admitting: Family Medicine

## 2019-11-26 VITALS — BP 120/74 | HR 98 | Temp 97.8°F | Wt 143.6 lb

## 2019-11-26 DIAGNOSIS — B3781 Candidal esophagitis: Secondary | ICD-10-CM | POA: Diagnosis not present

## 2019-11-26 DIAGNOSIS — B370 Candidal stomatitis: Secondary | ICD-10-CM | POA: Diagnosis not present

## 2019-11-26 DIAGNOSIS — Z13220 Encounter for screening for lipoid disorders: Secondary | ICD-10-CM

## 2019-11-26 DIAGNOSIS — R5383 Other fatigue: Secondary | ICD-10-CM

## 2019-11-26 DIAGNOSIS — Z800 Family history of malignant neoplasm of digestive organs: Secondary | ICD-10-CM

## 2019-11-26 DIAGNOSIS — R070 Pain in throat: Secondary | ICD-10-CM

## 2019-11-26 NOTE — Progress Notes (Signed)
Patient ID: Brenda King, female    DOB: March 24, 1960, 59 y.o.   MRN: [id number]   Chief Complaint  Patient presents with  . Thrush    Patient comes in today to follow up and reports no improvment in symptoms.    Subjective:    HPI Throat continues to be itchy, and feels like "something is in there". Has been using Nystatin swish and swallow without improvement. Here for follow-up and next steps.   Medical History Brenda King has a past medical history of Anxiety, Atrial fibrillation ([place]), Constipation, Degenerative disc disease, cervical, and Menopause.   Outpatient Encounter Medications as of 11/26/2019  Medication Sig  . albuterol (VENTOLIN HFA) 108 (90 Base) MCG/ACT inhaler TAKE 2 PUFFS BY MOUTH EVERY 6 HOURS AS NEEDED FOR WHEEZE OR SHORTNESS OF BREATH  . ALPRAZolam (XANAX) 0.25 MG tablet Take 0.25 mg by mouth daily as needed for anxiety.   Marland Kitchen aspirin EC 81 MG tablet Take 81 mg by mouth daily.  Marland Kitchen aspirin-acetaminophen-caffeine (EXCEDRIN MIGRAINE) 250-250-65 MG tablet Take 2 tablets by mouth every 6 (six) hours as needed for headache.  . clonazePAM (KLONOPIN) 1 MG tablet Take 1 mg by mouth 2 (two) times daily.  . diazepam (VALIUM) 5 MG tablet Take 5 mg by mouth at bedtime as needed for sedation.   Marland Kitchen escitalopram (LEXAPRO) 10 MG tablet Take 10 mg by mouth daily.  Marland Kitchen estradiol (ESTRACE) 2 MG tablet Take 2 mg by mouth daily.  Marland Kitchen etodolac (LODINE) 400 MG tablet Take 1 tablet (400 mg total) by mouth 2 (two) times daily.  Marland Kitchen EVENING PRIMROSE OIL PO Take 1 capsule by mouth every evening.  . nystatin (MYCOSTATIN) 100000 UNIT/ML suspension Take 5 mLs (500,000 Units total) by mouth 4 (four) times daily.  . ondansetron (ZOFRAN ODT) 4 MG disintegrating tablet Take 1 tablet (4 mg total) by mouth every 8 (eight) hours as needed for nausea or vomiting.  . progesterone (PROMETRIUM) 100 MG capsule Take 100 mg by mouth every evening.  . rizatriptan (MAXALT-MLT) 10 MG disintegrating tablet rizatriptan 10 mg  disintegrating tablet  DISSOLVE 1 TABLET IN MOUTH AS NEEDED   No facility-administered encounter medications on file as of 11/26/2019.     Review of Systems  Constitutional: Negative.   HENT: Positive for sore throat.        Feels like something in throat, occasional. Difficulty swallowing. Will refer to ENT in [facility] per patient request.   Eyes: Negative.   Respiratory: Negative.   Cardiovascular: Negative.   Gastrointestinal: Negative.   Endocrine: Negative.   Genitourinary: Negative.   Musculoskeletal: Negative.   Skin: Negative.   Allergic/Immunologic: Negative.   Neurological: Negative.   Hematological: Negative.   Psychiatric/Behavioral: Negative.      Vitals BP 120/74   Pulse 98   Temp 97.8 F (36.6 C)   Wt 143 lb 9.6 oz (65.1 kg)   SpO2 99%   BMI 20.60 kg/m   Objective:   Physical Exam Constitutional:      Appearance: Normal appearance.  HENT:     Mouth/Throat:     Pharynx: Posterior oropharyngeal erythema present. No pharyngeal swelling or oropharyngeal exudate.     Tonsils: No tonsillar exudate or tonsillar abscesses.     Comments: Treated as yeast infection with Nystatin swish and swallow. Did not help. Still feels itchy and like something is in throat. Occasional difficulty swallowing ([place]).  Neck:     Thyroid: No thyroid mass, thyromegaly or thyroid tenderness.  Trachea: Trachea normal.     Comments: TSH normal. Lymphadenopathy:     Cervical: No cervical adenopathy.  Neurological:     Mental Status: She is alert.      Assessment and Plan   1. Throat pain in adult - Ambulatory referral to ENT  2. Thrush of mouth and esophagus ([place])  3. Family history of throat cancer - Ambulatory referral to ENT   ENT referral: Dr. Lynford Humphrey in [place], [place] is preference.  Family history of throat cancer (maternal grandfather). Brenda King is a smoker. Cessation discussed.   Agrees with plan of care discussed today. Understands warning  signs to seek further care: increased difficulty swallowing.  Understands to follow-up ENT specialist and will request records be sent to [place].     Chalmers Guest, NP 11/28/2019

## 2019-11-26 NOTE — Telephone Encounter (Signed)
Patient has physical in October and needing labs done

## 2019-11-26 NOTE — Telephone Encounter (Signed)
Most major labs done in July. Would like to get fasting lipid and a vitamin D level. Thanks.

## 2019-11-26 NOTE — Patient Instructions (Signed)
We will make referral to Dr. Lynford Humphrey, ENT in [place].

## 2019-11-29 NOTE — Telephone Encounter (Signed)
Blood work ordered in Epic. Left message to return call 

## 2020-01-07 ENCOUNTER — Encounter: Payer: BC Managed Care – PPO | Admitting: Nurse Practitioner

## 2020-04-06 ENCOUNTER — Encounter: Payer: Self-pay | Admitting: Family Medicine

## 2020-04-06 ENCOUNTER — Ambulatory Visit (INDEPENDENT_AMBULATORY_CARE_PROVIDER_SITE_OTHER): Payer: BC Managed Care – PPO | Admitting: Family Medicine

## 2020-04-06 ENCOUNTER — Other Ambulatory Visit: Payer: Self-pay

## 2020-04-06 VITALS — HR 71 | Temp 97.3°F | Resp 16

## 2020-04-06 DIAGNOSIS — R110 Nausea: Secondary | ICD-10-CM | POA: Diagnosis not present

## 2020-04-06 DIAGNOSIS — U071 COVID-19: Secondary | ICD-10-CM

## 2020-04-06 DIAGNOSIS — R059 Cough, unspecified: Secondary | ICD-10-CM

## 2020-04-06 MED ORDER — FLUTICASONE PROPIONATE 50 MCG/ACT NA SUSP: 2.0000 | Freq: Every day | NASAL | 6 refills | Status: DC

## 2020-04-06 MED ORDER — ALBUTEROL SULFATE HFA 108 (90 BASE) MCG/ACT IN AERS: 2.0000 | INHALATION_SPRAY | Freq: Four times a day (QID) | RESPIRATORY_TRACT | 0 refills | Status: DC | PRN

## 2020-04-06 MED ORDER — ONDANSETRON 8 MG PO TBDP: 8.0000 mg | ORAL_TABLET | Freq: Three times a day (TID) | ORAL | 0 refills | Status: DC | PRN

## 2020-04-06 MED ORDER — BENZONATATE 100 MG PO CAPS: 100.0000 mg | ORAL_CAPSULE | Freq: Two times a day (BID) | ORAL | 0 refills | Status: DC | PRN

## 2020-04-06 NOTE — Patient Instructions (Signed)
Covid-19 warning:  Covid-19 is a virus that causes hypoxia (low oxygen level in blood) in some people. If you develop any changes in your usual breathing pattern: difficulty catching your breath, more short winded with activity or with resting, or anything that concerns you about your breathing, do not hesitate to go to the emergency department immediately for evaluation. Covid infection can also affect the way the brain functions if it lacks oxygen, such as, feeling dizzy, passing out, or feeling confused, if you experience any of these symptoms, please do not delay to seek treatment.  Some people experience gastrointestinal problems with Covid, such as vomiting and diarrhea, dehydration is a serious risk and should be avoided. If you are unable to keep liquids down you may need to go to the emergency department for intravenous fluids to avoid dehydration.   Please alert and involve your family and/or friends to help keep an eye on you while you recover from Covid-19. If you have any questions or concerns about your recovery, please do not hesitate to call the office for guidance.     Recommend supportive therapy while you are recovering:   1) Get lots of rest.  2) Take over the counter pain medication if needed, such as acetaminophen or ibuprofen. Read and follow instructions on the label and make sure not to combine other medications that may have same ingredients in it. It is important to not take too much of these ingredients.  3) Drink plenty of caffeine-free fluids. (If you have heart or kidney problems, follow the instructions of your specialist regarding amounts).  4) If you are hungry, eat a bland diet, such as the BRAT diet (bananas, rice, applesauce, toast).  5) Let us know if you are not feeling better in a week.    Covid-19 Quarantine Instructions:   You have tested positive for Covid-19 infection. The current CDC guidelines for quarantine regardless of vaccination status are:    Please quarantine and isolate at home for a minimum of  5 days.   - If you have no symptoms or your symptoms are resolving after 5 days you   can leave the home (resolving means no shortness of breath, no fever, without taking fever reducing medication, no headache, etc). -Continue to wear a mask around others for an additional 5 days.  -If you were severely ill with Covid-19 you should isolate for at least 10 days.    Use over-the-counter medications for symptoms.If you develop respiratory issues/distress (see Covid warning), seek medical care in the Emergency Department.  If you must leave home or if you have to be around others please wear a mask. Please limit contact with immediate family members in the home, practice social distancing, frequent handwashing and clean hard surfaces touched frequently with household cleaning products. Members of your household will also need to quarantine for 5 days and test on day five if possible.  Covid-19 warning:  Covid-19 is a virus that causes hypoxia (low oxygen level in blood) in some people. If you develop any changes in your usual breathing pattern: difficulty catching your breath, more short winded with activity or with resting, or anything that concerns you about your breathing, do not hesitate to go to the emergency department immediately for evaluation. Covid infection can also affect the way the brain functions if it lacks oxygen, such as, feeling dizzy, passing out, or feeling confused, if you experience any of these symptoms, please do not delay to seek treatment.  Some people experience   gastrointestinal problems with Covid, such as vomiting and diarrhea, dehydration is a serious risk and should be avoided. If you are unable to keep liquids down you may need to go to the emergency department for intravenous fluids to avoid dehydration.   Please alert and involve your family and/or friends to help keep an eye on you while you recover from  Covid-19. If you have any questions or concerns about your recovery, please do not hesitate to call the office for guidance.    

## 2020-04-06 NOTE — Progress Notes (Signed)
Patient presents today with respiratory illness Number of days present- Sunday  Symptoms include- fatigue, sleeping 16 hrs day, diarrhea that turned to dark green in color on Monday, muscle aches, congestion, headache and dry cough  Presence of worrisome signs (severe shortness of breath, lethargy, etc.) - no  Recent/current visit to urgent care or ER-   Recent direct exposure to Covid- no  Any current Covid testing- yes (home test)  Pt work is requesting a PCR test and pt would like note for work starting on Sunday 04/02/20    Patient ID: Brenda King, female    DOB: February 25, 1961, 60 y.o.   MRN: [id number]   Chief Complaint  Patient presents with  . Covid Positive   Subjective:  CC: positive covid test   This is a new problem.  Presents today for an acute visit with a complaint of fatigue and muscle aches.  Tested positive via home test for COVID.  Symptoms started on Sunday.  Has taken Tylenol and Pepto-Bismol.  Has a oxygen saturation meter, her pulse ox has been between 95 and 99%.  Associated symptoms include headache, congestion, cough, abdominal pain, nausea, and diarrhea which has resolved.  Her employer is requiring a PCR COVID test.  Denies fever, chills, chest pain, shortness of breath.    Medical History Brenda King has a past medical history of Anxiety, Atrial fibrillation ([place]), Constipation, Degenerative disc disease, cervical, and Menopause.   Outpatient Encounter Medications as of 04/06/2020  Medication Sig  . albuterol (VENTOLIN HFA) 108 (90 Base) MCG/ACT inhaler Inhale 2 puffs into the lungs every 6 (six) hours as needed for wheezing or shortness of breath.  . ALPRAZolam (XANAX) 0.25 MG tablet Take 0.25 mg by mouth daily as needed for anxiety.   Marland Kitchen aspirin EC 81 MG tablet Take 81 mg by mouth daily.  Marland Kitchen aspirin-acetaminophen-caffeine (EXCEDRIN MIGRAINE) 250-250-65 MG tablet Take 2 tablets by mouth every 6 (six) hours as needed for headache.  . benzonatate (TESSALON)  100 MG capsule Take 1 capsule (100 mg total) by mouth 2 (two) times daily as needed for cough.  . clonazePAM (KLONOPIN) 1 MG tablet Take 1 mg by mouth 2 (two) times daily.  . diazepam (VALIUM) 5 MG tablet Take 5 mg by mouth at bedtime as needed for sedation.   Marland Kitchen escitalopram (LEXAPRO) 10 MG tablet Take 10 mg by mouth daily.  Marland Kitchen estradiol (ESTRACE) 2 MG tablet Take 2 mg by mouth daily.  Marland Kitchen etodolac (LODINE) 400 MG tablet Take 1 tablet (400 mg total) by mouth 2 (two) times daily.  Marland Kitchen EVENING PRIMROSE OIL PO Take 1 capsule by mouth every evening.  . fluticasone (FLONASE) 50 MCG/ACT nasal spray Place 2 sprays into both nostrils daily.  Marland Kitchen nystatin (MYCOSTATIN) 100000 UNIT/ML suspension Take 5 mLs (500,000 Units total) by mouth 4 (four) times daily.  . ondansetron (ZOFRAN ODT) 8 MG disintegrating tablet Take 1 tablet (8 mg total) by mouth every 8 (eight) hours as needed for nausea or vomiting.  . progesterone (PROMETRIUM) 100 MG capsule Take 100 mg by mouth every evening.  . rizatriptan (MAXALT-MLT) 10 MG disintegrating tablet rizatriptan 10 mg disintegrating tablet  DISSOLVE 1 TABLET IN MOUTH AS NEEDED  . [DISCONTINUED] albuterol (VENTOLIN HFA) 108 (90 Base) MCG/ACT inhaler TAKE 2 PUFFS BY MOUTH EVERY 6 HOURS AS NEEDED FOR WHEEZE OR SHORTNESS OF BREATH  . [DISCONTINUED] ondansetron (ZOFRAN ODT) 4 MG disintegrating tablet Take 1 tablet (4 mg total) by mouth every 8 (eight) hours as needed for  nausea or vomiting.   No facility-administered encounter medications on file as of 04/06/2020.     Review of Systems  Constitutional: Positive for fatigue. Negative for chills and fever.  HENT: Positive for congestion, sinus pressure and sinus pain. Negative for sore throat.   Respiratory: Positive for cough. Negative for shortness of breath.   Gastrointestinal: Positive for abdominal pain, diarrhea and nausea. Negative for vomiting.  Musculoskeletal: Positive for myalgias.  Neurological: Positive for headaches.      Vitals Pulse 71   Temp (!) 97.3 F (36.3 C)   Resp 16   SpO2 97%   Objective:   Physical Exam Vitals reviewed.  Constitutional:      General: She is not in acute distress.    Appearance: Normal appearance.  HENT:     Right Ear: Tympanic membrane normal.     Left Ear: Tympanic membrane normal.     Nose: Congestion present.     Mouth/Throat:     Pharynx: Posterior oropharyngeal erythema present. No oropharyngeal exudate.  Cardiovascular:     Rate and Rhythm: Normal rate and regular rhythm.     Heart sounds: Normal heart sounds.  Pulmonary:     Effort: Pulmonary effort is normal.     Breath sounds: Normal breath sounds.  Skin:    General: Skin is warm and dry.  Neurological:     General: No focal deficit present.     Mental Status: She is alert.  Psychiatric:        Behavior: Behavior normal.      Assessment and Plan   1. Cough - benzonatate (TESSALON) 100 MG capsule; Take 1 capsule (100 mg total) by mouth 2 (two) times daily as needed for cough.  Dispense: 20 capsule; Refill: 0 - fluticasone (FLONASE) 50 MCG/ACT nasal spray; Place 2 sprays into both nostrils daily.  Dispense: 16 g; Refill: 6 - albuterol (VENTOLIN HFA) 108 (90 Base) MCG/ACT inhaler; Inhale 2 puffs into the lungs every 6 (six) hours as needed for wheezing or shortness of breath.  Dispense: 8 g; Refill: 0 - Novel Coronavirus, NAA ([facility])  2. Nausea - ondansetron (ZOFRAN ODT) 8 MG disintegrating tablet; Take 1 tablet (8 mg total) by mouth every 8 (eight) hours as needed for nausea or vomiting.  Dispense: 20 tablet; Refill: 0 - Novel Coronavirus, NAA ([facility])  3. COVID-19 virus infection - benzonatate (TESSALON) 100 MG capsule; Take 1 capsule (100 mg total) by mouth 2 (two) times daily as needed for cough.  Dispense: 20 capsule; Refill: 0 - ondansetron (ZOFRAN ODT) 8 MG disintegrating tablet; Take 1 tablet (8 mg total) by mouth every 8 (eight) hours as needed for nausea or vomiting.  Dispense:  20 tablet; Refill: 0 - fluticasone (FLONASE) 50 MCG/ACT nasal spray; Place 2 sprays into both nostrils daily.  Dispense: 16 g; Refill: 6 - albuterol (VENTOLIN HFA) 108 (90 Base) MCG/ACT inhaler; Inhale 2 puffs into the lungs every 6 (six) hours as needed for wheezing or shortness of breath.  Dispense: 8 g; Refill: 0   Will perform PCR COVID test today.  Reports that she is taking toast and ginger ale-- has had some nausea, will treat .  Recommend supportive therapy, adequate hydration, saline flushes, Flonase, and cough medication for symptoms.  She will continue to monitor her oxygen saturation.  COVID warnings as stated below given.  Work note provided.   Agrees with plan of care discussed today. Understands warning signs to seek further care: chest pain, shortness of breath, any  significant change in health.  Understands to follow-up if symptoms do not improve, or worsen.  Will notify once COVID results are available.  Covid-19 warning:  Covid-19 is a virus that causes hypoxia (low oxygen level in blood) in some people. If you develop any changes in your usual breathing pattern: difficulty catching your breath, more short winded with activity or with resting, or anything that concerns you about your breathing, do not hesitate to go to the emergency department immediately for evaluation. Covid infection can also affect the way the brain functions if it lacks oxygen, such as, feeling dizzy, passing out, or feeling confused, if you experience any of these symptoms, please do not delay to seek treatment.  Some people experience gastrointestinal problems with Covid, such as vomiting and diarrhea, dehydration is a serious risk and should be avoided. If you are unable to keep liquids down you may need to go to the emergency department for intravenous fluids to avoid dehydration.   Please alert and involve your family and/or friends to help keep an eye on you while you recover from Covid-19. If you have  any questions or concerns about your recovery, please do not hesitate to call the office for guidance.    Recommend supportive therapy while you are recovering:   1) Get lots of rest.  2) Take over the counter pain medication if needed, such as acetaminophen or ibuprofen. Read and follow instructions on the label and make sure not to combine other medications that may have same ingredients in it. It is important to not take too much of these ingredients.  3) Drink plenty of caffeine-free fluids. (If you have heart or kidney problems, follow the instructions of your specialist regarding amounts).  4) If you are hungry, eat a bland diet, such as the BRAT diet (bananas, rice, applesauce, toast).  5) Let us know if you are not feeling better in a week.   Covid-19 Quarantine Instructions:   You have tested positive for Covid-19 infection. The current CDC guidelines for quarantine regardless of vaccination status are:   Please quarantine and isolate at home for a minimum of  5 days.   - If you have no symptoms or your symptoms are resolving after 5 days you   can leave the home (resolving means no shortness of breath, no fever, without taking fever reducing medication, no headache, etc). -Continue to wear a mask around others for an additional 5 days.  -If you were severely ill with Covid-19 you should isolate for at least 10 days.    Use over-the-counter medications for symptoms.If you develop respiratory issues/distress (see Covid warning), seek medical care in the Emergency Department.  If you must leave home or if you have to be around others please wear a mask. Please limit contact with immediate family members in the home, practice social distancing, frequent handwashing and clean hard surfaces touched frequently with household cleaning products. Members of your household will also need to quarantine for 5 days and test on day five if possible.  Covid-19 warning:  Covid-19 is a virus  that causes hypoxia (low oxygen level in blood) in some people. If you develop any changes in your usual breathing pattern: difficulty catching your breath, more short winded with activity or with resting, or anything that concerns you about your breathing, do not hesitate to go to the emergency department immediately for evaluation. Covid infection can also affect the way the brain functions if it lacks oxygen, such as, feeling dizzy,  passing out, or feeling confused, if you experience any of these symptoms, please do not delay to seek treatment.  Some people experience gastrointestinal problems with Covid, such as vomiting and diarrhea, dehydration is a serious risk and should be avoided. If you are unable to keep liquids down you may need to go to the emergency department for intravenous fluids to avoid dehydration.   Please alert and involve your family and/or friends to help keep an eye on you while you recover from Covid-19. If you have any questions or concerns about your recovery, please do not hesitate to call the office for guidance.     Chalmers Guest, NP 04/06/2020

## 2020-04-08 LAB — SARS-COV-2, NAA 2 DAY TAT

## 2020-04-08 LAB — NOVEL CORONAVIRUS, NAA: SARS-CoV-2, NAA: DETECTED — AB

## 2020-04-24 ENCOUNTER — Other Ambulatory Visit: Payer: Self-pay | Admitting: Family Medicine

## 2020-04-24 DIAGNOSIS — U071 COVID-19: Secondary | ICD-10-CM

## 2020-04-24 DIAGNOSIS — R059 Cough, unspecified: Secondary | ICD-10-CM

## 2020-05-11 ENCOUNTER — Telehealth: Payer: Self-pay | Admitting: Family Medicine

## 2020-05-11 NOTE — Telephone Encounter (Signed)
Patient 's job faxed over FMLA to be completed in your boxed to be filled out. I fill in what I could please review form and complete. Date and sign

## 2020-05-11 NOTE — Telephone Encounter (Signed)
Patient's FMLA has been finished and faxed in. Mailed copy to patient

## 2020-06-02 ENCOUNTER — Telehealth: Payer: Self-pay | Admitting: Family Medicine

## 2020-06-02 NOTE — Telephone Encounter (Signed)
Pt contacted. Head congestion. Started at work last Wednesday. Pt had cook out at work and brought the grills in the plant; pt has no allergies. 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 Pt let it play out. Pt had old antibiotic and pt states that has helped her. Has had mucus with discoloration. Started getting better with antibiotic. No fever. Pt did COVID test and it was negative. Please advise. Thank you

## 2020-06-02 NOTE — Telephone Encounter (Signed)
Not usually giving abx over phone.  Sorry, needs visit.

## 2020-06-02 NOTE — Telephone Encounter (Signed)
Pt contacted and verbalized understanding. Pt states she will call back Monday if no better

## 2020-06-02 NOTE — Telephone Encounter (Signed)
Patient's spouse called in to antibiotic called in for sinus infection tried to explain would need office visit. He stated she had been taking and old prescription but ran out and needing something for the week end. [facility]

## 2020-09-14 ENCOUNTER — Other Ambulatory Visit: Payer: Self-pay

## 2020-09-14 ENCOUNTER — Ambulatory Visit (INDEPENDENT_AMBULATORY_CARE_PROVIDER_SITE_OTHER): Payer: 59

## 2020-09-14 ENCOUNTER — Ambulatory Visit (INDEPENDENT_AMBULATORY_CARE_PROVIDER_SITE_OTHER): Payer: 59 | Admitting: Podiatry

## 2020-09-14 ENCOUNTER — Encounter: Payer: Self-pay | Admitting: Podiatry

## 2020-09-14 DIAGNOSIS — M722 Plantar fascial fibromatosis: Secondary | ICD-10-CM

## 2020-09-14 NOTE — Progress Notes (Signed)
Subjective:   Patient ID: Brenda King, female   DOB: 60 y.o.   MRN: [id number]   HPI Patient presents stating she has had a several year history of severe pain in her right heel and has had numerous conservative treatments including injection treatment boot therapy physical therapy orthotics anti-inflammatories without relief and is also developed progressive pain in her bunion deformity right with redness and pain and has tried wider shoes and soaks without relief.  She does smoke 1/4 pack/day and works on [employer]   Review of Systems  All other systems reviewed and are negative.      Objective:  Physical Exam Vitals and nursing note reviewed.  Constitutional:      Appearance: She is well-developed.  Pulmonary:     Effort: Pulmonary effort is normal.  Musculoskeletal:        General: Normal range of motion.  Skin:    General: Skin is warm.  Neurological:     Mental Status: She is alert.    Neurovascular status found to be intact muscle strength found to be adequate range of motion within normal limits with exquisite discomfort in the plantar aspect of the right heel at the insertional point tendon calcaneus and also noted to have prominent bunion deformity right over left with redness around the first metatarsal head and pain with pressure.  Patient is noted to have good digital perfusion well oriented x3     Assessment:  Chronic Planter fasciitis very tender at the insertional point tendon calcaneus with compensatory pain in the ankle from walking differently along with structural bunion deformity right over left with pain with failure to respond conservatively     Plan:  H&P reviewed all conditions and x-rays.  Spent a great deal of time educating her on both deformities and treatment options and she states this pain is been present for so long with failure to respond she wants surgical intervention.  I discussed endoscopic heel surgery along with structural distal bunion  surgery and I reviewed this with her at great length and discussed there is no long-term guarantees in her foot has gone through a lot of changes and may take quite a bit of time to regain normalcy.  She understands this completely understanding total recovery can take 6 months to 1 year and after review signed consent form understanding risk.  I did dispense air fracture walker for the postop period that and I wanted to get used to that prior to surgery.  Patient scheduled outpatient surgery and is encouraged to call questions concerns  X-rays indicate elevation of the intermetatarsal angle right of approximate 15 degrees between the first and second metatarsal and small bone spur plantar heel no indication stress fracture arthritis

## 2020-09-19 ENCOUNTER — Encounter: Payer: Self-pay | Admitting: Podiatry

## 2020-09-22 ENCOUNTER — Telehealth: Payer: Self-pay | Admitting: Urology

## 2020-09-22 NOTE — Telephone Encounter (Signed)
DOS - 09/26/20  [place] BUNIONECTOMY RIGHT --- [id number] EPF RIGHT --- [id number]   [facility] EFFECTIVE DATE - 08/09/20   PLAN DEDUCTIBLE - $1,500.00 W/ $774.80 REMAINING  OUT OF POCKET - $4,000.00  W/ $3,274.80 REMAINING COINSURANCE - 10% COPAY - $0.00   PER [facility] SITE CPT CODES 70786 AND 75449 HAVE BEEN APPROVED, AUTH # [id number].

## 2020-09-25 MED ORDER — OXYCODONE-ACETAMINOPHEN 10-325 MG PO TABS: 1.0000 | ORAL_TABLET | ORAL | 0 refills | Status: DC | PRN

## 2020-09-25 MED ORDER — ONDANSETRON HCL 4 MG PO TABS: 4.0000 mg | ORAL_TABLET | Freq: Three times a day (TID) | ORAL | 0 refills | Status: DC | PRN

## 2020-09-25 NOTE — Addendum Note (Signed)
Addended by: Wallene Huh on: 09/25/2020 01:55 PM   Modules accepted: Orders

## 2020-09-26 ENCOUNTER — Encounter: Payer: Self-pay | Admitting: Podiatry

## 2020-09-26 DIAGNOSIS — M2011 Hallux valgus (acquired), right foot: Secondary | ICD-10-CM | POA: Diagnosis not present

## 2020-09-26 DIAGNOSIS — M722 Plantar fascial fibromatosis: Secondary | ICD-10-CM | POA: Diagnosis not present

## 2020-09-27 ENCOUNTER — Other Ambulatory Visit: Payer: Self-pay | Admitting: Podiatry

## 2020-09-27 ENCOUNTER — Telehealth: Payer: Self-pay | Admitting: *Deleted

## 2020-09-27 MED ORDER — HYDROMORPHONE HCL 4 MG PO TABS: 4.0000 mg | ORAL_TABLET | ORAL | 0 refills | Status: DC | PRN

## 2020-09-27 NOTE — Telephone Encounter (Signed)
Patient's husband is calling because patient is experiencing a lot of pain,did not sleep at all during the night, oxycodone is not helping and is causing her to itch(given Benadryl). She is requesting a stronger pain medicine such as  Dilaudid?Please advise.

## 2020-09-27 NOTE — Telephone Encounter (Signed)
I called her in stronger medicine to the pharmacy

## 2020-09-27 NOTE — Telephone Encounter (Signed)
Called patient and informed that doctor has sent a stronger medication to pharmacy on file.

## 2020-09-28 ENCOUNTER — Telehealth: Payer: Self-pay

## 2020-10-02 ENCOUNTER — Ambulatory Visit (INDEPENDENT_AMBULATORY_CARE_PROVIDER_SITE_OTHER): Payer: 59 | Admitting: Podiatry

## 2020-10-02 ENCOUNTER — Ambulatory Visit (INDEPENDENT_AMBULATORY_CARE_PROVIDER_SITE_OTHER): Payer: 59

## 2020-10-02 ENCOUNTER — Encounter: Payer: Self-pay | Admitting: Podiatry

## 2020-10-02 ENCOUNTER — Other Ambulatory Visit: Payer: Self-pay

## 2020-10-02 DIAGNOSIS — M722 Plantar fascial fibromatosis: Secondary | ICD-10-CM

## 2020-10-03 NOTE — Progress Notes (Signed)
Subjective:   Patient ID: Brenda King, female   DOB: 60 y.o.   MRN: [id number]   HPI Patient states that she is having a lot of pain and is not able to bear weight down on her foot after surgery.  States she is wearing her boot at all times and states that she is using a walker for relief neuro   ROS      Objective:  Physical Exam  Vascular status intact negative Bevelyn Buckles' sign was noted wound edges themselves are healing excellent with moderate plantar pain noted and around the first MPJ with no range of motion loss good joint motion first MPJ     Assessment:  Clinically appears to be healing well with patient having pain which I do think will resolve as we have gotten the dressing off and have put on a lighter dressing with no indications of pathology with wound edges well coapted good alignment noted good range of motion     Plan:  H&P x-ray reviewed sterile dressing reapplied continue elevation compression gradual increase in motion and weightbearing.  I will see back in 2 weeks for earlier if any issues were to occur and continue oral anti-inflammatories  X-rays indicate that there is good healing of the osteotomy fixation in place good alignment noted

## 2020-10-06 IMAGING — US US EXTREM LOW VENOUS*L*
1 series · 13 of 24 positions shown · non-contrast
Comparison: 06/09/2014

CLINICAL DATA: Left lower extremity pain. Palpable abnormality in
the left anterior lower leg.



[Series 1: us extrem low venous*left* · 0.07mm/px · 13 of 76 slices shown]
[im 1/76]
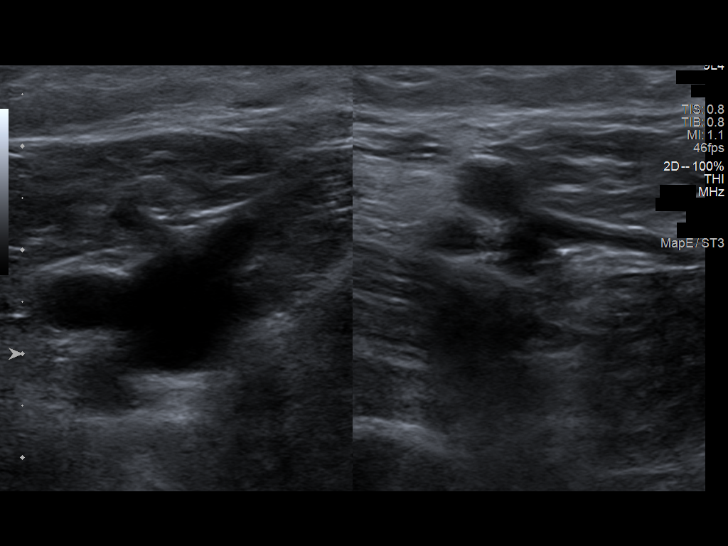
[im 7/76]
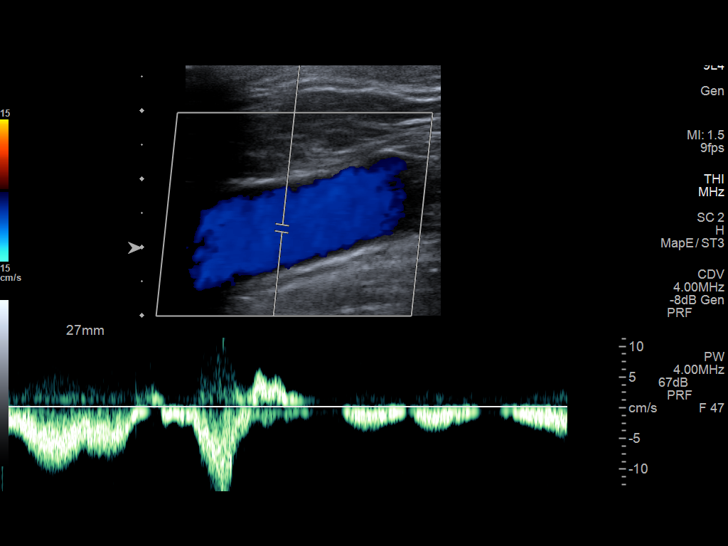
[im 14/76]
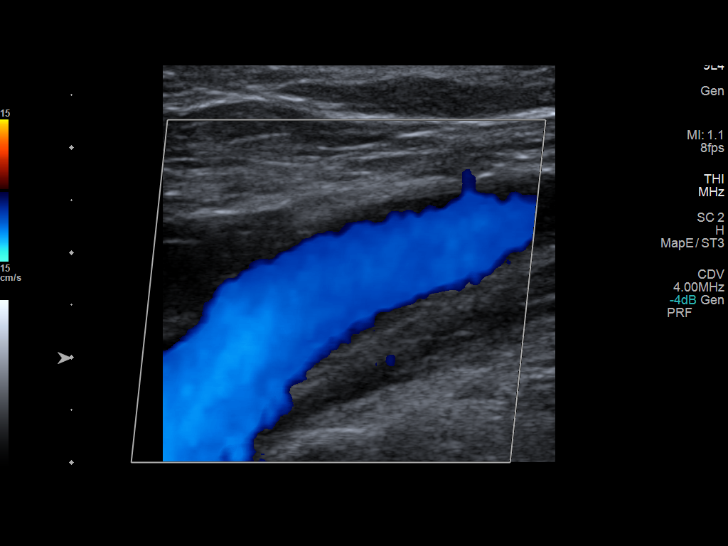
[im 20/76]
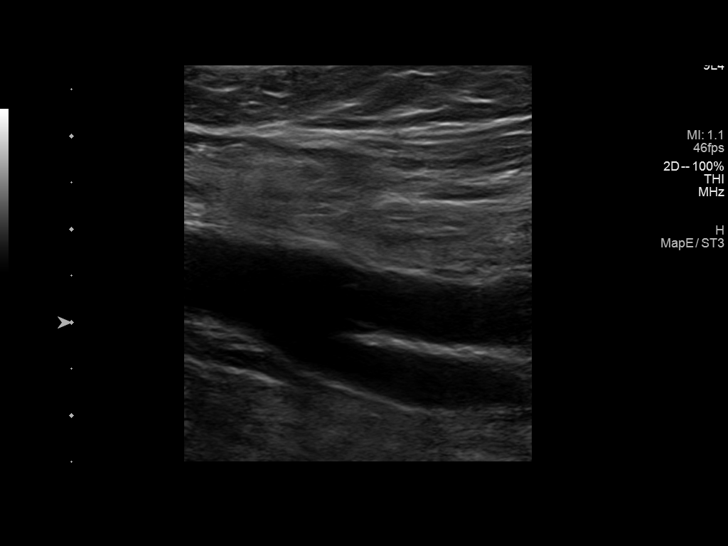
[im 27/76]
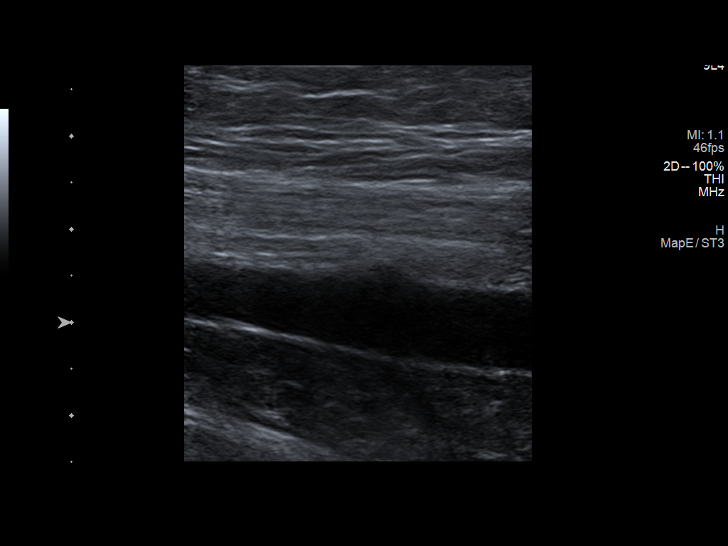
[im 33/76]
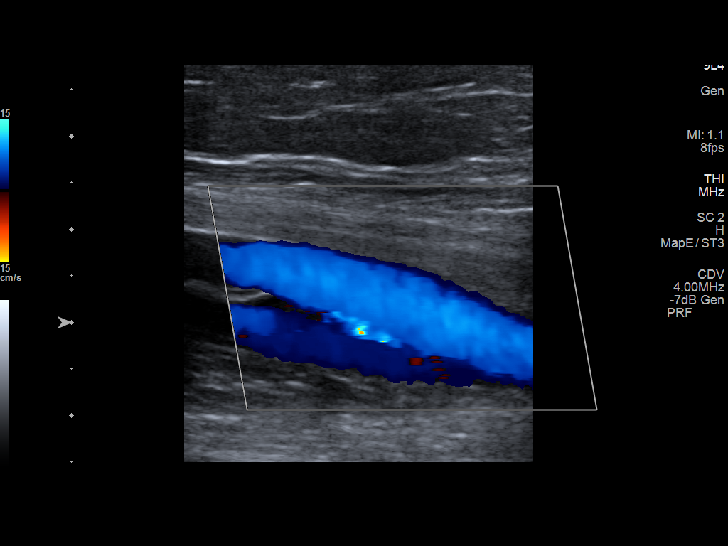
[im 40/76]
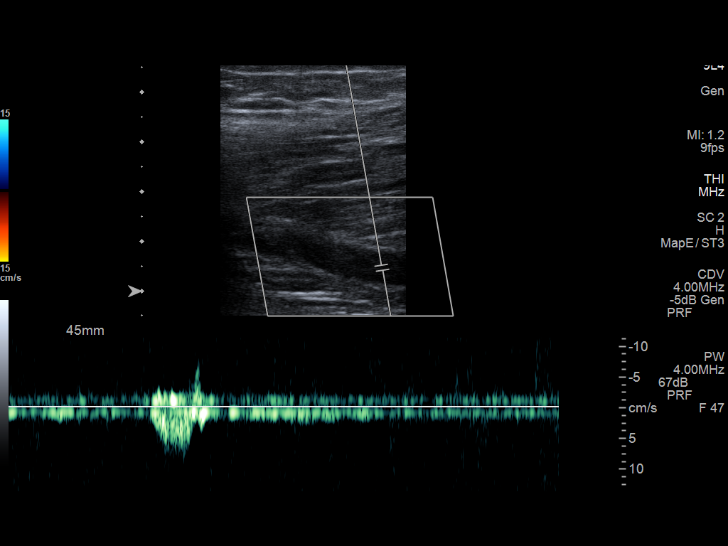
[im 43/76]
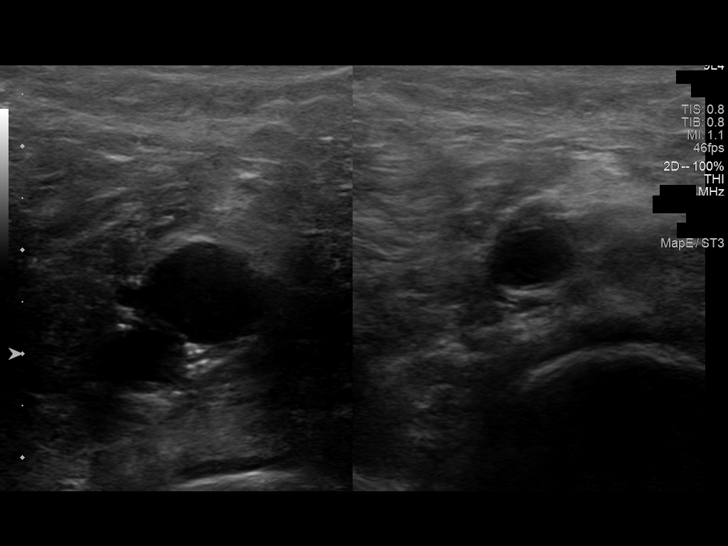
[im 49/76]
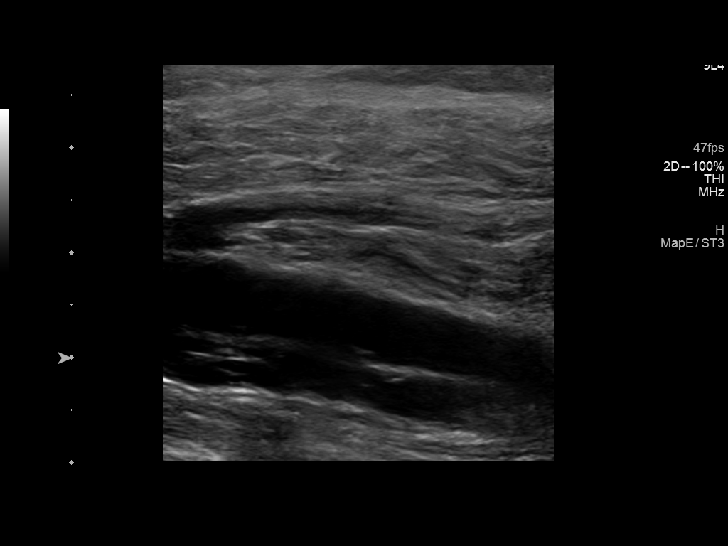
[im 56/76]
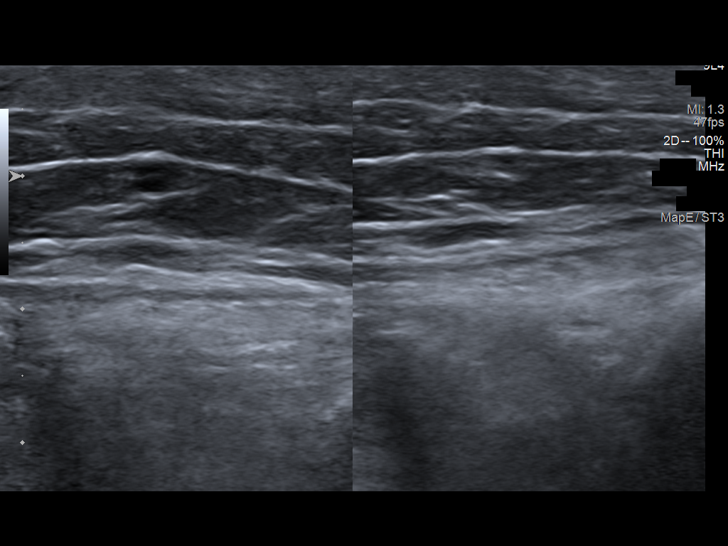
[im 62/76]
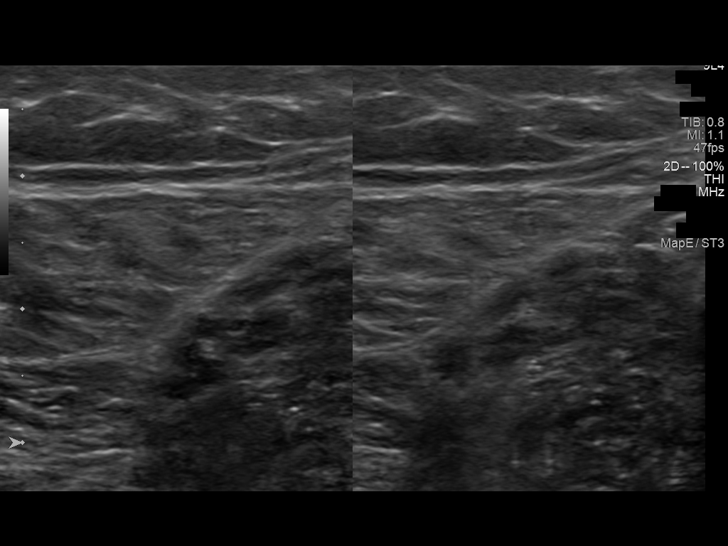
[im 69/76]
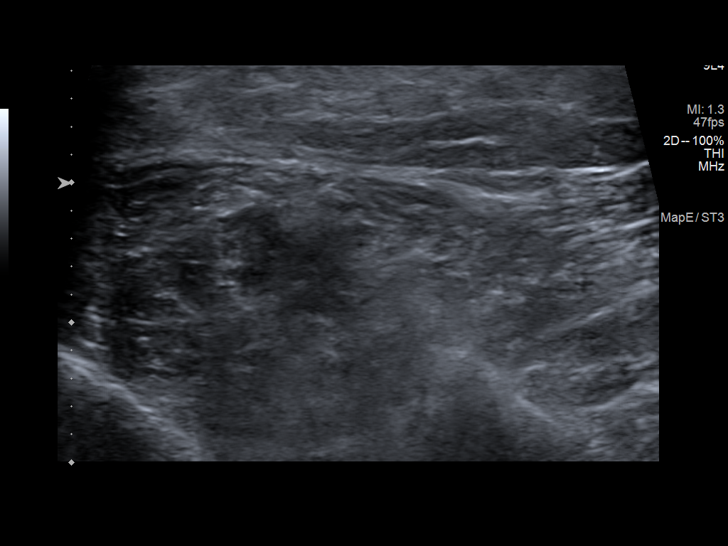
[im 76/76]
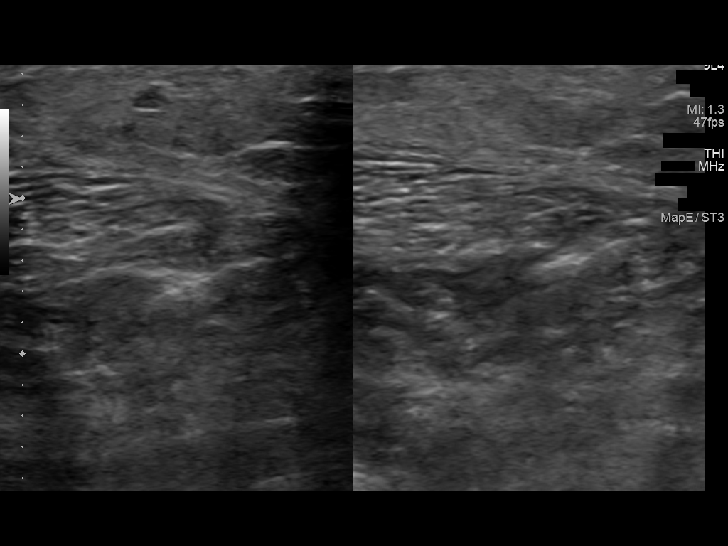

[13 of 24 positions shown; findings below may reference images not displayed]

FINDINGS: Contralateral Common Femoral Vein: Respiratory phasicity is normal
and symmetric with the symptomatic side. No evidence of thrombus.
Normal compressibility.

Common Femoral Vein: No evidence of thrombus. Normal
compressibility, respiratory phasicity and response to augmentation.

Saphenofemoral Junction: No evidence of thrombus. Normal
compressibility and flow on color Doppler imaging.

Profunda Femoral Vein: No evidence of thrombus. Normal
compressibility and flow on color Doppler imaging.

Femoral Vein: No evidence of thrombus. Normal compressibility,
respiratory phasicity and response to augmentation.

Popliteal Vein: No evidence of thrombus. Normal compressibility,
respiratory phasicity and response to augmentation.

Calf Veins: No evidence of thrombus. Normal compressibility and flow
on color Doppler imaging.

Superficial Great Saphenous Vein: No evidence of thrombus. Normal
compressibility.

Venous Reflux:  None

Other Findings: No evidence of superficial thrombophlebitis or
abnormal fluid collection. In the region of palpable abnormality
directed by the patient, no soft tissue abnormality is identified by
ultrasound.
IMPRESSION: No evidence of left lower extremity deep venous thrombosis.

## 2020-10-18 ENCOUNTER — Other Ambulatory Visit: Payer: Self-pay

## 2020-10-18 ENCOUNTER — Ambulatory Visit (INDEPENDENT_AMBULATORY_CARE_PROVIDER_SITE_OTHER): Payer: 59

## 2020-10-18 ENCOUNTER — Ambulatory Visit (INDEPENDENT_AMBULATORY_CARE_PROVIDER_SITE_OTHER): Payer: 59 | Admitting: Podiatry

## 2020-10-18 ENCOUNTER — Encounter: Payer: Self-pay | Admitting: Podiatry

## 2020-10-18 DIAGNOSIS — M722 Plantar fascial fibromatosis: Secondary | ICD-10-CM

## 2020-10-18 NOTE — Progress Notes (Signed)
Subjective:   Patient ID: Brenda King, female   DOB: 60 y.o.   MRN: [id number]   HPI Patient presents stating she has been getting some swelling in her foot she wanted it checked and she is here for suture removal   ROS      Objective:  Physical Exam  Neurovascular status intact negative Bevelyn Buckles' sign was noted with patient's right forefoot showing mild swelling but did have bunion surgery 3 weeks ago with bone cutting and structural correction.  The incision sites are healing well     Assessment:  Probability that she is just been too active on her foot and has developed swelling mechanism     Plan:  Reviewed condition and I do think this will heal uneventfully and reviewed x-ray.  I advised her on elevation and letting her foot get air when she is at home and wear compression stocking when out and will gradually get wearing soft shoe and I do want to check her back again in 3-4  X-rays indicate that there is good healing of the osteotomy fixation in place good alignment noted

## 2020-10-30 NOTE — Telephone Encounter (Signed)
POC

## 2020-11-08 ENCOUNTER — Ambulatory Visit (INDEPENDENT_AMBULATORY_CARE_PROVIDER_SITE_OTHER): Payer: 59

## 2020-11-08 ENCOUNTER — Encounter: Payer: Self-pay | Admitting: Podiatry

## 2020-11-08 ENCOUNTER — Ambulatory Visit (INDEPENDENT_AMBULATORY_CARE_PROVIDER_SITE_OTHER): Payer: 59 | Admitting: Podiatry

## 2020-11-08 ENCOUNTER — Other Ambulatory Visit: Payer: Self-pay

## 2020-11-08 DIAGNOSIS — M722 Plantar fascial fibromatosis: Secondary | ICD-10-CM

## 2020-11-08 DIAGNOSIS — B351 Tinea unguium: Secondary | ICD-10-CM

## 2020-11-08 DIAGNOSIS — M778 Other enthesopathies, not elsewhere classified: Secondary | ICD-10-CM

## 2020-11-08 DIAGNOSIS — Z9889 Other specified postprocedural states: Secondary | ICD-10-CM

## 2020-11-08 NOTE — Progress Notes (Signed)
Subjective:   Patient ID: Brenda King, female   DOB: 60 y.o.   MRN: [id number]   HPI Patient presents stating overall doing well concerned about some redness at times in her foot and she is getting a lot of pain in the outside of her foot.  States that she also has concerns about discoloration of her nailbeds that she has questions about   ROS      Objective:  Physical Exam  Neurovascular status found to be intact mild redness which may be due to her vascular system but it is localized no drainage noted or other pathology with inflammation fluid and swelling around the fifth MPJ right foot which is probably compensation with capsulitis.  The right first MPJ is healing well good range of motion heel doing well with no plantar pain noted currently mild discoloration on the nailbeds localized to the hallux second nail right with history of topical     Assessment:  Acute capsulitis fifth MPJ right along with well-healing structural bunion heel right and moderate localized mycotic nail infection H&P reviewed     Plan:  All conditions reviewed and I went ahead for the outside the right did sterile prep injected the fifth MPJ 3 mg dexamethasone Kenalog 5 mg Xylocaine discussed continue topical usage for the nailbeds and explained color is normal should get improved over time with excellent healing of the osteotomy.  I went ahead today and I did dispense ankle compression stocking to help with any swelling remaining  X-rays indicate excellent healing of osteotomy fixation in place joint congruence

## 2020-11-10 DIAGNOSIS — M79676 Pain in unspecified toe(s): Secondary | ICD-10-CM

## 2020-11-22 ENCOUNTER — Encounter: Payer: Self-pay | Admitting: Podiatry

## 2020-12-01 ENCOUNTER — Encounter: Payer: 59 | Admitting: Podiatry

## 2020-12-08 ENCOUNTER — Ambulatory Visit (INDEPENDENT_AMBULATORY_CARE_PROVIDER_SITE_OTHER): Payer: 59

## 2020-12-08 ENCOUNTER — Encounter: Payer: Self-pay | Admitting: Podiatry

## 2020-12-08 ENCOUNTER — Other Ambulatory Visit: Payer: Self-pay

## 2020-12-08 ENCOUNTER — Ambulatory Visit (INDEPENDENT_AMBULATORY_CARE_PROVIDER_SITE_OTHER): Payer: 59 | Admitting: Podiatry

## 2020-12-08 DIAGNOSIS — Z9889 Other specified postprocedural states: Secondary | ICD-10-CM

## 2020-12-08 DIAGNOSIS — M722 Plantar fascial fibromatosis: Secondary | ICD-10-CM

## 2020-12-08 NOTE — Progress Notes (Signed)
Subjective:   Patient ID: Brenda King, female   DOB: 60 y.o.   MRN: [id number]   HPI Patient states doing better with still mild swelling but continuing to improve   ROS      Objective:  Physical Exam  Neurovascular status intact with patient found to have good healing of the right foot wound edges well coapted good range of motion     Assessment:  Doing well post foot surgery right     Plan:  Advised on gradual increase in activities return to normal activity reappoint as needed  X-rays indicate the osteotomy is healed well fixation in place joint congruence

## 2021-05-13 LAB — COLOGUARD: COLOGUARD: NEGATIVE

## 2021-07-04 ENCOUNTER — Ambulatory Visit (INDEPENDENT_AMBULATORY_CARE_PROVIDER_SITE_OTHER): Payer: BLUE CROSS/BLUE SHIELD | Admitting: Physician Assistant

## 2021-07-04 DIAGNOSIS — C4491 Basal cell carcinoma of skin, unspecified: Secondary | ICD-10-CM

## 2021-07-04 DIAGNOSIS — C44619 Basal cell carcinoma of skin of left upper limb, including shoulder: Secondary | ICD-10-CM | POA: Diagnosis not present

## 2021-07-04 DIAGNOSIS — D485 Neoplasm of uncertain behavior of skin: Secondary | ICD-10-CM

## 2021-07-04 HISTORY — DX: Basal cell carcinoma of skin, unspecified: C44.91

## 2021-07-04 NOTE — Patient Instructions (Signed)

## 2021-07-05 ENCOUNTER — Encounter: Payer: Self-pay | Admitting: Physician Assistant

## 2021-07-05 NOTE — Progress Notes (Signed)
? ?  New Patient ?  ?Subjective  ?Brenda King is a 61 y.o. female who presents for the following: Skin Problem (Here for lesion on left shoulder x 6 months. Is very painful per patient. No history of skin cancers. ). ? ? ?The following portions of the chart were reviewed this encounter and updated as appropriate:  Tobacco  Allergies  Meds  Problems  Med Hx  Surg Hx  Fam Hx   ?  ? ?Objective  ?Well appearing patient in no apparent distress; mood and affect are within normal limits. ? ?A full examination was performed including scalp, head, eyes, ears, nose, lips, neck, chest, axillae, abdomen, back, buttocks, bilateral upper extremities, bilateral lower extremities, hands, feet, fingers, toes, fingernails, and toenails. All findings within normal limits unless otherwise noted below. ? ?Left Shoulder ?Hyperkeratotic scale with pink base  ? ? ? ? ? ? ? ?Assessment & Plan  ?Neoplasm of uncertain behavior of skin ?Left Shoulder ? ?Skin / nail biopsy ?Type of biopsy: tangential   ?Informed consent: discussed and consent obtained   ?Timeout: patient name, date of birth, surgical site, and procedure verified   ?Procedure prep:  Patient was prepped and draped in usual sterile fashion (Non sterile) ?Prep type:  Chlorhexidine ?Anesthesia: the lesion was anesthetized in a standard fashion   ?Anesthetic:  1% lidocaine w/ epinephrine 1-100,000 local infiltration ?Instrument used: flexible razor blade   ?Hemostasis achieved with: aluminum chloride and electrodesiccation   ?Outcome: patient tolerated procedure well   ?Post-procedure details: sterile dressing applied and wound care instructions given   ?Dressing type: petrolatum and bandage   ? ?Specimen 1 - Surgical pathology ?Differential Diagnosis: R/O BCC vs SCC - cautery only ? ?Check Margins: Yes ? ? ? ? ?I, Lenda Baratta, PA-C, have reviewed all documentation's for this visit.  The documentation on 07/05/21 for the exam, diagnosis, procedures and orders are all  accurate and complete. ?

## 2021-07-09 ENCOUNTER — Telehealth: Payer: Self-pay | Admitting: *Deleted

## 2021-07-09 NOTE — Telephone Encounter (Signed)
-----   Message from Warren Danes, [place] sent at 07/09/2021 12:46 PM EDT ----- ?Infiltrative.  Mohs. ?

## 2021-07-09 NOTE — Telephone Encounter (Signed)
Left patient a message to call back for results,  ?

## 2021-07-10 ENCOUNTER — Encounter: Payer: Self-pay | Admitting: *Deleted

## 2021-07-10 NOTE — Telephone Encounter (Signed)
-----   Message from Warren Danes, [place] sent at 07/09/2021 12:46 PM EDT ----- ?Infiltrative.  Mohs. ?

## 2021-07-10 NOTE — Telephone Encounter (Signed)
Phone call from patient returning our call. Patient aware of pathology results. MOH's referral sent to [facility].  ?

## 2021-09-05 ENCOUNTER — Encounter: Payer: BLUE CROSS/BLUE SHIELD | Admitting: Physician Assistant

## 2022-01-31 ENCOUNTER — Encounter: Payer: Self-pay | Admitting: Family Medicine

## 2022-01-31 ENCOUNTER — Ambulatory Visit (INDEPENDENT_AMBULATORY_CARE_PROVIDER_SITE_OTHER): Payer: BLUE CROSS/BLUE SHIELD | Admitting: Family Medicine

## 2022-01-31 ENCOUNTER — Other Ambulatory Visit: Payer: Self-pay | Admitting: Family Medicine

## 2022-01-31 VITALS — BP 134/80 | HR 74 | Temp 98.2°F | Wt 157.6 lb

## 2022-01-31 DIAGNOSIS — J988 Other specified respiratory disorders: Secondary | ICD-10-CM | POA: Insufficient documentation

## 2022-01-31 DIAGNOSIS — J029 Acute pharyngitis, unspecified: Secondary | ICD-10-CM

## 2022-01-31 LAB — POCT RAPID STREP A (OFFICE): Rapid Strep A Screen: NEGATIVE

## 2022-01-31 MED ORDER — PROMETHAZINE-DM 6.25-15 MG/5ML PO SYRP: 5.0000 mL | ORAL_SOLUTION | Freq: Four times a day (QID) | ORAL | 0 refills | Status: DC | PRN

## 2022-01-31 MED ORDER — PREDNISONE 50 MG PO TABS: ORAL_TABLET | ORAL | 0 refills | Status: DC

## 2022-01-31 NOTE — Progress Notes (Signed)
Subjective:  Patient ID: Brenda King, female    DOB: 1961-02-02  Age: 61 y.o. MRN: [id number]  CC: Chief Complaint  Patient presents with   Sore Throat    Pt arrives with sore throat cough and congestion. Began Tuesday night. Cough keeping her up at night    HPI:  61 year old female presents for evaluation of the above.   Patient reports that she has been sick since Tuesday.  She reports severe throat pain and cough.  Cough is keeping her up at night.  Reports chest tightness.  Recent sick contacts.  No fever.  No relief with over-the-counter Advil and cough medication.  Patient Active Problem List   Diagnosis Date Noted   Respiratory infection 01/31/2022   Migraine without aura and without status migrainosus, not intractable 06/03/2017   Chronic idiopathic constipation 02/04/2013   Allergic rhinitis 07/09/2012    Social Hx   Social History   Socioeconomic History   Marital status: Married    Spouse name: Not on file   Number of children: Not on file   Years of education: Not on file   Highest education level: Not on file  Occupational History   Not on file  Tobacco Use   Smoking status: Every Day    Packs/day: 0.25    Years: 30.00    Total pack years: 7.50    Types: Cigarettes   Smokeless tobacco: Former    Quit date: 11/04/2012  Substance and Sexual Activity   Alcohol use: No    Alcohol/week: 0.0 standard drinks of alcohol   Drug use: No   Sexual activity: Yes    Birth control/protection: Surgical  Other Topics Concern   Not on file  Social History Narrative   Not on file   Social Determinants of Health   Financial Resource Strain: Not on file  Food Insecurity: Not on file  Transportation Needs: Not on file  Physical Activity: Not on file  Stress: Not on file  Social Connections: Not on file    Review of Systems Per HPI  Objective:  BP 134/80   Pulse 74   Temp 98.2 F (36.8 C)   Wt 157 lb 9.6 oz (71.5 kg)   SpO2 98%   BMI 22.61 kg/m       01/31/2022    4:06 PM 11/26/2019    1:37 PM 11/15/2019    3:18 PM  BP/Weight  Systolic BP 779 390 300  Diastolic BP 80 74 64  Wt. (Lbs) 157.6 143.6 142.4  BMI 22.61 kg/m2 20.6 kg/m2 20.43 kg/m2    Physical Exam Vitals and nursing note reviewed.  Constitutional:      General: She is not in acute distress. HENT:     Head: Normocephalic and atraumatic.     Right Ear: Tympanic membrane normal.     Left Ear: Tympanic membrane normal.     Mouth/Throat:     Pharynx: Posterior oropharyngeal erythema present. No oropharyngeal exudate.  Eyes:     General:        Right eye: No discharge.        Left eye: No discharge.     Conjunctiva/sclera: Conjunctivae normal.  Cardiovascular:     Rate and Rhythm: Normal rate and regular rhythm.  Pulmonary:     Effort: Pulmonary effort is normal.     Breath sounds: Normal breath sounds. No wheezing or rales.  Neurological:     Mental Status: She is alert.     Lab  Results  Component Value Date   WBC 8.0 09/16/2019   HGB 14.3 09/16/2019   HCT 42.4 09/16/2019   PLT 314 09/16/2019   GLUCOSE 66 09/16/2019   CHOL 161 12/20/2016   TRIG 60 12/20/2016   HDL 61 12/20/2016   LDLCALC 88 12/20/2016   ALT 16 09/16/2019   AST 18 09/16/2019   NA 145 (H) 09/16/2019   K 4.2 09/16/2019   CL 106 09/16/2019   CREATININE 0.75 09/16/2019   BUN 6 09/16/2019   CO2 30 (H) 09/16/2019   TSH 1.720 09/16/2019     Assessment & Plan:   Problem List Items Addressed This Visit       Respiratory   Respiratory infection - Primary    Likely viral.  Rapid strep negative.  Awaiting COVID, Flu, RSV testing.  Treating with prednisone and Promethazine DM.      Other Visit Diagnoses     Sore throat       Relevant Orders   POCT rapid strep A (Completed)   COVID-19, Flu A+B and RSV   Culture, Group A Strep       Meds ordered this encounter  Medications   predniSONE (DELTASONE) 50 MG tablet    Sig: 1 tablet daily x 5 days    Dispense:  5 tablet     Refill:  0   promethazine-dextromethorphan (PROMETHAZINE-DM) 6.25-15 MG/5ML syrup    Sig: Take 5 mLs by mouth 4 (four) times daily as needed for cough.    Dispense:  118 mL    Refill:  0    Follow-up:  Return if symptoms worsen or fail to improve.  [place]

## 2022-01-31 NOTE — Patient Instructions (Signed)
Medication as prescribed.  Rest and lots of fluids.  Take care  Dr. Lacinda Axon

## 2022-01-31 NOTE — Assessment & Plan Note (Signed)
Likely viral.  Rapid strep negative.  Awaiting COVID, Flu, RSV testing.  Treating with prednisone and Promethazine DM.

## 2022-02-02 LAB — COVID-19, FLU A+B AND RSV
Influenza A, NAA: NOT DETECTED
Influenza B, NAA: NOT DETECTED
RSV, NAA: NOT DETECTED
SARS-CoV-2, NAA: NOT DETECTED

## 2022-02-02 LAB — SPECIMEN STATUS REPORT

## 2022-02-04 LAB — CULTURE, GROUP A STREP: Strep A Culture: NEGATIVE

## 2022-02-04 LAB — SPECIMEN STATUS REPORT

## 2022-03-04 ENCOUNTER — Ambulatory Visit (HOSPITAL_COMMUNITY)
Admission: RE | Admit: 2022-03-04 | Discharge: 2022-03-04 | Disposition: A | Discharge status: Home / Self Care | Payer: BLUE CROSS/BLUE SHIELD | Source: Ambulatory Visit | Attending: Family Medicine | Admitting: Family Medicine

## 2022-03-04 ENCOUNTER — Ambulatory Visit (INDEPENDENT_AMBULATORY_CARE_PROVIDER_SITE_OTHER): Payer: BLUE CROSS/BLUE SHIELD | Admitting: Family Medicine

## 2022-03-04 DIAGNOSIS — R059 Cough, unspecified: Secondary | ICD-10-CM | POA: Insufficient documentation

## 2022-03-04 DIAGNOSIS — J988 Other specified respiratory disorders: Secondary | ICD-10-CM

## 2022-03-04 MED ORDER — DICLOFENAC SODIUM 75 MG PO TBEC: 75.0000 mg | DELAYED_RELEASE_TABLET | Freq: Two times a day (BID) | ORAL | 0 refills | Status: DC | PRN

## 2022-03-04 MED ORDER — BACLOFEN 10 MG PO TABS: 10.0000 mg | ORAL_TABLET | Freq: Three times a day (TID) | ORAL | 0 refills | Status: DC | PRN

## 2022-03-04 MED ORDER — HYDROCODONE BIT-HOMATROP MBR 5-1.5 MG/5ML PO SOLN: 5.0000 mL | Freq: Three times a day (TID) | ORAL | 0 refills | Status: DC | PRN

## 2022-03-04 NOTE — Progress Notes (Signed)
Subjective:  Patient ID: Brenda King, female    DOB: October 04, 1960  Age: 61 y.o. MRN: [id number]  CC: Chief Complaint  Patient presents with   Cough   Fever   Chest Pain    Rib pain from coughing   Back Pain    HPI:  61 year old female presents for evaluation of the above.  Patient reports that she has been sick since Friday.  She reports severe cough.  Has also had fever, Tmax 102.  Was seen at a local urgent care and reportedly had negative COVID and flu testing.  No chest x-ray was done.  Patient reports that her cough is severe.  Has not resolved with use of Delsym or prior prescribed cough medication.  She reports that she is having rib pain and right-sided thoracic back pain.  This is likely due to the cough.  No current fever.  No reports of shortness of breath at this time.  Patient Active Problem List   Diagnosis Date Noted   Cough 03/04/2022   Respiratory infection 01/31/2022   Migraine without aura and without status migrainosus, not intractable 06/03/2017   Chronic idiopathic constipation 02/04/2013   Allergic rhinitis 07/09/2012    Social Hx   Social History   Socioeconomic History   Marital status: Married    Spouse name: Not on file   Number of children: Not on file   Years of education: Not on file   Highest education level: Not on file  Occupational History   Not on file  Tobacco Use   Smoking status: Every Day    Packs/day: 0.25    Years: 30.00    Total pack years: 7.50    Types: Cigarettes   Smokeless tobacco: Former    Quit date: 11/04/2012  Substance and Sexual Activity   Alcohol use: No    Alcohol/week: 0.0 standard drinks of alcohol   Drug use: No   Sexual activity: Yes    Birth control/protection: Surgical  Other Topics Concern   Not on file  Social History Narrative   Not on file   Social Determinants of Health   Financial Resource Strain: Not on file  Food Insecurity: Not on file  Transportation Needs: Not on file  Physical  Activity: Not on file  Stress: Not on file  Social Connections: Not on file    Review of Systems Per HPI  Objective:  BP 110/72   Pulse (!) 102   Temp 98.5 F (36.9 C)   Wt 158 lb (71.7 kg)   SpO2 97%   BMI 22.67 kg/m      03/04/2022    5:18 PM 01/31/2022    4:06 PM 11/26/2019    1:37 PM  BP/Weight  Systolic BP 250 539 767  Diastolic BP 72 80 74  Wt. (Lbs) 158 157.6 143.6  BMI 22.67 kg/m2 22.61 kg/m2 20.6 kg/m2    Physical Exam Constitutional:      General: She is not in acute distress. HENT:     Head: Normocephalic and atraumatic.     Mouth/Throat:     Pharynx: Posterior oropharyngeal erythema present.  Eyes:     General:        Right eye: No discharge.        Left eye: No discharge.     Conjunctiva/sclera: Conjunctivae normal.  Cardiovascular:     Rate and Rhythm: Normal rate and regular rhythm.  Pulmonary:     Effort: Pulmonary effort is normal.  Breath sounds: Normal breath sounds. No wheezing, rhonchi or rales.  Neurological:     Mental Status: She is alert.     Lab Results  Component Value Date   WBC 8.0 09/16/2019   HGB 14.3 09/16/2019   HCT 42.4 09/16/2019   PLT 314 09/16/2019   GLUCOSE 66 09/16/2019   CHOL 161 12/20/2016   TRIG 60 12/20/2016   HDL 61 12/20/2016   LDLCALC 88 12/20/2016   ALT 16 09/16/2019   AST 18 09/16/2019   NA 145 (H) 09/16/2019   K 4.2 09/16/2019   CL 106 09/16/2019   CREATININE 0.75 09/16/2019   BUN 6 09/16/2019   CO2 30 (H) 09/16/2019   TSH 1.720 09/16/2019     Assessment & Plan:   Problem List Items Addressed This Visit       Respiratory   Respiratory infection   Relevant Orders   COVID-19, Flu A+B and RSV     Other   Cough    X-ray was obtained and was independently reviewed by me.  Interpretation: Normal chest x-ray.  No evidence of pneumonia.  Hycodan for cough.  Also treating patient's musculoskeletal pain from coughing with baclofen and diclofenac. Awaiting COVID and flu testing.       Relevant Medications   HYDROcodone bit-homatropine (HYCODAN) 5-1.5 MG/5ML syrup   Other Relevant Orders   DG Chest 2 View (Completed)    Meds ordered this encounter  Medications   HYDROcodone bit-homatropine (HYCODAN) 5-1.5 MG/5ML syrup    Sig: Take 5 mLs by mouth every 8 (eight) hours as needed for cough.    Dispense:  120 mL    Refill:  0   baclofen (LIORESAL) 10 MG tablet    Sig: Take 1 tablet (10 mg total) by mouth 3 (three) times daily as needed for muscle spasms.    Dispense:  30 each    Refill:  0   diclofenac (VOLTAREN) 75 MG EC tablet    Sig: Take 1 tablet (75 mg total) by mouth 2 (two) times daily as needed for moderate pain.    Dispense:  30 tablet    Refill:  0    Follow-up:  Return if symptoms worsen or fail to improve.  Brenda King

## 2022-03-04 NOTE — Patient Instructions (Signed)
Xray at the hospital.  We will with results.  Cough medication as directed.  Take care  Dr. Lacinda Axon

## 2022-03-04 NOTE — Assessment & Plan Note (Signed)
X-ray was obtained and was independently reviewed by me.  Interpretation: Normal chest x-ray.  No evidence of pneumonia.  Hycodan for cough.  Also treating patient's musculoskeletal pain from coughing with baclofen and diclofenac. Awaiting COVID and flu testing.

## 2022-03-06 ENCOUNTER — Encounter: Payer: Self-pay | Admitting: Family Medicine

## 2022-03-06 LAB — SPECIMEN STATUS REPORT

## 2022-03-06 LAB — COVID-19, FLU A+B AND RSV
Influenza A, NAA: NOT DETECTED
Influenza B, NAA: NOT DETECTED
RSV, NAA: NOT DETECTED
SARS-CoV-2, NAA: NOT DETECTED

## 2022-03-08 ENCOUNTER — Encounter (HOSPITAL_COMMUNITY): Payer: Self-pay | Admitting: Emergency Medicine

## 2022-03-08 ENCOUNTER — Ambulatory Visit (HOSPITAL_COMMUNITY)
Admission: EM | Admit: 2022-03-08 | Discharge: 2022-03-08 | Disposition: A | Discharge status: Home / Self Care | Payer: BLUE CROSS/BLUE SHIELD | Attending: Emergency Medicine | Admitting: Emergency Medicine

## 2022-03-08 ENCOUNTER — Other Ambulatory Visit: Payer: Self-pay | Admitting: Family Medicine

## 2022-03-08 ENCOUNTER — Telehealth: Payer: Self-pay | Admitting: *Deleted

## 2022-03-08 DIAGNOSIS — J069 Acute upper respiratory infection, unspecified: Secondary | ICD-10-CM

## 2022-03-08 MED ORDER — KETOROLAC TROMETHAMINE 30 MG/ML IJ SOLN
30.0000 mg | Freq: Once | INTRAMUSCULAR | Status: AC
  Administered 2022-03-08: 30 mg via INTRAMUSCULAR

## 2022-03-08 MED ORDER — AZITHROMYCIN 250 MG PO TABS: ORAL_TABLET | ORAL | 0 refills | Status: AC

## 2022-03-08 MED ORDER — KETOROLAC TROMETHAMINE 30 MG/ML IJ SOLN
INTRAMUSCULAR | Status: AC
  Filled 2022-03-08: qty 1

## 2022-03-08 MED ORDER — PREDNISONE 20 MG PO TABS: 40.0000 mg | ORAL_TABLET | Freq: Every day | ORAL | 0 refills | Status: DC

## 2022-03-08 MED ORDER — BENZONATATE 100 MG PO CAPS: 100.0000 mg | ORAL_CAPSULE | Freq: Three times a day (TID) | ORAL | 0 refills | Status: DC

## 2022-03-08 NOTE — Telephone Encounter (Signed)
Cook, Jayce G, DO   ? ?Rx sent.   ? ?

## 2022-03-08 NOTE — ED Provider Notes (Signed)
[facility]    CSN: [id number] Arrival date & time: 03/08/22  1529      History   Chief Complaint Chief Complaint  Patient presents with   Breast Pain   Cough    HPI FELISA ZECHMAN is a 61 y.o. female.   Patient presents for evaluation of fever, congestion, cough or throat for 8 days.  Resolved 5 days ago.  Was evaluated in urgent care initially, COVID, flu testing negative, was reevaluated 3 days later by PCP, chest x-ray obtained, negative at that time.  Patient endorses that cough has become severe and is causing left-sided breast pain which is worsened by all movement and deep breathing, interfering with sleep.  Sent MyChart message to PCP this morning, Z-Pak was sent to pharmacy, has not started treatment.  Denies shortness of breath or wheezing.   Past Medical History:  Diagnosis Date   Anxiety    Atrial fibrillation [facility])    Status post ablation 2004 and 2005 - Dr. Ola Spurr [facility])   Constipation    Degenerative disc disease, cervical    Menopause    Nodular infiltrative basal cell carcinoma (BCC) 07/04/2021   Left Shoulder    Patient Active Problem List   Diagnosis Date Noted   Cough 03/04/2022   Respiratory infection 01/31/2022   Migraine without aura and without status migrainosus, not intractable 06/03/2017   Chronic idiopathic constipation 02/04/2013   Allergic rhinitis 07/09/2012    Past Surgical History:  Procedure Laterality Date   ABDOMINAL HYSTERECTOMY     BREAST ENHANCEMENT SURGERY  2001    CARDIAC ELECTROPHYSIOLOGY MAPPING AND ABLATION     CYSTOSCOPY W/ RETROGRADES Bilateral 04/17/2016   Procedure: CYSTOSCOPY WITH BILATERAL RETROGRADE PYELOGRAMS;  Surgeon: Cleon Gustin, MD;  Location: [facility];  Service: Urology;  Laterality: Bilateral;   PARTIAL HYSTERECTOMY  2000     OB History   No obstetric history on file.      Home Medications    Prior to Admission medications   Medication Sig Start Date End Date Taking?  Authorizing Provider  ALPRAZolam Duanne Moron) 0.5 MG tablet Take 0.5 mg by mouth 3 (three) times daily. 08/17/20   [provider]  azithromycin (ZITHROMAX) 250 MG tablet Take 2 tablets on day 1, then 1 tablet daily on days 2 through 5 03/08/22 03/13/22  Cook, Barnie Del, DO  baclofen (LIORESAL) 10 MG tablet Take 1 tablet (10 mg total) by mouth 3 (three) times daily as needed for muscle spasms. 03/04/22   Coral Spikes, DO  diclofenac (VOLTAREN) 75 MG EC tablet Take 1 tablet (75 mg total) by mouth 2 (two) times daily as needed for moderate pain. 03/04/22   Coral Spikes, DO  escitalopram (LEXAPRO) 10 MG tablet Take 10 mg by mouth daily. 02/25/19   [provider]  estradiol (ESTRACE) 2 MG tablet Take 2 mg by mouth daily.    [provider]  Estradiol 0.75 MG/1.25 GM (0.06%) topical gel Place onto the skin.    [provider]  HYDROcodone bit-homatropine (HYCODAN) 5-1.5 MG/5ML syrup Take 5 mLs by mouth every 8 (eight) hours as needed for cough. 03/04/22   Coral Spikes, DO  ondansetron (ZOFRAN) 4 MG tablet Take 1 tablet (4 mg total) by mouth every 8 (eight) hours as needed for nausea or vomiting. 09/25/20   Regal, Tamala Fothergill, DPM  progesterone (PROMETRIUM) 100 MG capsule Take 100 mg by mouth at bedtime. 06/30/20   [provider]  Family History Family History  Problem Relation Age of Onset   CAD Father    Heart disease Father    CAD Brother    Cancer Other        breast    Social History Social History   Tobacco Use   Smoking status: Every Day    Packs/day: 0.25    Years: 30.00    Total pack years: 7.50    Types: Cigarettes   Smokeless tobacco: Former    Quit date: 11/04/2012  Substance Use Topics   Alcohol use: No    Alcohol/week: 0.0 standard drinks of alcohol   Drug use: No     Allergies   Docusate sodium, Miralax [polyethylene glycol], Other, Bisacodyl, Chlorzoxazone, Latex, and Topamax [topiramate]   Review of Systems Review of  Systems  Constitutional: Negative.   HENT: Negative.    Respiratory:  Positive for cough. Negative for apnea, choking, chest tightness, shortness of breath, wheezing and stridor.   Cardiovascular: Negative.   Gastrointestinal: Negative.   Skin: Negative.   Neurological: Negative.      Physical Exam Triage Vital Signs ED Triage Vitals  Enc Vitals Group     BP 03/08/22 1718 112/68     Pulse Rate 03/08/22 1718 80     Resp 03/08/22 1718 16     Temp 03/08/22 1718 (!) 97.5 F (36.4 C)     Temp Source 03/08/22 1718 Oral     SpO2 03/08/22 1718 96 %     Weight --      Height --      Head Circumference --      Peak Flow --      Pain Score 03/08/22 1716 6     Pain Loc --      Pain Edu? --      Excl. in [place]? --    No data found.  Updated Vital Signs BP 112/68 (BP Location: Right Arm)   Pulse 80   Temp (!) 97.5 F (36.4 C) (Oral)   Resp 16   SpO2 96%   Visual Acuity Right Eye Distance:   Left Eye Distance:   Bilateral Distance:    Right Eye Near:   Left Eye Near:    Bilateral Near:     Physical Exam Constitutional:      Appearance: Normal appearance.  HENT:     Head: Normocephalic.     Right Ear: Tympanic membrane, ear canal and external ear normal.     Left Ear: Tympanic membrane, ear canal and external ear normal.     Nose: Congestion and rhinorrhea present.     Mouth/Throat:     Mouth: Mucous membranes are moist.     Pharynx: No posterior oropharyngeal erythema.  Cardiovascular:     Rate and Rhythm: Normal rate and regular rhythm.     Pulses: Normal pulses.     Heart sounds: Normal heart sounds.  Pulmonary:     Effort: Pulmonary effort is normal.     Breath sounds: Normal breath sounds.  Chest:     Comments: Tenderness is present along the left chest wall primarily over ribs 3 through 6, no ecchymosis, swelling or deformity present, chest wall is symmetrical Neurological:     Mental Status: She is alert and oriented to person, place, and time.      UC  Treatments / Results  Labs (all labs ordered are listed, but only abnormal results are displayed) Labs Reviewed - No data to display  EKG  Radiology No results found.  Procedures Procedures (including critical care time)  Medications Ordered in UC Medications - No data to display  Initial Impression / Assessment and Plan / UC Course  I have reviewed the triage vital signs and the nursing notes.  Pertinent labs & imaging results that were available during my care of the patient were reviewed by me and considered in my medical decision making (see chart for details).  Acute upper respiratory infection  Etiology of pain is most likely muscular, discussed with patient, lungs are clear and O2 saturation is greater than 96%, low suspicion for pneumonia or bronchitis at this time, azithromycin prescribed by PCP and encourage patient to start use of medication, patient also has a Promethazine DM and Hycodan for home, will additionally prescribe Tessalon as she endorses success with this medicine in the past and is hesitant to begin use of Hycodan, prescribed prednisone additionally to help with pain, may use over-the-counter Tylenol for support as well as splinting with a pillow and heating pads, may follow-up with his urgent care as needed for reevaluation Final Clinical Impressions(s) / UC Diagnoses   Final diagnoses:  None   Discharge Instructions   None    ED Prescriptions   None    PDMP not reviewed this encounter.   Hans Eden, NP 03/08/22 1931

## 2022-03-08 NOTE — ED Triage Notes (Signed)
Pt had cough for 8 days. Cough now producing green phlegm. Seen at [facility] and got cough syrup, Monday went to PCP had covid, flu testing which is all negative.  Pt reports about 3am pain in left breast area when coughing or taking a deep breath.

## 2022-03-08 NOTE — Telephone Encounter (Signed)
Patient called to state she is on day 8 of illness and she is still coughing a lot with congestion and now her sputum is green. Patient states she has coughed so much she is having pain under her left breast  Patient states at this point she thinks she needs antibiotic and a z pack always works good for her  [facility]

## 2022-03-08 NOTE — Discharge Instructions (Signed)
Today you are being treated for upper respiratory infection, at this time I have a low suspicion of pneumonia as your lungs are clear and you are getting enough air without assistance, the pain that you are experiencing is muscular as I am able to reproduce it on exam, low suspicion of more serious cardiac or respiratory involvement at this time  You have been given an injection of Toradol here today in the office to reduce inflammation that occurs with muscle injury, ideally you will start to see some relief in about 30 minutes  Starting tomorrow take prednisone every morning for 5 days to continue above process, you may take Tylenol in addition to this as needed  You may use your Hycodan cough medicine at bedtime, this is a strong narcotic and will give you relief enough so that you are able to sleep while suppressing your cough  You may use Tessalon pill 1 to 2 tablets every 8 hours throughout the day for additional management  Begin use of azithromycin antibiotic prescribed by your doctor which will provide coverage for bacteria which may be prolonging your symptoms    You can take Tylenol and/or Ibuprofen as needed for fever reduction and pain relief.   For cough: honey 1/2 to 1 teaspoon (you can dilute the honey in water or another fluid).  You can also use guaifenesin and dextromethorphan for cough. You can use a humidifier for chest congestion and cough.  If you don't have a humidifier, you can sit in the bathroom with the hot shower running.      For sore throat: try warm salt water gargles, cepacol lozenges, throat spray, warm tea or water with lemon/honey, popsicles or ice, or OTC cold relief medicine for throat discomfort.   For congestion: take a daily anti-histamine like Zyrtec, Claritin, and a oral decongestant, such as pseudoephedrine.  You can also use Flonase 1-2 sprays in each nostril daily.   It is important to stay hydrated: drink plenty of fluids (water,  gatorade/powerade/pedialyte, juices, or teas) to keep your throat moisturized and help further relieve irritation/discomfort.

## 2022-03-08 NOTE — Telephone Encounter (Signed)
Patient notified and stated she has coughed so much till her side is killing her and it hurts to breath. Patient is heading to urgent care for further evaluation

## 2022-03-19 ENCOUNTER — Other Ambulatory Visit (HOSPITAL_COMMUNITY): Payer: Self-pay | Admitting: Obstetrics & Gynecology

## 2022-03-19 ENCOUNTER — Other Ambulatory Visit: Payer: Self-pay | Admitting: Obstetrics and Gynecology

## 2022-03-19 ENCOUNTER — Ambulatory Visit (HOSPITAL_COMMUNITY)
Admission: RE | Admit: 2022-03-19 | Discharge: 2022-03-19 | Disposition: A | Discharge status: Home / Self Care | Payer: BLUE CROSS/BLUE SHIELD | Source: Ambulatory Visit | Attending: Obstetrics & Gynecology | Admitting: Obstetrics & Gynecology

## 2022-03-19 DIAGNOSIS — N644 Mastodynia: Secondary | ICD-10-CM

## 2022-03-19 DIAGNOSIS — R0789 Other chest pain: Secondary | ICD-10-CM | POA: Insufficient documentation

## 2022-03-22 ENCOUNTER — Encounter: Payer: Self-pay | Admitting: Family Medicine

## 2022-03-22 ENCOUNTER — Ambulatory Visit: Payer: BLUE CROSS/BLUE SHIELD | Admitting: Family Medicine

## 2022-03-22 ENCOUNTER — Ambulatory Visit (INDEPENDENT_AMBULATORY_CARE_PROVIDER_SITE_OTHER): Payer: BLUE CROSS/BLUE SHIELD | Admitting: Family Medicine

## 2022-03-22 VITALS — BP 110/68 | HR 77 | Temp 98.1°F | Wt 160.2 lb

## 2022-03-22 DIAGNOSIS — J90 Pleural effusion, not elsewhere classified: Secondary | ICD-10-CM | POA: Diagnosis not present

## 2022-03-22 NOTE — Progress Notes (Signed)
Subjective:  Patient ID: Brenda King, female    DOB: 02-May-1960  Age: 62 y.o. MRN: [id number]  CC: Chief Complaint  Patient presents with   Chest Pain    Pt arrives due to ongoing chest pain. Pt unable to take deep breath. Pt has been to Urgent Care multiple times and has been seen in the office. Pain is under both breast. Has also seen OBGYN. Pt had chest xray and OBGYN stated it looked different. Still has cough-productive cough of green mucus.     HPI:  62 year old female presents for evaluation of the above.   Patient recently seen by me on 12/18 with cough and fever. Had negative Flu, COVID and RSV testing. Chest xray negative. Was treated with Hycodan. Also given Diclofenac and baclofen for MSK pain (from cough).  Subsequently messaged me and was started on Azithromycin.   Experienced worsening cough, pleuritic pain, and left side breast pain and went to UC for evaluation. Given Toradol and sent home on Tessalon perles and Prednisone.  Symptoms continued to persist and she was seen by OB/GYN twice. Xray obtained again on 1/2. Xray showed minimal blunting of left costophrenic angle.   Patient presents today for further evaluation. Continues to have severe pain underneath the breasts. Worse with activity/movement. Still has cough as well. Trouble taking a deep breath. No fever.   Patient Active Problem List   Diagnosis Date Noted   Pleural effusion 03/22/2022   Migraine without aura and without status migrainosus, not intractable 06/03/2017   Chronic idiopathic constipation 02/04/2013   Allergic rhinitis 07/09/2012    Social Hx   Social History   Socioeconomic History   Marital status: Married    Spouse name: Not on file   Number of children: Not on file   Years of education: Not on file   Highest education level: Not on file  Occupational History   Not on file  Tobacco Use   Smoking status: Every Day    Packs/day: 0.25    Years: 30.00    Total pack years: 7.50     Types: Cigarettes   Smokeless tobacco: Former    Quit date: 11/04/2012  Substance and Sexual Activity   Alcohol use: No    Alcohol/week: 0.0 standard drinks of alcohol   Drug use: No   Sexual activity: Yes    Birth control/protection: Surgical  Other Topics Concern   Not on file  Social History Narrative   Not on file   Social Determinants of Health   Financial Resource Strain: Not on file  Food Insecurity: Not on file  Transportation Needs: Not on file  Physical Activity: Not on file  Stress: Not on file  Social Connections: Not on file    Review of Systems Per HPI  Objective:  BP 110/68   Pulse 77   Temp 98.1 F (36.7 C)   Wt 160 lb 3.2 oz (72.7 kg)   SpO2 98%   BMI 22.99 kg/m      03/22/2022    4:42 PM 03/08/2022    5:18 PM 03/04/2022    5:18 PM  BP/Weight  Systolic BP 086 761 950  Diastolic BP 68 68 72  Wt. (Lbs) 160.2  158  BMI 22.99 kg/m2  22.67 kg/m2    Physical Exam Vitals and nursing note reviewed.  Constitutional:      General: She is not in acute distress.    Appearance: She is well-developed.  HENT:  Head: Normocephalic and atraumatic.  Cardiovascular:     Rate and Rhythm: Normal rate and regular rhythm.  Pulmonary:     Effort: Pulmonary effort is normal.     Breath sounds: Normal breath sounds. No wheezing or rales.  Neurological:     Mental Status: She is alert.  Psychiatric:     Comments: Anxious.     Lab Results  Component Value Date   WBC 8.0 09/16/2019   HGB 14.3 09/16/2019   HCT 42.4 09/16/2019   PLT 314 09/16/2019   GLUCOSE 66 09/16/2019   CHOL 161 12/20/2016   TRIG 60 12/20/2016   HDL 61 12/20/2016   LDLCALC 88 12/20/2016   ALT 16 09/16/2019   AST 18 09/16/2019   NA 145 (H) 09/16/2019   K 4.2 09/16/2019   CL 106 09/16/2019   CREATININE 0.75 09/16/2019   BUN 6 09/16/2019   CO2 30 (H) 09/16/2019   TSH 1.720 09/16/2019     Assessment & Plan:   Problem List Items Addressed This Visit       Respiratory    Pleural effusion - Primary    CT chest for further evaluation. Offered additional pain medication to aid MSK pain. Patient declined stating that she will use Diclofenac previously prescribed. Advise heat and rest.  Attempted to get CT scan done today but this was unable to be done due to the time.  Needs prior authorization and insurance line not available after 5 pm. Will arrange for Monday.       Relevant Orders   CT Chest W Contrast (Completed)   Follow-up:  Pending CT findings.  [place]

## 2022-03-22 NOTE — Assessment & Plan Note (Signed)
CT chest for further evaluation. Offered additional pain medication to aid MSK pain. Patient declined stating that she will use Diclofenac previously prescribed. Advise heat and rest.  Attempted to get CT scan done today but this was unable to be done due to the time.  Needs prior authorization and insurance line not available after 5 pm. Will arrange for Monday.

## 2022-03-22 NOTE — Patient Instructions (Signed)
We will call about the CT.  Use the diclofenac.   Rest, heat.

## 2022-03-25 ENCOUNTER — Encounter (HOSPITAL_COMMUNITY): Payer: Self-pay | Admitting: Radiology

## 2022-03-25 ENCOUNTER — Ambulatory Visit (HOSPITAL_COMMUNITY)
Admission: RE | Admit: 2022-03-25 | Discharge: 2022-03-25 | Disposition: A | Discharge status: Home / Self Care | Payer: BLUE CROSS/BLUE SHIELD | Source: Ambulatory Visit | Attending: Family Medicine | Admitting: Family Medicine

## 2022-03-25 DIAGNOSIS — J90 Pleural effusion, not elsewhere classified: Secondary | ICD-10-CM | POA: Insufficient documentation

## 2022-03-25 MED ORDER — IOHEXOL 300 MG/ML  SOLN
75.0000 mL | Freq: Once | INTRAMUSCULAR | Status: AC | PRN
  Administered 2022-03-25: 75 mL via INTRAVENOUS

## 2022-03-26 ENCOUNTER — Encounter: Payer: Self-pay | Admitting: Family Medicine

## 2022-03-31 ENCOUNTER — Other Ambulatory Visit: Payer: Self-pay | Admitting: Family Medicine

## 2022-04-03 ENCOUNTER — Telehealth: Payer: Self-pay

## 2022-04-03 NOTE — Telephone Encounter (Signed)
Please advise. Thank you

## 2022-04-03 NOTE — Telephone Encounter (Signed)
Type of form received:FMLA   Additional comments: Pt dropped off FMLA her work is on her ready to let her go and said they need this back asap   Received by:Kenzi Bardwell  Form should be Faxed/mailed to: (address/ fax [phone]  Is patient requesting call for pickup:Yes  Form placed:  In Dr Lacinda Axon folder up front   Attach charge sheet.  Provider will determine charge.20.00 paid   Individual made aware of 3-5 business day turn around Yes

## 2022-04-24 ENCOUNTER — Other Ambulatory Visit: Payer: Self-pay | Admitting: Family Medicine

## 2022-05-03 ENCOUNTER — Ambulatory Visit (INDEPENDENT_AMBULATORY_CARE_PROVIDER_SITE_OTHER): Payer: BLUE CROSS/BLUE SHIELD | Admitting: Family Medicine

## 2022-05-03 ENCOUNTER — Telehealth: Payer: BLUE CROSS/BLUE SHIELD | Admitting: Family Medicine

## 2022-05-03 ENCOUNTER — Other Ambulatory Visit: Payer: Self-pay

## 2022-05-03 DIAGNOSIS — U071 COVID-19: Secondary | ICD-10-CM | POA: Insufficient documentation

## 2022-05-03 NOTE — Progress Notes (Signed)
Subjective:  Patient ID: Brenda King, female    DOB: [birth year]  Age: 62 y.o. MRN: [id number]  CC: Chief Complaint  Patient presents with   pleural effusion     Follow up , coughing up small amounts of phlegm 4 to 5 times per day    HPI:  62 year old female presents to have FMLA form filled out.  Patient recently got sick and was diagnosed with COVID-19.  This was done via an outside urgent care.  Tested positive on 2/11.  Patient is feeling better but is feeling fatigued.  She is completing course of Paxlovid.  Patient needs FMLA to be filled out due to missing work.  Patient Active Problem List   Diagnosis Date Noted   COVID 05/03/2022   Migraine without aura and without status migrainosus, not intractable 06/03/2017   Chronic idiopathic constipation 02/04/2013   Allergic rhinitis 07/09/2012    Social Hx   Social History   Socioeconomic History   Marital status: Married    Spouse name: Not on file   Number of children: Not on file   Years of education: Not on file   Highest education level: Not on file  Occupational History   Not on file  Tobacco Use   Smoking status: Every Day    Packs/day: 0.25    Years: 30.00    Total pack years: 7.50    Types: Cigarettes   Smokeless tobacco: Former    Quit date: 11/04/2012  Substance and Sexual Activity   Alcohol use: No    Alcohol/week: 0.0 standard drinks of alcohol   Drug use: No   Sexual activity: Yes    Birth control/protection: Surgical  Other Topics Concern   Not on file  Social History Narrative   Not on file   Social Determinants of Health   Financial Resource Strain: Not on file  Food Insecurity: Not on file  Transportation Needs: Not on file  Physical Activity: Not on file  Stress: Not on file  Social Connections: Not on file    Review of Systems Per HPI  Objective:  There were no vitals taken for this visit.     03/22/2022    4:42 PM 03/08/2022    5:18 PM 03/04/2022    5:18 PM  BP/Weight   Systolic BP [id number]  Diastolic BP 68 68 72  Wt. (Lbs) 160.2  158  BMI 22.99 kg/m2  22.67 kg/m2    Physical Exam Vitals and nursing note reviewed.  Constitutional:      General: She is not in acute distress.    Appearance: Normal appearance.  Cardiovascular:     Rate and Rhythm: Normal rate and regular rhythm.  Pulmonary:     Effort: Pulmonary effort is normal.     Breath sounds: Normal breath sounds. No wheezing or rales.  Neurological:     Mental Status: She is alert.  Psychiatric:        Mood and Affect: Mood normal.        Behavior: Behavior normal.     Lab Results  Component Value Date   WBC 8.0 09/16/2019   HGB 14.3 09/16/2019   HCT 42.4 09/16/2019   PLT 314 09/16/2019   GLUCOSE 66 09/16/2019   CHOL 161 12/20/2016   TRIG 60 12/20/2016   HDL 61 12/20/2016   LDLCALC 88 12/20/2016   ALT 16 09/16/2019   AST 18 09/16/2019   NA 145 (H) 09/16/2019   K 4.2 09/16/2019  CL 106 09/16/2019   CREATININE 0.75 09/16/2019   BUN 6 09/16/2019   CO2 30 (H) 09/16/2019   TSH 1.720 09/16/2019     Assessment & Plan:   Problem List Items Addressed This Visit       Other   COVID - Primary    Doing well.  Finish Paxlovid.  Patient excuse from work due to illness and need to quarantine.  FMLA form filled out.  FMLA will be faxed by our office staff.  Patient given copy.       [place]

## 2022-05-03 NOTE — Assessment & Plan Note (Signed)
Doing well.  Finish Paxlovid.  Patient excuse from work due to illness and need to quarantine.  FMLA form filled out.  FMLA will be faxed by our office staff.  Patient given copy.

## 2022-07-11 ENCOUNTER — Ambulatory Visit (INDEPENDENT_AMBULATORY_CARE_PROVIDER_SITE_OTHER): Payer: BLUE CROSS/BLUE SHIELD | Admitting: Podiatry

## 2022-07-11 ENCOUNTER — Ambulatory Visit (INDEPENDENT_AMBULATORY_CARE_PROVIDER_SITE_OTHER): Payer: BLUE CROSS/BLUE SHIELD

## 2022-07-11 DIAGNOSIS — M722 Plantar fascial fibromatosis: Secondary | ICD-10-CM

## 2022-07-11 DIAGNOSIS — M79671 Pain in right foot: Secondary | ICD-10-CM

## 2022-07-11 DIAGNOSIS — M79672 Pain in left foot: Secondary | ICD-10-CM

## 2022-07-11 MED ORDER — TRIAMCINOLONE ACETONIDE 10 MG/ML IJ SUSP
20.0000 mg | Freq: Once | INTRAMUSCULAR | Status: AC
  Administered 2022-07-11: 20 mg

## 2022-07-11 MED ORDER — PREDNISONE 10 MG PO TABS: ORAL_TABLET | ORAL | 0 refills | Status: DC

## 2022-07-11 NOTE — Progress Notes (Signed)
Subjective:   Patient ID: Brenda King, female   DOB: 62 y.o.   MRN: [id number]   HPI Patient presents with a lot of pain in the arch of both feet and into the heel stated it started all of a sudden over the last month.  Patient does not remember significant injury or other pathology and did do well from surgery of several years ago   ROS      Objective:  Physical Exam  Neuro vascular status intact with quite a bit of inflammation of the mid arch area bilateral fluid buildup with also pain in the heels not to the same degree      Assessment:  Inflammatory condition appears to be inflammation of the mid band of the plantar fascia bilateral with pain     Plan:  Reviewed condition at this point I reviewed x-rays then did sterile prep and injected the mid arch area bilateral 3 mg Kenalog 5 mg Xylocaine and placed on 12-day Sterapred DS Dosepak.  Reappoint to recheck again in the next few weeks continue to wear support shoes will be seen back to see response  X-rays indicate moderate depression of the arch no other indications of pathology first MPJ which was fixed several years ago appears stable and looks good

## 2022-07-16 ENCOUNTER — Other Ambulatory Visit: Payer: Self-pay | Admitting: Podiatry

## 2022-07-16 DIAGNOSIS — M79671 Pain in right foot: Secondary | ICD-10-CM

## 2022-07-16 DIAGNOSIS — M722 Plantar fascial fibromatosis: Secondary | ICD-10-CM

## 2022-07-17 ENCOUNTER — Ambulatory Visit: Payer: BLUE CROSS/BLUE SHIELD | Admitting: Podiatry

## 2022-07-19 ENCOUNTER — Encounter: Payer: Self-pay | Admitting: Podiatry

## 2022-07-26 ENCOUNTER — Ambulatory Visit (INDEPENDENT_AMBULATORY_CARE_PROVIDER_SITE_OTHER): Payer: BLUE CROSS/BLUE SHIELD | Admitting: Podiatry

## 2022-07-26 ENCOUNTER — Encounter: Payer: Self-pay | Admitting: Podiatry

## 2022-07-26 DIAGNOSIS — M722 Plantar fascial fibromatosis: Secondary | ICD-10-CM

## 2022-07-26 MED ORDER — DICLOFENAC SODIUM 75 MG PO TBEC: 75.0000 mg | DELAYED_RELEASE_TABLET | Freq: Two times a day (BID) | ORAL | 2 refills | Status: DC

## 2022-07-29 NOTE — Progress Notes (Signed)
Subjective:   Patient ID: Brenda King, female   DOB: 62 y.o.   MRN: [id number]   HPI Patient presents stating she has had some improvement but continues to have a lot of pain in her arches especially after periods of sitting and when getting up   ROS      Objective:  Physical Exam  Neurovasc status intact moderate reduction of the acute pain in her mid arch area bilateral but does have still quite a bit of discomfort and is on her feet for extended periods of time with pain worse when getting up in the morning after periods of sitting     Assessment:  Continuing to deal with acute fascial inflammation mostly after periods of sitting     Plan:  H&P reviewed recommended heat ice therapy that I educated her on and I dispensed a prefabricated static night splint properly fitted to her lower leg to provide for consistent stretching mechanism.  Patient will be seen back to recheck all questions answered today

## 2022-09-23 ENCOUNTER — Ambulatory Visit: Payer: BLUE CROSS/BLUE SHIELD | Admitting: Nurse Practitioner

## 2022-10-01 ENCOUNTER — Ambulatory Visit (INDEPENDENT_AMBULATORY_CARE_PROVIDER_SITE_OTHER): Payer: BLUE CROSS/BLUE SHIELD | Admitting: Family Medicine

## 2022-10-01 VITALS — BP 118/78 | HR 69 | Temp 97.9°F | Ht 70.0 in | Wt 162.0 lb

## 2022-10-01 DIAGNOSIS — G43009 Migraine without aura, not intractable, without status migrainosus: Secondary | ICD-10-CM | POA: Diagnosis not present

## 2022-10-01 DIAGNOSIS — M545 Low back pain, unspecified: Secondary | ICD-10-CM | POA: Diagnosis not present

## 2022-10-01 MED ORDER — RIZATRIPTAN BENZOATE 10 MG PO TBDP: 10.0000 mg | ORAL_TABLET | ORAL | 3 refills | Status: DC | PRN

## 2022-10-01 MED ORDER — NURTEC 75 MG PO TBDP: 75.0000 mg | ORAL_TABLET | ORAL | 1 refills | Status: DC

## 2022-10-01 NOTE — Patient Instructions (Signed)
Rest, Heat.  Medication sent. Awaiting insurance approval/prior authorization.  Xray at the hospital.

## 2022-10-02 DIAGNOSIS — M545 Low back pain, unspecified: Secondary | ICD-10-CM | POA: Insufficient documentation

## 2022-10-02 NOTE — Assessment & Plan Note (Signed)
X-ray for further evaluation. 

## 2022-10-02 NOTE — Assessment & Plan Note (Addendum)
Chronic problem, worsening.  Maxalt refilled.  Will see if we can get Nurtec covered for prophylaxis.

## 2022-10-02 NOTE — Progress Notes (Signed)
Subjective:  Patient ID: Brenda King, female    DOB: 29-Aug-1960  Age: 62 y.o. MRN: [id number]  CC: Chief Complaint  Patient presents with   bad headaches    1 month now - clusters    HPI:  62 year old female presents for evaluation of the above.  Patient has longstanding history of migraine headache.  She reports that over the past months she has had worsening and more frequent migraines.  She reports associated posterior neck pain.  Occurs primarily in the frontal region.  She states that she feels like she has a lump on her forehead when the headaches occur.  She takes Excedrin Migraine and Maxalt.  They are occurring so frequently that she is interested in prophylaxis.  Patient also reports recent development of low back pain.  Located in the midline.  She has used Advil, diclofenac, TENS unit, and other home remedies without resolution.  No radicular symptoms.  She reports decreased range of motion.  No recent fall, trauma, injury.   Patient Active Problem List   Diagnosis Date Noted   Midline low back pain without sciatica 10/02/2022   Migraine without aura and without status migrainosus, not intractable 06/03/2017   Chronic idiopathic constipation 02/04/2013   Allergic rhinitis 07/09/2012    Social Hx   Social History   Socioeconomic History   Marital status: Married    Spouse name: Not on file   Number of children: Not on file   Years of education: Not on file   Highest education level: Not on file  Occupational History   Not on file  Tobacco Use   Smoking status: Every Day    Current packs/day: 0.25    Average packs/day: 0.3 packs/day for 30.0 years (7.5 ttl pk-yrs)    Types: Cigarettes   Smokeless tobacco: Former    Quit date: 11/04/2012  Substance and Sexual Activity   Alcohol use: No    Alcohol/week: 0.0 standard drinks of alcohol   Drug use: No   Sexual activity: Yes    Birth control/protection: Surgical  Other Topics Concern   Not on file  Social  History Narrative   Not on file   Social Determinants of Health   Financial Resource Strain: Not on file  Food Insecurity: Not on file  Transportation Needs: Not on file  Physical Activity: Not on file  Stress: Not on file  Social Connections: Not on file    Review of Systems Per HPI  Objective:  BP 118/78   Pulse 69   Temp 97.9 F (36.6 C)   Ht 5\' 10"  (1.778 m)   Wt 162 lb (73.5 kg)   SpO2 97%   BMI 23.24 kg/m      10/01/2022    1:07 PM 03/22/2022    4:42 PM 03/08/2022    5:18 PM  BP/Weight  Systolic BP 118 110 112  Diastolic BP 78 68 68  Wt. (Lbs) 162 160.2   BMI 23.24 kg/m2 22.99 kg/m2     Physical Exam Constitutional:      General: She is not in acute distress.    Appearance: Normal appearance.  HENT:     Head: Normocephalic and atraumatic.  Cardiovascular:     Rate and Rhythm: Normal rate and regular rhythm.  Pulmonary:     Effort: Pulmonary effort is normal.     Breath sounds: Normal breath sounds. No wheezing, rhonchi or rales.  Musculoskeletal:     Comments: Midline tenderness over the lumbar spine and  sacrum.  Neurological:     General: No focal deficit present.     Mental Status: She is alert.     Lab Results  Component Value Date   WBC 8.0 09/16/2019   HGB 14.3 09/16/2019   HCT 42.4 09/16/2019   PLT 314 09/16/2019   GLUCOSE 66 09/16/2019   CHOL 161 12/20/2016   TRIG 60 12/20/2016   HDL 61 12/20/2016   LDLCALC 88 12/20/2016   ALT 16 09/16/2019   AST 18 09/16/2019   NA 145 (H) 09/16/2019   K 4.2 09/16/2019   CL 106 09/16/2019   CREATININE 0.75 09/16/2019   BUN 6 09/16/2019   CO2 30 (H) 09/16/2019   TSH 1.720 09/16/2019     Assessment & Plan:   Problem List Items Addressed This Visit       Cardiovascular and Mediastinum   Migraine without aura and without status migrainosus, not intractable    Chronic problem, worsening.  Maxalt refilled.  Will see if we can get Nurtec covered for prophylaxis.      Relevant Medications    butalbital-acetaminophen-caffeine (FIORICET) 50-325-40 MG tablet   Aspirin-Acetaminophen-Caffeine (EXCEDRIN MIGRAINE PO)   Rimegepant Sulfate (NURTEC) 75 MG TBDP   rizatriptan (MAXALT-MLT) 10 MG disintegrating tablet     Other   Midline low back pain without sciatica - Primary    X-ray for further evaluation.      Relevant Medications   butalbital-acetaminophen-caffeine (FIORICET) 50-325-40 MG tablet   Aspirin-Acetaminophen-Caffeine (EXCEDRIN MIGRAINE PO)   Other Relevant Orders   DG Lumbar Spine Complete    Meds ordered this encounter  Medications   Rimegepant Sulfate (NURTEC) 75 MG TBDP    Sig: Take 1 tablet (75 mg total) by mouth every other day.    Dispense:  45 tablet    Refill:  1   rizatriptan (MAXALT-MLT) 10 MG disintegrating tablet    Sig: Take 1 tablet (10 mg total) by mouth as needed.    Dispense:  10 tablet    Refill:  3    Follow-up:  Pending xray results  Letecia Arps Adriana Simas DO [facility]

## 2022-10-04 ENCOUNTER — Encounter: Payer: Self-pay | Admitting: Podiatry

## 2022-10-04 ENCOUNTER — Ambulatory Visit (INDEPENDENT_AMBULATORY_CARE_PROVIDER_SITE_OTHER): Payer: BLUE CROSS/BLUE SHIELD | Admitting: Podiatry

## 2022-10-04 DIAGNOSIS — M722 Plantar fascial fibromatosis: Secondary | ICD-10-CM | POA: Diagnosis not present

## 2022-10-04 MED ORDER — MELOXICAM 7.5 MG PO TABS: 7.5000 mg | ORAL_TABLET | Freq: Every day | ORAL | 0 refills | Status: DC

## 2022-10-04 MED ORDER — METHYLPREDNISOLONE 4 MG PO TBPK: ORAL_TABLET | ORAL | 0 refills | Status: DC

## 2022-10-04 NOTE — Progress Notes (Unsigned)
  Subjective:  Patient ID: Brenda King, female    DOB: [birth year],  MRN: [id number]  Chief Complaint  Patient presents with   Foot Pain    Bilateral plantar fibromas. Painful. Patient received injections at last visit and she returned to work wearing steel toe shoes. Patient has been doing stem wave therapy for her feet.     62 y.o. female presents with concern for bilateral plantar fibromas as well as heel pain.  Past Medical History:  Diagnosis Date   Anxiety    Atrial fibrillation [facility])    Status post ablation 2004 and 2005 - Dr. Sampson Goon [facility])   Constipation    Degenerative disc disease, cervical    Menopause    Nodular infiltrative basal cell carcinoma (BCC) 07/04/2021   Left Shoulder    Allergies  Allergen Reactions   Docusate Sodium Hives and Shortness Of Breath   Miralax [Polyethylene Glycol] Hives and Shortness Of Breath   Other Hives    ANY laxative with "LAX"   Bisacodyl Other (See Comments)   Chlorzoxazone     Migraine headache and nausea   Latex Itching   Topamax [Topiramate]     nausea    ROS: Negative except as per HPI above  Objective:  General: AAO x3, NAD  Dermatological: Significantly tender to palpation with plantar fibromas present on the bilateral plantar midfoot.    Vascular:  Dorsalis Pedis artery and Posterior Tibial artery pedal pulses are 2/4 bilateral.  Capillary fill time < 3 sec to all digits.   Neruologic: Grossly intact via light touch bilateral. Protective threshold intact to all sites bilateral.   Musculoskeletal: Pain at the plantar medial tubercle of the right and left calcaneus.  Gait: Unassisted, Nonantalgic.   No images are attached to the encounter.  Radiographs:  Deferred Assessment:   1. Plantar fasciitis, left   2. Plantar fasciitis of right foot   3. Plantar fascial fibromatosis      Plan:  Patient was evaluated and treated and all questions answered.  # Bilateral plantar fasciitis, plantar  fibroma -Discussed with patient treatment options for plantar fibromas and plantar fasciitis -Discussed conservative and surgical treatment options in detail -Will try with steroid Dosepak and meloxicam.  eRx for methylprednisolone 4 mg steroid taper pack take as directed for 6 days.  eRx for meloxicam 7.5 mg take as directed once daily for 30 days.  Discussed the risk and side effects of these medications -If not successful would consider surgical intervention to include wide resection of plantar fibroma with possible plantar fasciotomy as needed          Corinna Gab, DPM [facility]

## 2022-10-09 ENCOUNTER — Ambulatory Visit (HOSPITAL_COMMUNITY)
Admission: RE | Admit: 2022-10-09 | Discharge: 2022-10-09 | Disposition: A | Discharge status: Home / Self Care | Payer: BLUE CROSS/BLUE SHIELD | Source: Ambulatory Visit | Attending: Family Medicine | Admitting: Family Medicine

## 2022-10-09 DIAGNOSIS — M545 Low back pain, unspecified: Secondary | ICD-10-CM | POA: Diagnosis present

## 2022-10-10 NOTE — Progress Notes (Signed)
Called and spoke and discussed results with patient. Patient advised per Dr Brenda King Degenerative changes noted. Patient verbalized understanding.

## 2022-10-11 ENCOUNTER — Other Ambulatory Visit: Payer: Self-pay | Admitting: Podiatry

## 2022-10-15 DIAGNOSIS — M79676 Pain in unspecified toe(s): Secondary | ICD-10-CM

## 2022-10-18 ENCOUNTER — Other Ambulatory Visit: Payer: Self-pay

## 2022-10-18 ENCOUNTER — Ambulatory Visit: Payer: BLUE CROSS/BLUE SHIELD | Attending: Podiatry

## 2022-10-18 ENCOUNTER — Telehealth: Payer: Self-pay

## 2022-10-18 DIAGNOSIS — M79671 Pain in right foot: Secondary | ICD-10-CM | POA: Insufficient documentation

## 2022-10-18 DIAGNOSIS — M79672 Pain in left foot: Secondary | ICD-10-CM | POA: Insufficient documentation

## 2022-10-18 DIAGNOSIS — M722 Plantar fascial fibromatosis: Secondary | ICD-10-CM | POA: Insufficient documentation

## 2022-10-18 DIAGNOSIS — G43009 Migraine without aura, not intractable, without status migrainosus: Secondary | ICD-10-CM

## 2022-10-18 NOTE — Therapy (Signed)
OUTPATIENT PHYSICAL THERAPY LOWER EXTREMITY EVALUATION   Patient Name: Brenda King MRN: [id number] DOB:November 15, 1960, 62 y.o., female Today's Date: 10/18/2022  END OF SESSION:  PT End of Session - 10/18/22 0953     Visit Number 1    Number of Visits 17    Date for PT Re-Evaluation 12/13/22    Authorization Type BCBS    PT Start Time [phone]    PT Stop Time 0918    PT Time Calculation (min) 36 min    Activity Tolerance Patient limited by pain    Behavior During Therapy [facility] for tasks assessed/performed             Past Medical History:  Diagnosis Date   Anxiety    Atrial fibrillation [facility])    Status post ablation 2004 and 2005 - Dr. Sampson Goon [facility])   Constipation    Degenerative disc disease, cervical    Menopause    Nodular infiltrative basal cell carcinoma (BCC) 07/04/2021   Left Shoulder   Past Surgical History:  Procedure Laterality Date   ABDOMINAL HYSTERECTOMY     BREAST ENHANCEMENT SURGERY  2001    CARDIAC ELECTROPHYSIOLOGY MAPPING AND ABLATION     CYSTOSCOPY W/ RETROGRADES Bilateral 04/17/2016   Procedure: CYSTOSCOPY WITH BILATERAL RETROGRADE PYELOGRAMS;  Surgeon: Malen Gauze, MD;  Location: [facility];  Service: Urology;  Laterality: Bilateral;   PARTIAL HYSTERECTOMY  2000    Patient Active Problem List   Diagnosis Date Noted   Midline low back pain without sciatica 10/02/2022   Migraine without aura and without status migrainosus, not intractable 06/03/2017   Chronic idiopathic constipation 02/04/2013   Allergic rhinitis 07/09/2012    PCP: Tommie Sams, DO  REFERRING PROVIDER: Pilar Plate, DPM  REFERRING DIAG: Plantar fasciitis, left [M72.2], Plantar fasciitis of right foot [M72.2]   THERAPY DIAG:  Pain in left foot  Pain in right foot  Rationale for Evaluation and Treatment: Rehabilitation  ONSET DATE: 2 years ago  SUBJECTIVE:   SUBJECTIVE STATEMENT:  Patient reports to PT with bilateral plantar fascia pain related to  plantar fibromas. She did receive injections about 2 months ago with steroidpak and prednisone which had minimal impact. She does report history of Rt plantar fascia procedure and Rt bunionectomy about 2 years ago.  She feels that she is being impacted by working on cement floors for many years. She was unable to complete stem wave therapy d/t price.   Referring provider indicates that plantar fasciotomy may needed. However, patient would like to avoid additional surgery.  She has taken a leave from work on 10/07/22 d/t inability to tolerate standing for 12 hours, "especially on concrete floors"   PERTINENT HISTORY: PMHx includes: anxiety, atrial fibrillation, migraines Patient reporting hx of Rt plantar fascia procedure and Rt bunionectomy procedure in August 2022.   PAIN:  Are you having pain? Yes: NPRS scale: Rt foot 2-9/10, Lt foot 2-4/10 Pain location: bilateral foot pain (medial plantar and heel pain)   Pain description: throbbing  Aggravating factors: weight bearing activities  Relieving factors: not identified at eval   PRECAUTIONS: None  WEIGHT BEARING RESTRICTIONS: No  FALLS:  Has patient fallen in last 6 months? Yes. Number of falls "tripped at work"   LIVING ENVIRONMENT: Lives with: lives with their spouse Lives in: House/apartment Stairs: Yes: External: 2 steps; none Has following equipment at home: None  OCCUPATION: works for [employer]   PLOF: Independent with basic ADLs, Independent with gait, and Vocation/Vocational requirements:  Standing/walking greater than 8 hours  PATIENT GOALS: To decrease pain, improve standing and walking tolerance in order to return to work, conservative management in order to avoid surgical intervention  NEXT MD VISIT: 10/21/22  OBJECTIVE:   DIAGNOSTIC FINDINGS:  07/11/22 BIL Foot x-ray perform; results in Dr. Lenn Sink, DPM office note from same day.   X-rays indicate moderate depression of the arch no other  indications of pathology first MPJ which was fixed several years ago appears stable and looks good   PATIENT SURVEYS:  FOTO 65 current, 71 predicted   COGNITION: Overall cognitive status: Within functional limits for tasks assessed    PALPATION: BIL moderate-to-severe tenderness to palpation along plantar fascia, medial longitudinal arch, medial heel, and medial heel.  She has palpable plantar fibroma with severe tenderness to palpation bilaterally.  Restricted mobility with manual plantar fascial stretching.  LOWER EXTREMITY ROM: to be formally assessed at follow up   Active ROM Right eval Left eval  Hip flexion    Hip extension    Hip abduction    Hip adduction    Hip internal rotation    Hip external rotation    Knee flexion    Knee extension    Ankle dorsiflexion    Ankle plantarflexion    Ankle inversion    Ankle eversion     (Blank rows = not tested)  LOWER EXTREMITY MMT: To be formally assessed at follow-up  MMT Right eval Left eval  Hip flexion    Hip extension    Hip abduction    Hip adduction    Hip internal rotation    Hip external rotation    Knee flexion    Knee extension    Ankle dorsiflexion    Ankle plantarflexion    Ankle inversion    Ankle eversion     (Blank rows = not tested)  GAIT: Distance walked: 70 ft Assistive device utilized: None Level of assistance: Complete Independence Comments: Antalgic gait pattern including short step length bilaterally, forward flexed posture   TODAY'S TREATMENT:                                                                                                                               [facility] Adult PT Treatment:                                                DATE: 10/18/2022  Therapeutic Exercise: Ankle circles, ankle pumps  Therapeutic Activity: Patient education and home exercise program as noted below    PATIENT EDUCATION:  Education details: reviewed initial home exercise program; discussion of  POC, prognosis and goals for skilled PT; encouraged use of cold pack/frozen water bottle for pain management.  Discussed HEP after prolonged sitting and laying down for symptom management upon weightbearing Person educated:  Patient Education method: Explanation, Demonstration, and Handouts Education comprehension: verbalized understanding, returned demonstration, and needs further education  HOME EXERCISE PROGRAM: Verbal instruction for ankle circles, ankle pumps for at least 30 seconds prior to standing after prolonged sitting/laying down.  Use of cold packs/frozen water bottle at end of day, after prolonged weightbearing activity.  ASSESSMENT:  CLINICAL IMPRESSION: Patient is a 62 y.o. female  who was seen today for physical therapy evaluation and treatment for bilateral foot pain related to bilateral plantar fibromas/fasciitis. She is demonstrating decreased plantar mobility altered gait mechanics. She has related pain and difficulty with weightbearing activity, including prolonged standing and walking as necessary for performance of household tasks and occupational duties. She requires skilled PT services at this time to address relevant deficits and improve overall function.     OBJECTIVE IMPAIRMENTS: Abnormal gait, decreased activity tolerance, decreased endurance, decreased mobility, decreased strength, impaired flexibility, and pain.   ACTIVITY LIMITATIONS: carrying, lifting, standing, stairs, transfers, and locomotion level  PARTICIPATION LIMITATIONS: meal prep, cleaning, driving, shopping, community activity, and occupation  PERSONAL FACTORS: Past/current experiences, Profession, Time since onset of injury/illness/exacerbation, and 1-2 comorbidities: PMHx includes: anxiety, atrial fibrillation, migraines  are also affecting patient's functional outcome.   REHAB POTENTIAL: Fair    CLINICAL DECISION MAKING: Evolving/moderate complexity  EVALUATION COMPLEXITY:  Moderate   GOALS: Goals reviewed with patient? Yes  SHORT TERM GOALS: Target date: 11/15/2022   Patient will be independent with initial home program for goal foot mobility, management of plantar fifth symptoms.  Baseline: Initiated at eval  Goal status: INITIAL  2.  Patient will report ability to tolerate standing for 10 to 15 minutes in order to perform normal ADLs/IADLs without exacerbation of symptoms.  Baseline: unable  Goal status: INITIAL    LONG TERM GOALS: Target date: 12/13/2022   Patient will report improved overall functional ability with FOTO score of 70 or greater.  Baseline: 65 Goal status: INITIAL  2.  Patient will report ability to tolerate standing for 30 minutes to an hour without significant exacerbation of symptoms. Baseline: Unable to tolerate Goal status: INITIAL  3.  Patient will report no pain while driving up to 30 minutes in order to perform normal household errands and commute to work. Baseline: Unable to tolerate greater than 5 to 10 minutes Goal status: INITIAL  4.  Patient will demonstrate ability to tolerate uneven surfaces, and incline surfaces without significant exacerbation of pain for at least 10 minutes or greater. Baseline: Unable to tolerate Goal status: INITIAL  5.  Patient will be independent with self-management including modalities as indicated and use of prescribed home program.   Baseline: Initiated at evaluation Goal status: INITIAL    PLAN:  PT FREQUENCY: 2x/week  PT DURATION: 8 weeks  PLANNED INTERVENTIONS: Therapeutic exercises, Therapeutic activity, Neuromuscular re-education, Balance training, Gait training, Patient/Family education, Self Care, Joint mobilization, Stair training, Aquatic Therapy, Dry Needling, Electrical stimulation, Cryotherapy, Moist heat, Taping, Manual therapy, and Re-evaluation  PLAN FOR NEXT SESSION: Complete ankle movement mobility and strength assessment.  Initiate written HEP and review.   Manual therapy as indicated, pain modulation activities.  Patient education related to self-management.   Mauri Reading, PT, DPT 10/18/2022, 1:36 PM

## 2022-10-18 NOTE — Telephone Encounter (Signed)
Pt needs referral to neurology wants to see Naomie Dean MD Neurology [facility] for her migraine   Pt call back [phone]

## 2022-10-21 ENCOUNTER — Encounter: Payer: Self-pay | Admitting: Podiatry

## 2022-10-21 ENCOUNTER — Ambulatory Visit (INDEPENDENT_AMBULATORY_CARE_PROVIDER_SITE_OTHER): Payer: BLUE CROSS/BLUE SHIELD | Admitting: Podiatry

## 2022-10-21 DIAGNOSIS — M722 Plantar fascial fibromatosis: Secondary | ICD-10-CM | POA: Diagnosis not present

## 2022-10-21 MED ORDER — DICLOFENAC SODIUM 75 MG PO TBEC: 75.0000 mg | DELAYED_RELEASE_TABLET | Freq: Two times a day (BID) | ORAL | 2 refills | Status: DC

## 2022-10-21 NOTE — Progress Notes (Signed)
Subjective:   Patient ID: Brenda King, female   DOB: 62 y.o.   MRN: [id number]   HPI Patient presents with continued nodules in the mid arch area bilateral with the left being bigger than the right but both of them being very painful.  Patient states she cannot work her job currently due to the intense discomfort she is experiencing and injections done in the past and offloading have not been successful   ROS      Objective:  Physical Exam  Neurovascular status intact with exquisite discomfort over the mid arch area bilateral with nodular formation left over right measuring 1 cm x 1 cm left and 6 mm x 6 mm right     Assessment:  Appears to be chronic plantar fibromatosis bilateral     Plan:  H&P discussed at great length.  Organ to try conservative physical therapy verapamil cream oral anti-inflammatories ice and continue night splint usage.  Reappoint 4 weeks may require surgery ultimately

## 2022-10-21 NOTE — Telephone Encounter (Signed)
Called patient and message left to call office. Nurse Note: Notify patient that of referral for Neurology placed, if she has not heard anything in 2 weeks. Please notify our office so we can follow up.

## 2022-10-21 NOTE — Telephone Encounter (Signed)
Referral placed for Naomie Dean MD

## 2022-10-21 NOTE — Telephone Encounter (Signed)
Coral Spikes, DO     Please place referral

## 2022-10-22 ENCOUNTER — Telehealth: Payer: Self-pay

## 2022-10-22 NOTE — Telephone Encounter (Signed)
Called and spoke with patient and informed that referral was placed.

## 2022-10-30 ENCOUNTER — Encounter: Payer: Self-pay | Admitting: Physical Therapy

## 2022-10-30 ENCOUNTER — Ambulatory Visit: Payer: BLUE CROSS/BLUE SHIELD | Admitting: Physical Therapy

## 2022-10-30 DIAGNOSIS — M79671 Pain in right foot: Secondary | ICD-10-CM

## 2022-10-30 DIAGNOSIS — M79672 Pain in left foot: Secondary | ICD-10-CM

## 2022-10-30 NOTE — Therapy (Unsigned)
Daily Note   Patient Name: Brenda King MRN: [id number] DOB:1960-06-07, 62 y.o., female Today's Date: 10/31/2022  END OF SESSION:  PT End of Session - 10/30/22 1745     Visit Number 2    Number of Visits 17    Date for PT Re-Evaluation 12/13/22    Authorization Type BCBS    PT Start Time 1745    PT Stop Time 1827    PT Time Calculation (min) 42 min    Activity Tolerance Patient limited by pain    Behavior During Therapy [facility] for tasks assessed/performed             Past Medical History:  Diagnosis Date   Anxiety    Atrial fibrillation (HCC)    Status post ablation 2004 and 2005 - Dr. Sampson Goon [facility])   Constipation    Degenerative disc disease, cervical    Menopause    Nodular infiltrative basal cell carcinoma (BCC) 07/04/2021   Left Shoulder   Past Surgical History:  Procedure Laterality Date   ABDOMINAL HYSTERECTOMY     BREAST ENHANCEMENT SURGERY  2001    CARDIAC ELECTROPHYSIOLOGY MAPPING AND ABLATION     CYSTOSCOPY W/ RETROGRADES Bilateral 04/17/2016   Procedure: CYSTOSCOPY WITH BILATERAL RETROGRADE PYELOGRAMS;  Surgeon: Malen Gauze, MD;  Location: [facility];  Service: Urology;  Laterality: Bilateral;   PARTIAL HYSTERECTOMY  2000    Patient Active Problem List   Diagnosis Date Noted   Midline low back pain without sciatica 10/02/2022   Migraine without aura and without status migrainosus, not intractable 06/03/2017   Chronic idiopathic constipation 02/04/2013   Allergic rhinitis 07/09/2012    PCP: Tommie Sams, DO  REFERRING PROVIDER: Pilar Plate, DPM  REFERRING DIAG: Plantar fasciitis, left [M72.2], Plantar fasciitis of right foot [M72.2]   THERAPY DIAG:  Pain in left foot  Pain in right foot  Rationale for Evaluation and Treatment: Rehabilitation  ONSET DATE: 2 years ago  SUBJECTIVE:   SUBJECTIVE STATEMENT:  Pt reports that she continues to have foot pain, but it is improved since she has not been standing on  concrete.  PERTINENT HISTORY: PMHx includes: anxiety, atrial fibrillation, migraines Patient reporting hx of Rt plantar fascia procedure and Rt bunionectomy procedure in August 2022.   PAIN:  Are you having pain? Yes: NPRS scale: Rt foot 2-9/10, Lt foot 2-4/10 Pain location: bilateral foot pain (medial plantar and heel pain)   Pain description: throbbing  Aggravating factors: weight bearing activities  Relieving factors: not identified at eval   PRECAUTIONS: None  WEIGHT BEARING RESTRICTIONS: No  FALLS:  Has patient fallen in last 6 months? Yes. Number of falls "tripped at work"   LIVING ENVIRONMENT: Lives with: lives with their spouse Lives in: House/apartment Stairs: Yes: External: 2 steps; none Has following equipment at home: None  OCCUPATION: works for [employer]   PLOF: Independent with basic ADLs, Independent with gait, and Vocation/Vocational requirements: Standing/walking greater than 8 hours  PATIENT GOALS: To decrease pain, improve standing and walking tolerance in order to return to work, conservative management in order to avoid surgical intervention  NEXT MD VISIT: 10/21/22  OBJECTIVE:   DIAGNOSTIC FINDINGS:  07/11/22 BIL Foot x-ray perform; results in Dr. Lenn Sink, DPM office note from same day.   X-rays indicate moderate depression of the arch no other indications of pathology first MPJ which was fixed several years ago appears stable and looks good   PATIENT SURVEYS:  FOTO 65 current, 72  predicted   COGNITION: Overall cognitive status: Within functional limits for tasks assessed    PALPATION: BIL moderate-to-severe tenderness to palpation along plantar fascia, medial longitudinal arch, medial heel, and medial heel.  She has palpable plantar fibroma with severe tenderness to palpation bilaterally.  Restricted mobility with manual plantar fascial stretching.  LOWER EXTREMITY ROM: to be formally assessed at follow up   Active ROM  Right eval Left eval  Hip flexion    Hip extension    Hip abduction    Hip adduction    Hip internal rotation    Hip external rotation    Knee flexion    Knee extension    Ankle dorsiflexion    Ankle plantarflexion    Ankle inversion    Ankle eversion     (Blank rows = not tested)  LOWER EXTREMITY MMT: To be formally assessed at follow-up  MMT Right eval Left eval  Hip flexion    Hip extension    Hip abduction    Hip adduction    Hip internal rotation    Hip external rotation    Knee flexion    Knee extension    Ankle dorsiflexion    Ankle plantarflexion    Ankle inversion    Ankle eversion     (Blank rows = not tested)  GAIT: Distance walked: 70 ft Assistive device utilized: None Level of assistance: Complete Independence Comments: Antalgic gait pattern including short step length bilaterally, forward flexed posture   TODAY'S TREATMENT:                                                                                                                               [facility] Adult PT Treatment:                                                DATE: 10/18/2022  Therapeutic Exercise: nu-step L5 76m while taking subjective and planning session with patient Towel scruches Marble pick up  BAPS board L4    PATIENT EDUCATION:  Education details: reviewed initial home exercise program; discussion of POC, prognosis and goals for skilled PT; encouraged use of cold pack/frozen water bottle for pain management.  Discussed HEP after prolonged sitting and laying down for symptom management upon weightbearing Person educated: Patient Education method: Explanation, Demonstration, and Handouts Education comprehension: verbalized understanding, returned demonstration, and needs further education  HOME EXERCISE PROGRAM: Verbal instruction for ankle circles, ankle pumps for at least 30 seconds prior to standing after prolonged sitting/laying down.  Use of cold packs/frozen water bottle at  end of day, after prolonged weightbearing activity.  ASSESSMENT:  CLINICAL IMPRESSION: Pt presents with continued foot pain which is somewhat improved after not working for a few weeks.  She continues to be limited in standing exercises d/t pain.  We concentrated  on on foot intrinsic strengthening as well as ankle mobility.  May attempt PF stretching next visit.  OBJECTIVE IMPAIRMENTS: Abnormal gait, decreased activity tolerance, decreased endurance, decreased mobility, decreased strength, impaired flexibility, and pain.   ACTIVITY LIMITATIONS: carrying, lifting, standing, stairs, transfers, and locomotion level  PARTICIPATION LIMITATIONS: meal prep, cleaning, driving, shopping, community activity, and occupation  PERSONAL FACTORS: Past/current experiences, Profession, Time since onset of injury/illness/exacerbation, and 1-2 comorbidities: PMHx includes: anxiety, atrial fibrillation, migraines  are also affecting patient's functional outcome.   REHAB POTENTIAL: Fair    CLINICAL DECISION MAKING: Evolving/moderate complexity  EVALUATION COMPLEXITY: Moderate   GOALS: Goals reviewed with patient? Yes  SHORT TERM GOALS: Target date: 11/15/2022   Patient will be independent with initial home program for goal foot mobility, management of plantar fifth symptoms.  Baseline: Initiated at eval  Goal status: INITIAL  2.  Patient will report ability to tolerate standing for 10 to 15 minutes in order to perform normal ADLs/IADLs without exacerbation of symptoms.  Baseline: unable  Goal status: INITIAL    LONG TERM GOALS: Target date: 12/13/2022   Patient will report improved overall functional ability with FOTO score of 70 or greater.  Baseline: 65 Goal status: INITIAL  2.  Patient will report ability to tolerate standing for 30 minutes to an hour without significant exacerbation of symptoms. Baseline: Unable to tolerate Goal status: INITIAL  3.  Patient will report no pain while  driving up to 30 minutes in order to perform normal household errands and commute to work. Baseline: Unable to tolerate greater than 5 to 10 minutes Goal status: INITIAL  4.  Patient will demonstrate ability to tolerate uneven surfaces, and incline surfaces without significant exacerbation of pain for at least 10 minutes or greater. Baseline: Unable to tolerate Goal status: INITIAL  5.  Patient will be independent with self-management including modalities as indicated and use of prescribed home program.   Baseline: Initiated at evaluation Goal status: INITIAL    PLAN:  PT FREQUENCY: 2x/week  PT DURATION: 8 weeks  PLANNED INTERVENTIONS: Therapeutic exercises, Therapeutic activity, Neuromuscular re-education, Balance training, Gait training, Patient/Family education, Self Care, Joint mobilization, Stair training, Aquatic Therapy, Dry Needling, Electrical stimulation, Cryotherapy, Moist heat, Taping, Manual therapy, and Re-evaluation  PLAN FOR NEXT SESSION: Complete ankle movement mobility and strength assessment.  Initiate written HEP and review.  Manual therapy as indicated, pain modulation activities.  Patient education related to self-management.   Fredderick Phenix, PT, DPT 10/31/2022, 7:00 AM

## 2022-11-01 ENCOUNTER — Ambulatory Visit: Payer: BLUE CROSS/BLUE SHIELD | Admitting: Physical Therapy

## 2022-11-01 ENCOUNTER — Encounter: Payer: Self-pay | Admitting: Physical Therapy

## 2022-11-01 DIAGNOSIS — M79671 Pain in right foot: Secondary | ICD-10-CM

## 2022-11-01 DIAGNOSIS — M79672 Pain in left foot: Secondary | ICD-10-CM | POA: Diagnosis not present

## 2022-11-01 NOTE — Therapy (Signed)
Daily Note   Patient Name: Brenda King MRN: [id number] DOB:05-29-1960, 62 y.o., female Today's Date: 11/01/2022  END OF SESSION:  PT End of Session - 11/01/22 1002     Visit Number 3    Number of Visits 17    Date for PT Re-Evaluation 12/13/22    Authorization Type BCBS    PT Start Time 1002    PT Stop Time 1042    PT Time Calculation (min) 40 min    Activity Tolerance Patient limited by pain    Behavior During Therapy [facility] for tasks assessed/performed             Past Medical History:  Diagnosis Date   Anxiety    Atrial fibrillation (HCC)    Status post ablation 2004 and 2005 - Dr. Sampson Goon [facility])   Constipation    Degenerative disc disease, cervical    Menopause    Nodular infiltrative basal cell carcinoma (BCC) 07/04/2021   Left Shoulder   Past Surgical History:  Procedure Laterality Date   ABDOMINAL HYSTERECTOMY     BREAST ENHANCEMENT SURGERY  2001    CARDIAC ELECTROPHYSIOLOGY MAPPING AND ABLATION     CYSTOSCOPY W/ RETROGRADES Bilateral 04/17/2016   Procedure: CYSTOSCOPY WITH BILATERAL RETROGRADE PYELOGRAMS;  Surgeon: Malen Gauze, MD;  Location: [facility];  Service: Urology;  Laterality: Bilateral;   PARTIAL HYSTERECTOMY  2000    Patient Active Problem List   Diagnosis Date Noted   Midline low back pain without sciatica 10/02/2022   Migraine without aura and without status migrainosus, not intractable 06/03/2017   Chronic idiopathic constipation 02/04/2013   Allergic rhinitis 07/09/2012    PCP: Tommie Sams, DO  REFERRING PROVIDER: Pilar Plate, DPM  REFERRING DIAG: Plantar fasciitis, left [M72.2], Plantar fasciitis of right foot [M72.2]   THERAPY DIAG:  Pain in left foot  Pain in right foot  Rationale for Evaluation and Treatment: Rehabilitation  ONSET DATE: 2 years ago  SUBJECTIVE:   SUBJECTIVE STATEMENT:  Pt reports that her feet are still sore.  She feels that last visit went well and did not increase is pain  significantly.  PERTINENT HISTORY: PMHx includes: anxiety, atrial fibrillation, migraines Patient reporting hx of Rt plantar fascia procedure and Rt bunionectomy procedure in August 2022.   PAIN:  Are you having pain? Yes: NPRS scale: Rt foot 2-9/10, Lt foot 2-4/10 Pain location: bilateral foot pain (medial plantar and heel pain)   Pain description: throbbing  Aggravating factors: weight bearing activities  Relieving factors: not identified at eval   PRECAUTIONS: None  WEIGHT BEARING RESTRICTIONS: No  FALLS:  Has patient fallen in last 6 months? Yes. Number of falls "tripped at work"   LIVING ENVIRONMENT: Lives with: lives with their spouse Lives in: House/apartment Stairs: Yes: External: 2 steps; none Has following equipment at home: None  OCCUPATION: works for [employer]   PLOF: Independent with basic ADLs, Independent with gait, and Vocation/Vocational requirements: Standing/walking greater than 8 hours  PATIENT GOALS: To decrease pain, improve standing and walking tolerance in order to return to work, conservative management in order to avoid surgical intervention  NEXT MD VISIT: 10/21/22  OBJECTIVE:   DIAGNOSTIC FINDINGS:  07/11/22 BIL Foot x-ray perform; results in Dr. Lenn Sink, DPM office note from same day.   X-rays indicate moderate depression of the arch no other indications of pathology first MPJ which was fixed several years ago appears stable and looks good   PATIENT SURVEYS:  FOTO 25  current, 71 predicted   COGNITION: Overall cognitive status: Within functional limits for tasks assessed    PALPATION: BIL moderate-to-severe tenderness to palpation along plantar fascia, medial longitudinal arch, medial heel, and medial heel.  She has palpable plantar fibroma with severe tenderness to palpation bilaterally.  Restricted mobility with manual plantar fascial stretching.  LOWER EXTREMITY ROM: to be formally assessed at follow up    Active ROM Right eval Left eval  Hip flexion    Hip extension    Hip abduction    Hip adduction    Hip internal rotation    Hip external rotation    Knee flexion    Knee extension    Ankle dorsiflexion    Ankle plantarflexion    Ankle inversion    Ankle eversion     (Blank rows = not tested)  LOWER EXTREMITY MMT: To be formally assessed at follow-up  MMT Right eval Left eval  Hip flexion    Hip extension    Hip abduction    Hip adduction    Hip internal rotation    Hip external rotation    Knee flexion    Knee extension    Ankle dorsiflexion    Ankle plantarflexion    Ankle inversion    Ankle eversion     (Blank rows = not tested)  GAIT: Distance walked: 70 ft Assistive device utilized: None Level of assistance: Complete Independence Comments: Antalgic gait pattern including short step length bilaterally, forward flexed posture   TODAY'S TREATMENT:                                                                                                                               [facility] Adult PT Treatment:                                                DATE: 10/18/2022  Therapeutic Exercise: nu-step L5 25m while taking subjective and planning session with patient Towel scruches Marble pick up   Neuromuscular re-ed: Tandem on foam    PATIENT EDUCATION:  Education details: reviewed initial home exercise program; discussion of POC, prognosis and goals for skilled PT; encouraged use of cold pack/frozen water bottle for pain management.  Discussed HEP after prolonged sitting and laying down for symptom management upon weightbearing Person educated: Patient Education method: Explanation, Demonstration, and Handouts Education comprehension: verbalized understanding, returned demonstration, and needs further education  HOME EXERCISE PROGRAM: Access Code: [email or web address] URL: [email or web address] Date: 11/01/2022 Prepared by: Alphonzo Severance  Exercises -  Heel Raises with Counter Support  - 1 x daily - 7 x weekly - 3 sets - 10 reps - Long Sitting Plantar Fascia Stretch with Towel  - 3 x daily - 7 x weekly - 1 sets - 2 reps - 2 minutes hold  ASSESSMENT:  CLINICAL  IMPRESSION: Pt presents to clinic with continued foot pain.  She reports HEP compliance.  We concentrated on foot intrinsic strengthening today in combination with PF stretching.  Pt toleratd well with fatigue but no significant increase in sxs.  HEP updated.  OBJECTIVE IMPAIRMENTS: Abnormal gait, decreased activity tolerance, decreased endurance, decreased mobility, decreased strength, impaired flexibility, and pain.   ACTIVITY LIMITATIONS: carrying, lifting, standing, stairs, transfers, and locomotion level  PARTICIPATION LIMITATIONS: meal prep, cleaning, driving, shopping, community activity, and occupation  PERSONAL FACTORS: Past/current experiences, Profession, Time since onset of injury/illness/exacerbation, and 1-2 comorbidities: PMHx includes: anxiety, atrial fibrillation, migraines  are also affecting patient's functional outcome.   REHAB POTENTIAL: Fair    CLINICAL DECISION MAKING: Evolving/moderate complexity  EVALUATION COMPLEXITY: Moderate   GOALS: Goals reviewed with patient? Yes  SHORT TERM GOALS: Target date: 11/15/2022   Patient will be independent with initial home program for goal foot mobility, management of plantar fifth symptoms.  Baseline: Initiated at eval  Goal status: INITIAL  2.  Patient will report ability to tolerate standing for 10 to 15 minutes in order to perform normal ADLs/IADLs without exacerbation of symptoms.  Baseline: unable  Goal status: INITIAL    LONG TERM GOALS: Target date: 12/13/2022   Patient will report improved overall functional ability with FOTO score of 70 or greater.  Baseline: 65 Goal status: INITIAL  2.  Patient will report ability to tolerate standing for 30 minutes to an hour without significant exacerbation of  symptoms. Baseline: Unable to tolerate Goal status: INITIAL  3.  Patient will report no pain while driving up to 30 minutes in order to perform normal household errands and commute to work. Baseline: Unable to tolerate greater than 5 to 10 minutes Goal status: INITIAL  4.  Patient will demonstrate ability to tolerate uneven surfaces, and incline surfaces without significant exacerbation of pain for at least 10 minutes or greater. Baseline: Unable to tolerate Goal status: INITIAL  5.  Patient will be independent with self-management including modalities as indicated and use of prescribed home program.   Baseline: Initiated at evaluation Goal status: INITIAL    PLAN:  PT FREQUENCY: 2x/week  PT DURATION: 8 weeks  PLANNED INTERVENTIONS: Therapeutic exercises, Therapeutic activity, Neuromuscular re-education, Balance training, Gait training, Patient/Family education, Self Care, Joint mobilization, Stair training, Aquatic Therapy, Dry Needling, Electrical stimulation, Cryotherapy, Moist heat, Taping, Manual therapy, and Re-evaluation  PLAN FOR NEXT SESSION: Complete ankle movement mobility and strength assessment.  Initiate written HEP and review.  Manual therapy as indicated, pain modulation activities.  Patient education related to self-management.   Fredderick Phenix, PT, DPT 11/01/2022, 11:50 AM

## 2022-11-03 ENCOUNTER — Other Ambulatory Visit: Payer: Self-pay | Admitting: Podiatry

## 2022-11-06 ENCOUNTER — Ambulatory Visit: Payer: BLUE CROSS/BLUE SHIELD | Admitting: Physical Therapy

## 2022-11-06 ENCOUNTER — Encounter: Payer: Self-pay | Admitting: Physical Therapy

## 2022-11-06 DIAGNOSIS — M79672 Pain in left foot: Secondary | ICD-10-CM | POA: Diagnosis not present

## 2022-11-06 DIAGNOSIS — M79671 Pain in right foot: Secondary | ICD-10-CM

## 2022-11-06 NOTE — Therapy (Signed)
Daily Note   Patient Name: Brenda King MRN: [id number] DOB:[birth year], 62 y.o., female Today's Date: 11/06/2022  END OF SESSION:  PT End of Session - 11/06/22 1702     Visit Number 4    Number of Visits 17    Date for PT Re-Evaluation 12/13/22    Authorization Type BCBS    PT Start Time 1701    PT Stop Time 1742    PT Time Calculation (min) 41 min    Activity Tolerance Patient limited by pain    Behavior During Therapy [facility] for tasks assessed/performed             Past Medical History:  Diagnosis Date   Anxiety    Atrial fibrillation (HCC)    Status post ablation 2004 and 2005 - Dr. Sampson Goon [facility])   Constipation    Degenerative disc disease, cervical    Menopause    Nodular infiltrative basal cell carcinoma (BCC) 07/04/2021   Left Shoulder   Past Surgical History:  Procedure Laterality Date   ABDOMINAL HYSTERECTOMY     BREAST ENHANCEMENT SURGERY  2001    CARDIAC ELECTROPHYSIOLOGY MAPPING AND ABLATION     CYSTOSCOPY W/ RETROGRADES Bilateral 04/17/2016   Procedure: CYSTOSCOPY WITH BILATERAL RETROGRADE PYELOGRAMS;  Surgeon: Malen Gauze, MD;  Location: [facility];  Service: Urology;  Laterality: Bilateral;   PARTIAL HYSTERECTOMY  2000    Patient Active Problem List   Diagnosis Date Noted   Midline low back pain without sciatica 10/02/2022   Migraine without aura and without status migrainosus, not intractable 06/03/2017   Chronic idiopathic constipation 02/04/2013   Allergic rhinitis 07/09/2012    PCP: Tommie Sams, DO  REFERRING PROVIDER: Pilar Plate, DPM  REFERRING DIAG: Plantar fasciitis, left [M72.2], Plantar fasciitis of right foot [M72.2]   THERAPY DIAG:  Pain in left foot  Pain in right foot  Rationale for Evaluation and Treatment: Rehabilitation  ONSET DATE: 2 years ago  SUBJECTIVE:   SUBJECTIVE STATEMENT:  Pt reports that her foot pain is improving.  She has been HEP compliant.  She continues to have R heel pain,  particularly in the morning.  PERTINENT HISTORY: PMHx includes: anxiety, atrial fibrillation, migraines Patient reporting hx of Rt plantar fascia procedure and Rt bunionectomy procedure in August 2022.   PAIN:  Are you having pain? Yes: NPRS scale: Rt foot 2-9/10, Lt foot 2-4/10 Pain location: bilateral foot pain (medial plantar and heel pain)   Pain description: throbbing  Aggravating factors: weight bearing activities  Relieving factors: not identified at eval   PRECAUTIONS: None  WEIGHT BEARING RESTRICTIONS: No  FALLS:  Has patient fallen in last 6 months? Yes. Number of falls "tripped at work"   LIVING ENVIRONMENT: Lives with: lives with their spouse Lives in: House/apartment Stairs: Yes: External: 2 steps; none Has following equipment at home: None  OCCUPATION: works for [employer]   PLOF: Independent with basic ADLs, Independent with gait, and Vocation/Vocational requirements: Standing/walking greater than 8 hours  PATIENT GOALS: To decrease pain, improve standing and walking tolerance in order to return to work, conservative management in order to avoid surgical intervention  NEXT MD VISIT: 10/21/22  OBJECTIVE:   DIAGNOSTIC FINDINGS:  07/11/22 BIL Foot x-ray perform; results in Dr. Lenn Sink, DPM office note from same day.   X-rays indicate moderate depression of the arch no other indications of pathology first MPJ which was fixed several years ago appears stable and looks good   PATIENT SURVEYS:  FOTO 65 current, 71 predicted   COGNITION: Overall cognitive status: Within functional limits for tasks assessed    PALPATION: BIL moderate-to-severe tenderness to palpation along plantar fascia, medial longitudinal arch, medial heel, and medial heel.  She has palpable plantar fibroma with severe tenderness to palpation bilaterally.  Restricted mobility with manual plantar fascial stretching.  LOWER EXTREMITY ROM: to be formally assessed at  follow up   Active ROM Right eval Left eval  Hip flexion    Hip extension    Hip abduction    Hip adduction    Hip internal rotation    Hip external rotation    Knee flexion    Knee extension    Ankle dorsiflexion    Ankle plantarflexion    Ankle inversion    Ankle eversion     (Blank rows = not tested)  LOWER EXTREMITY MMT: To be formally assessed at follow-up  MMT Right eval Left eval  Hip flexion    Hip extension    Hip abduction    Hip adduction    Hip internal rotation    Hip external rotation    Knee flexion    Knee extension    Ankle dorsiflexion    Ankle plantarflexion    Ankle inversion    Ankle eversion     (Blank rows = not tested)  GAIT: Distance walked: 70 ft Assistive device utilized: None Level of assistance: Complete Independence Comments: Antalgic gait pattern including short step length bilaterally, forward flexed posture   TODAY'S TREATMENT:                                                                                                                               [facility] Adult PT Treatment:                                                DATE: 11/06/2022  Therapeutic Exercise: Bike 5 min for warm up Towel scruches - 4'' Marble pick up  Slant board stretch  Heel raises 3x10  Neuromuscular re-ed: 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  Discussed HEP after prolonged sitting and laying down for symptom management upon weightbearing Person educated: Patient Education method: Explanation, Demonstration, and Handouts Education comprehension: verbalized understanding, returned demonstration, and needs further education  HOME EXERCISE PROGRAM: Access Code: [email or web address] URL: [email or web address] Date: 11/06/2022 Prepared  by: Alphonzo Severance  Exercises - Heel Raises with Counter Support  - 1 x daily - 7 x weekly - 3 sets - 10 reps - Long Sitting Plantar Fascia Stretch with Towel  - 3 x daily - 7 x weekly - 1  sets - 2 reps - 2 minutes hold - Seated Toe Towel Scrunches  - 1 x daily - 7 x weekly - 3 sets - 10 reps - Seated Marble Pick-Up with Toes  - 1 x daily - 7 x weekly - 3 sets - 10 reps  ASSESSMENT:  CLINICAL IMPRESSION: Pt progressing well with reduced pain and improved tolerance to walking at home.  Added load with heel raises today which was tolerated well.  HEP updated.  OBJECTIVE IMPAIRMENTS: Abnormal gait, decreased activity tolerance, decreased endurance, decreased mobility, decreased strength, impaired flexibility, and pain.   ACTIVITY LIMITATIONS: carrying, lifting, standing, stairs, transfers, and locomotion level  PARTICIPATION LIMITATIONS: meal prep, cleaning, driving, shopping, community activity, and occupation  PERSONAL FACTORS: Past/current experiences, Profession, Time since onset of injury/illness/exacerbation, and 1-2 comorbidities: PMHx includes: anxiety, atrial fibrillation, migraines  are also affecting patient's functional outcome.   REHAB POTENTIAL: Fair    CLINICAL DECISION MAKING: Evolving/moderate complexity  EVALUATION COMPLEXITY: Moderate   GOALS: Goals reviewed with patient? Yes  SHORT TERM GOALS: Target date: 11/15/2022   Patient will be independent with initial home program for goal foot mobility, management of plantar fifth symptoms.  Baseline: Initiated at eval  Goal status: INITIAL  2.  Patient will report ability to tolerate standing for 10 to 15 minutes in order to perform normal ADLs/IADLs without exacerbation of symptoms.  Baseline: unable  Goal status: INITIAL    LONG TERM GOALS: Target date: 12/13/2022   Patient will report improved overall functional ability with FOTO score of 70 or greater.  Baseline: 65 Goal status: INITIAL  2.  Patient  will report ability to tolerate standing for 30 minutes to an hour without significant exacerbation of symptoms. Baseline: Unable to tolerate Goal status: INITIAL  3.  Patient will report no pain while driving up to 30 minutes in order to perform normal household errands and commute to work. Baseline: Unable to tolerate greater than 5 to 10 minutes Goal status: INITIAL  4.  Patient will demonstrate ability to tolerate uneven surfaces, and incline surfaces without significant exacerbation of pain for at least 10 minutes or greater. Baseline: Unable to tolerate Goal status: INITIAL  5.  Patient will be independent with self-management including modalities as indicated and use of prescribed home program.   Baseline: Initiated at evaluation Goal status: INITIAL    PLAN:  PT FREQUENCY: 2x/week  PT DURATION: 8 weeks  PLANNED INTERVENTIONS: Therapeutic exercises, Therapeutic activity, Neuromuscular re-education, Balance training, Gait training, Patient/Family education, Self Care, Joint mobilization, Stair training, Aquatic Therapy, Dry Needling, Electrical stimulation, Cryotherapy, Moist heat, Taping, Manual therapy, and Re-evaluation  PLAN FOR NEXT SESSION: Complete ankle movement mobility and strength assessment.  Initiate written HEP and review.  Manual therapy as indicated, pain modulation activities.  Patient education related to self-management.   Fredderick Phenix, PT, DPT 11/06/2022, 5:43 PM

## 2022-11-08 ENCOUNTER — Ambulatory Visit: Payer: BLUE CROSS/BLUE SHIELD | Admitting: Physical Therapy

## 2022-11-08 DIAGNOSIS — M79672 Pain in left foot: Secondary | ICD-10-CM | POA: Diagnosis not present

## 2022-11-08 DIAGNOSIS — M79671 Pain in right foot: Secondary | ICD-10-CM

## 2022-11-08 NOTE — Therapy (Signed)
Daily Note   Patient Name: Brenda King MRN: [id number] DOB:May 05, 1960, 62 y.o., female Today's Date: 11/08/2022  END OF SESSION:  PT End of Session - 11/08/22 1002     Visit Number 5    Number of Visits 17    Date for PT Re-Evaluation 12/13/22    Authorization Type BCBS    PT Start Time 1000    PT Stop Time 1041    PT Time Calculation (min) 41 min    Activity Tolerance Patient limited by pain    Behavior During Therapy [facility] for tasks assessed/performed             Past Medical History:  Diagnosis Date   Anxiety    Atrial fibrillation (HCC)    Status post ablation 2004 and 2005 - Dr. Sampson Goon [facility])   Constipation    Degenerative disc disease, cervical    Menopause    Nodular infiltrative basal cell carcinoma (BCC) 07/04/2021   Left Shoulder   Past Surgical History:  Procedure Laterality Date   ABDOMINAL HYSTERECTOMY     BREAST ENHANCEMENT SURGERY  2001    CARDIAC ELECTROPHYSIOLOGY MAPPING AND ABLATION     CYSTOSCOPY W/ RETROGRADES Bilateral 04/17/2016   Procedure: CYSTOSCOPY WITH BILATERAL RETROGRADE PYELOGRAMS;  Surgeon: Malen Gauze, MD;  Location: [facility];  Service: Urology;  Laterality: Bilateral;   PARTIAL HYSTERECTOMY  2000    Patient Active Problem List   Diagnosis Date Noted   Midline low back pain without sciatica 10/02/2022   Migraine without aura and without status migrainosus, not intractable 06/03/2017   Chronic idiopathic constipation 02/04/2013   Allergic rhinitis 07/09/2012    PCP: Tommie Sams, DO  REFERRING PROVIDER: Pilar Plate, DPM  REFERRING DIAG: Plantar fasciitis, left [M72.2], Plantar fasciitis of right foot [M72.2]   THERAPY DIAG:  Pain in left foot  Pain in right foot  Rationale for Evaluation and Treatment: Rehabilitation  ONSET DATE: 2 years ago  SUBJECTIVE:   SUBJECTIVE STATEMENT Pt reports that she has had a stressful week taking care of her mother.  She felt ok after last visit.    PERTINENT  HISTORY: PMHx includes: anxiety, atrial fibrillation, migraines Patient reporting hx of Rt plantar fascia procedure and Rt bunionectomy procedure in August 2022.   PAIN:  Are you having pain? Yes: NPRS scale: Rt foot 2-9/10, Lt foot 2-4/10 Pain location: bilateral foot pain (medial plantar and heel pain)   Pain description: throbbing  Aggravating factors: weight bearing activities  Relieving factors: not identified at eval   PRECAUTIONS: None  WEIGHT BEARING RESTRICTIONS: No  FALLS:  Has patient fallen in last 6 months? Yes. Number of falls "tripped at work"   LIVING ENVIRONMENT: Lives with: lives with their spouse Lives in: House/apartment Stairs: Yes: External: 2 steps; none Has following equipment at home: None  OCCUPATION: works for [employer]   PLOF: Independent with basic ADLs, Independent with gait, and Vocation/Vocational requirements: Standing/walking greater than 8 hours  PATIENT GOALS: To decrease pain, improve standing and walking tolerance in order to return to work, conservative management in order to avoid surgical intervention  NEXT MD VISIT: 10/21/22  OBJECTIVE:   DIAGNOSTIC FINDINGS:  07/11/22 BIL Foot x-ray perform; results in Dr. Lenn Sink, DPM office note from same day.   X-rays indicate moderate depression of the arch no other indications of pathology first MPJ which was fixed several years ago appears stable and looks good   PATIENT SURVEYS:  FOTO 65 current,  71 predicted   COGNITION: Overall cognitive status: Within functional limits for tasks assessed    PALPATION: BIL moderate-to-severe tenderness to palpation along plantar fascia, medial longitudinal arch, medial heel, and medial heel.  She has palpable plantar fibroma with severe tenderness to palpation bilaterally.  Restricted mobility with manual plantar fascial stretching.  LOWER EXTREMITY ROM: to be formally assessed at follow up   Active ROM Right eval Left eval   Hip flexion    Hip extension    Hip abduction    Hip adduction    Hip internal rotation    Hip external rotation    Knee flexion    Knee extension    Ankle dorsiflexion    Ankle plantarflexion    Ankle inversion    Ankle eversion     (Blank rows = not tested)  LOWER EXTREMITY MMT: To be formally assessed at follow-up  MMT Right eval Left eval  Hip flexion    Hip extension    Hip abduction    Hip adduction    Hip internal rotation    Hip external rotation    Knee flexion    Knee extension    Ankle dorsiflexion    Ankle plantarflexion    Ankle inversion    Ankle eversion     (Blank rows = not tested)  GAIT: Distance walked: 70 ft Assistive device utilized: None Level of assistance: Complete Independence Comments: Antalgic gait pattern including short step length bilaterally, forward flexed posture   TODAY'S TREATMENT:                                                                                                                               [facility] Adult PT Treatment:  Therapeutic Exercise: Bike 5 min for warm up Towel scruches - 4# Marble pick up  Slant board stretch  Heel raises with heel hang on foam 3x10 PF stretch with sheet  Neuromuscular re-ed: 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  Discussed HEP after prolonged sitting and laying down for symptom management upon weightbearing Person educated: Patient Education method: Explanation, Demonstration, and Handouts Education comprehension: verbalized understanding, returned demonstration, and needs further education  HOME EXERCISE PROGRAM: Access Code: [email or web address] URL: [email or web address] Date: 11/06/2022 Prepared by: Alphonzo Severance  Exercises - Heel Raises with Counter Support   - 1 x daily - 7 x weekly - 3 sets - 10 reps - Long Sitting Plantar Fascia Stretch with Towel  - 3 x daily - 7 x weekly - 1 sets - 2 reps - 2 minutes hold - Seated Toe Towel Scrunches  - 1 x daily - 7 x weekly - 3 sets - 10 reps - Seated Marble Pick-Up with Toes  - 1 x daily - 7 x weekly -  3 sets - 10 reps  ASSESSMENT:  CLINICAL IMPRESSION: Pt with stressful week and slightly increased baseline pain today.  She was able to progress several exercises including heel raise on unstable surface and SLS on unstable surface.  Overall good progress toward goals.   OBJECTIVE IMPAIRMENTS: Abnormal gait, decreased activity tolerance, decreased endurance, decreased mobility, decreased strength, impaired flexibility, and pain.   ACTIVITY LIMITATIONS: carrying, lifting, standing, stairs, transfers, and locomotion level  PARTICIPATION LIMITATIONS: meal prep, cleaning, driving, shopping, community activity, and occupation  PERSONAL FACTORS: Past/current experiences, Profession, Time since onset of injury/illness/exacerbation, and 1-2 comorbidities: PMHx includes: anxiety, atrial fibrillation, migraines  are also affecting patient's functional outcome.   REHAB POTENTIAL: Fair    CLINICAL DECISION MAKING: Evolving/moderate complexity  EVALUATION COMPLEXITY: Moderate   GOALS: Goals reviewed with patient? Yes  SHORT TERM GOALS: Target date: 11/15/2022   Patient will be independent with initial home program for goal foot mobility, management of plantar fifth symptoms.  Baseline: Initiated at eval  Goal status: INITIAL  2.  Patient will report ability to tolerate standing for 10 to 15 minutes in order to perform normal ADLs/IADLs without exacerbation of symptoms.  Baseline: unable  Goal status: INITIAL    LONG TERM GOALS: Target date: 12/13/2022   Patient will report improved overall functional ability with FOTO score of 70 or greater.  Baseline: 65 Goal status: INITIAL  2.  Patient will report  ability to tolerate standing for 30 minutes to an hour without significant exacerbation of symptoms. Baseline: Unable to tolerate Goal status: INITIAL  3.  Patient will report no pain while driving up to 30 minutes in order to perform normal household errands and commute to work. Baseline: Unable to tolerate greater than 5 to 10 minutes Goal status: INITIAL  4.  Patient will demonstrate ability to tolerate uneven surfaces, and incline surfaces without significant exacerbation of pain for at least 10 minutes or greater. Baseline: Unable to tolerate Goal status: INITIAL  5.  Patient will be independent with self-management including modalities as indicated and use of prescribed home program.   Baseline: Initiated at evaluation Goal status: INITIAL    PLAN:  PT FREQUENCY: 2x/week  PT DURATION: 8 weeks  PLANNED INTERVENTIONS: Therapeutic exercises, Therapeutic activity, Neuromuscular re-education, Balance training, Gait training, Patient/Family education, Self Care, Joint mobilization, Stair training, Aquatic Therapy, Dry Needling, Electrical stimulation, Cryotherapy, Moist heat, Taping, Manual therapy, and Re-evaluation  PLAN FOR NEXT SESSION: Complete ankle movement mobility and strength assessment.  Initiate written HEP and review.  Manual therapy as indicated, pain modulation activities.  Patient education related to self-management.   Fredderick Phenix, PT, DPT 11/08/2022, 10:47 AM

## 2022-11-13 ENCOUNTER — Ambulatory Visit: Payer: BLUE CROSS/BLUE SHIELD

## 2022-11-13 DIAGNOSIS — M79672 Pain in left foot: Secondary | ICD-10-CM

## 2022-11-13 DIAGNOSIS — M79671 Pain in right foot: Secondary | ICD-10-CM

## 2022-11-13 NOTE — Therapy (Signed)
Daily Note   Patient Name: Brenda King MRN: [id number] DOB:[birth year], 62 y.o., female Today's Date: 11/13/2022  END OF SESSION:  PT End of Session - 11/13/22 1746     Visit Number 6    Number of Visits 17    Date for PT Re-Evaluation 12/13/22    Authorization Type BCBS    PT Start Time 1747    PT Stop Time 1827    PT Time Calculation (min) 40 min    Activity Tolerance Patient tolerated treatment well    Behavior During Therapy [facility] for tasks assessed/performed              Past Medical History:  Diagnosis Date   Anxiety    Atrial fibrillation (HCC)    Status post ablation 2004 and 2005 - Dr. Sampson Goon [facility])   Constipation    Degenerative disc disease, cervical    Menopause    Nodular infiltrative basal cell carcinoma (BCC) 07/04/2021   Left Shoulder   Past Surgical History:  Procedure Laterality Date   ABDOMINAL HYSTERECTOMY     BREAST ENHANCEMENT SURGERY  2001    CARDIAC ELECTROPHYSIOLOGY MAPPING AND ABLATION     CYSTOSCOPY W/ RETROGRADES Bilateral 04/17/2016   Procedure: CYSTOSCOPY WITH BILATERAL RETROGRADE PYELOGRAMS;  Surgeon: Malen Gauze, MD;  Location: [facility];  Service: Urology;  Laterality: Bilateral;   PARTIAL HYSTERECTOMY  2000    Patient Active Problem List   Diagnosis Date Noted   Midline low back pain without sciatica 10/02/2022   Migraine without aura and without status migrainosus, not intractable 06/03/2017   Chronic idiopathic constipation 02/04/2013   Allergic rhinitis 07/09/2012    PCP: Tommie Sams, DO  REFERRING PROVIDER: Pilar Plate, DPM  REFERRING DIAG: Plantar fasciitis, left [M72.2], Plantar fasciitis of right foot [M72.2]   THERAPY DIAG:  Pain in left foot  Pain in right foot  Rationale for Evaluation and Treatment: Rehabilitation  ONSET DATE: 2 years ago  SUBJECTIVE:   SUBJECTIVE STATEMENT Patient states that PT has been doing better with her pain and feels that it has been noticing her fibromas  shrinking. She has improvement in symptoms using "boot" with heat packs that they gave him.    PERTINENT HISTORY: PMHx includes: anxiety, atrial fibrillation, migraines Patient reporting hx of Rt plantar fascia procedure and Rt bunionectomy procedure in August 2022.   PAIN:  Are you having pain? Yes: NPRS scale: Rt foot 2-9/10, Lt foot 2-4/10 Pain location: bilateral foot pain (medial plantar and heel pain)   Pain description: throbbing  Aggravating factors: weight bearing activities  Relieving factors: not identified at eval   PRECAUTIONS: None  WEIGHT BEARING RESTRICTIONS: No  FALLS:  Has patient fallen in last 6 months? Yes. Number of falls "tripped at work"   LIVING ENVIRONMENT: Lives with: lives with their spouse Lives in: House/apartment Stairs: Yes: External: 2 steps; none Has following equipment at home: None  OCCUPATION: works for [employer]   PLOF: Independent with basic ADLs, Independent with gait, and Vocation/Vocational requirements: Standing/walking greater than 8 hours  PATIENT GOALS: To decrease pain, improve standing and walking tolerance in order to return to work, conservative management in order to avoid surgical intervention  NEXT MD VISIT: 10/21/22  OBJECTIVE:   DIAGNOSTIC FINDINGS:  07/11/22 BIL Foot x-ray perform; results in Dr. Lenn Sink, DPM office note from same day.   X-rays indicate moderate depression of the arch no other indications of pathology first MPJ which was fixed several  years ago appears stable and looks good   PATIENT SURVEYS:  FOTO 65 current, 71 predicted   COGNITION: Overall cognitive status: Within functional limits for tasks assessed    PALPATION: BIL moderate-to-severe tenderness to palpation along plantar fascia, medial longitudinal arch, medial heel, and medial heel.  She has palpable plantar fibroma with severe tenderness to palpation bilaterally.  Restricted mobility with manual plantar fascial  stretching.  LOWER EXTREMITY ROM: to be formally assessed at follow up   Active ROM Right eval Left eval  Hip flexion    Hip extension    Hip abduction    Hip adduction    Hip internal rotation    Hip external rotation    Knee flexion    Knee extension    Ankle dorsiflexion    Ankle plantarflexion    Ankle inversion    Ankle eversion     (Blank rows = not tested)  LOWER EXTREMITY MMT: To be formally assessed at follow-up  MMT Right eval Left eval  Hip flexion    Hip extension    Hip abduction    Hip adduction    Hip internal rotation    Hip external rotation    Knee flexion    Knee extension    Ankle dorsiflexion    Ankle plantarflexion    Ankle inversion    Ankle eversion     (Blank rows = not tested)  GAIT: Distance walked: 70 ft Assistive device utilized: None Level of assistance: Complete Independence Comments: Antalgic gait pattern including short step length bilaterally, forward flexed posture   TODAY'S TREATMENT:                                                                                                                               [facility] Adult PT Treatment:   11/13/2022  Therapeutic Exercise: NuStep level 6 x 8 min for warm up Towel scruches - 4# Marble pick up x 2  Slant board stretch, 3 x 30 sec  Heel raises with heel hang on foam x 20 Lateral walks on airex beam with heel hang x 2 laps  Foot "peeling" raises and lowering x 4 each  PF stretch with sheet  Neuromuscular re-ed: 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  Discussed HEP after prolonged sitting and laying down for symptom management upon weightbearing Person educated: Patient Education method: Explanation, Demonstration, and Handouts Education comprehension: verbalized  understanding, returned demonstration, and needs further education  HOME EXERCISE PROGRAM: Access Code: [email or web address] URL: [email or web address] Date: 11/06/2022 Prepared by: Alphonzo Severance  Exercises - Heel Raises with Counter Support  - 1 x daily - 7 x weekly - 3 sets - 10 reps - Long Sitting Plantar Fascia Stretch with Towel  - 3 x daily -  7 x weekly - 1 sets - 2 reps - 2 minutes hold - Seated Toe Towel Scrunches  - 1 x daily - 7 x weekly - 3 sets - 10 reps - Seated Marble Pick-Up with Toes  - 1 x daily - 7 x weekly - 3 sets - 10 reps  ASSESSMENT:  CLINICAL IMPRESSION: Patient continues to demonstrate good response to skilled physical therapy.  She reports minimal pain levels, without exacerbation related to therapeutic exercise.  She was able to progress to lateral walking with heel hanging on Airex beam.  Will continue to progress as appropriate and maximize independence with home program in preparation for discharge from physical therapy when indicated.   OBJECTIVE IMPAIRMENTS: Abnormal gait, decreased activity tolerance, decreased endurance, decreased mobility, decreased strength, impaired flexibility, and pain.   ACTIVITY LIMITATIONS: carrying, lifting, standing, stairs, transfers, and locomotion level  PARTICIPATION LIMITATIONS: meal prep, cleaning, driving, shopping, community activity, and occupation  PERSONAL FACTORS: Past/current experiences, Profession, Time since onset of injury/illness/exacerbation, and 1-2 comorbidities: PMHx includes: anxiety, atrial fibrillation, migraines  are also affecting patient's functional outcome.   REHAB POTENTIAL: Fair    CLINICAL DECISION MAKING: Evolving/moderate complexity  EVALUATION COMPLEXITY: Moderate   GOALS: Goals reviewed with patient? Yes  SHORT TERM GOALS: Target date: 11/15/2022   Patient will be independent with initial home program for goal foot mobility, management of plantar fifth symptoms.  Baseline:  Initiated at eval  Goal status: INITIAL  2.  Patient will report ability to tolerate standing for 10 to 15 minutes in order to perform normal ADLs/IADLs without exacerbation of symptoms.  Baseline: unable  Goal status: INITIAL    LONG TERM GOALS: Target date: 12/13/2022   Patient will report improved overall functional ability with FOTO score of 70 or greater.  Baseline: 65 Goal status: INITIAL  2.  Patient will report ability to tolerate standing for 30 minutes to an hour without significant exacerbation of symptoms. Baseline: Unable to tolerate Goal status: INITIAL  3.  Patient will report no pain while driving up to 30 minutes in order to perform normal household errands and commute to work. Baseline: Unable to tolerate greater than 5 to 10 minutes Goal status: INITIAL  4.  Patient will demonstrate ability to tolerate uneven surfaces, and incline surfaces without significant exacerbation of pain for at least 10 minutes or greater. Baseline: Unable to tolerate Goal status: INITIAL  5.  Patient will be independent with self-management including modalities as indicated and use of prescribed home program.   Baseline: Initiated at evaluation Goal status: INITIAL    PLAN:  PT FREQUENCY: 2x/week  PT DURATION: 8 weeks  PLANNED INTERVENTIONS: Therapeutic exercises, Therapeutic activity, Neuromuscular re-education, Balance training, Gait training, Patient/Family education, Self Care, Joint mobilization, Stair training, Aquatic Therapy, Dry Needling, Electrical stimulation, Cryotherapy, Moist heat, Taping, Manual therapy, and Re-evaluation  PLAN FOR NEXT SESSION: Complete ankle movement mobility and strength assessment.  Initiate written HEP and review.  Manual therapy as indicated, pain modulation activities.  Patient education related to self-management.   Mauri Reading, PT, DPT 11/13/2022, 6:55 PM

## 2022-11-14 NOTE — Therapy (Deleted)
Daily Note   Patient Name: Brenda King MRN: [id number] DOB:[birth year], 62 y.o., female Today's Date: 11/14/2022  END OF SESSION:     Past Medical History:  Diagnosis Date   Anxiety    Atrial fibrillation [facility])    Status post ablation 2004 and 2005 - Dr. Sampson Goon [facility])   Constipation    Degenerative disc disease, cervical    Menopause    Nodular infiltrative basal cell carcinoma (BCC) 07/04/2021   Left Shoulder   Past Surgical History:  Procedure Laterality Date   ABDOMINAL HYSTERECTOMY     BREAST ENHANCEMENT SURGERY  2001    CARDIAC ELECTROPHYSIOLOGY MAPPING AND ABLATION     CYSTOSCOPY W/ RETROGRADES Bilateral 04/17/2016   Procedure: CYSTOSCOPY WITH BILATERAL RETROGRADE PYELOGRAMS;  Surgeon: Malen Gauze, MD;  Location: [facility];  Service: Urology;  Laterality: Bilateral;   PARTIAL HYSTERECTOMY  2000    Patient Active Problem List   Diagnosis Date Noted   Midline low back pain without sciatica 10/02/2022   Migraine without aura and without status migrainosus, not intractable 06/03/2017   Chronic idiopathic constipation 02/04/2013   Allergic rhinitis 07/09/2012    PCP: Tommie Sams, DO  REFERRING PROVIDER: Pilar Plate, DPM  REFERRING DIAG: Plantar fasciitis, left [M72.2], Plantar fasciitis of right foot [M72.2]   THERAPY DIAG:  No diagnosis found.  Rationale for Evaluation and Treatment: Rehabilitation  ONSET DATE: 2 years ago  SUBJECTIVE:   SUBJECTIVE STATEMENT Patient states that PT has been doing better with her pain and feels that it has been noticing her fibromas shrinking. She has improvement in symptoms using "boot" with heat packs that they gave him.    PERTINENT HISTORY: PMHx includes: anxiety, atrial fibrillation, migraines Patient reporting hx of Rt plantar fascia procedure and Rt bunionectomy procedure in August 2022.   PAIN:  Are you having pain? Yes: NPRS scale: Rt foot 2-9/10, Lt foot 2-4/10 Pain location: bilateral  foot pain (medial plantar and heel pain)   Pain description: throbbing  Aggravating factors: weight bearing activities  Relieving factors: not identified at eval   PRECAUTIONS: None  WEIGHT BEARING RESTRICTIONS: No  FALLS:  Has patient fallen in last 6 months? Yes. Number of falls "tripped at work"   LIVING ENVIRONMENT: Lives with: lives with their spouse Lives in: House/apartment Stairs: Yes: External: 2 steps; none Has following equipment at home: None  OCCUPATION: works for [employer]   PLOF: Independent with basic ADLs, Independent with gait, and Vocation/Vocational requirements: Standing/walking greater than 8 hours  PATIENT GOALS: To decrease pain, improve standing and walking tolerance in order to return to work, conservative management in order to avoid surgical intervention  NEXT MD VISIT: 10/21/22  OBJECTIVE:   DIAGNOSTIC FINDINGS:  07/11/22 BIL Foot x-ray perform; results in Dr. Lenn Sink, DPM office note from same day.   X-rays indicate moderate depression of the arch no other indications of pathology first MPJ which was fixed several years ago appears stable and looks good   PATIENT SURVEYS:  FOTO 65 current, 71 predicted   COGNITION: Overall cognitive status: Within functional limits for tasks assessed    PALPATION: BIL moderate-to-severe tenderness to palpation along plantar fascia, medial longitudinal arch, medial heel, and medial heel.  She has palpable plantar fibroma with severe tenderness to palpation bilaterally.  Restricted mobility with manual plantar fascial stretching.  LOWER EXTREMITY ROM: to be formally assessed at follow up   Active ROM Right eval Left eval  Hip flexion  Hip extension    Hip abduction    Hip adduction    Hip internal rotation    Hip external rotation    Knee flexion    Knee extension    Ankle dorsiflexion    Ankle plantarflexion    Ankle inversion    Ankle eversion     (Blank rows = not  tested)  LOWER EXTREMITY MMT: To be formally assessed at follow-up  MMT Right eval Left eval  Hip flexion    Hip extension    Hip abduction    Hip adduction    Hip internal rotation    Hip external rotation    Knee flexion    Knee extension    Ankle dorsiflexion    Ankle plantarflexion    Ankle inversion    Ankle eversion     (Blank rows = not tested)  GAIT: Distance walked: 70 ft Assistive device utilized: None Level of assistance: Complete Independence Comments: Antalgic gait pattern including short step length bilaterally, forward flexed posture   TODAY'S TREATMENT:                                                                                                                               [facility] Adult PT Treatment:   11/13/2022  Therapeutic Exercise: NuStep level 6 x 8 min for warm up Towel scruches - 4# Marble pick up x 2  Slant board stretch, 3 x 30 sec  Heel raises with heel hang on foam x 20 Lateral walks on airex beam with heel hang x 2 laps  Foot "peeling" raises and lowering x 4 each  PF stretch with sheet  Neuromuscular re-ed: 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  Discussed HEP after prolonged sitting and laying down for symptom management upon weightbearing Person educated: Patient Education method: Explanation, Demonstration, and Handouts Education comprehension: verbalized understanding, returned demonstration, and needs further education  HOME EXERCISE PROGRAM: Access Code: [email or web address] URL: [email or web address] Date: 11/06/2022 Prepared by: Alphonzo Severance  Exercises - Heel Raises with Counter Support  - 1 x daily - 7 x weekly - 3 sets - 10 reps - Long Sitting Plantar Fascia Stretch with Towel  - 3 x daily - 7 x weekly - 1  sets - 2 reps - 2 minutes hold - Seated Toe Towel Scrunches  - 1 x daily - 7 x weekly - 3 sets - 10 reps - Seated Marble Pick-Up with Toes  - 1 x daily - 7 x weekly - 3 sets - 10 reps  ASSESSMENT:  CLINICAL IMPRESSION: Patient continues to demonstrate good response to skilled physical therapy.  She reports minimal pain levels, without exacerbation related to therapeutic exercise.  She was able to progress to lateral walking with heel hanging  on Airex beam.  Will continue to progress as appropriate and maximize independence with home program in preparation for discharge from physical therapy when indicated.   OBJECTIVE IMPAIRMENTS: Abnormal gait, decreased activity tolerance, decreased endurance, decreased mobility, decreased strength, impaired flexibility, and pain.   ACTIVITY LIMITATIONS: carrying, lifting, standing, stairs, transfers, and locomotion level  PARTICIPATION LIMITATIONS: meal prep, cleaning, driving, shopping, community activity, and occupation  PERSONAL FACTORS: Past/current experiences, Profession, Time since onset of injury/illness/exacerbation, and 1-2 comorbidities: PMHx includes: anxiety, atrial fibrillation, migraines  are also affecting patient's functional outcome.   REHAB POTENTIAL: Fair    CLINICAL DECISION MAKING: Evolving/moderate complexity  EVALUATION COMPLEXITY: Moderate   GOALS: Goals reviewed with patient? Yes  SHORT TERM GOALS: Target date: 11/15/2022   Patient will be independent with initial home program for goal foot mobility, management of plantar fifth symptoms.  Baseline: Initiated at eval  Goal status: INITIAL  2.  Patient will report ability to tolerate standing for 10 to 15 minutes in order to perform normal ADLs/IADLs without exacerbation of symptoms.  Baseline: unable  Goal status: INITIAL    LONG TERM GOALS: Target date: 12/13/2022   Patient will report improved overall functional ability with FOTO score of 70 or greater.   Baseline: 65 Goal status: INITIAL  2.  Patient will report ability to tolerate standing for 30 minutes to an hour without significant exacerbation of symptoms. Baseline: Unable to tolerate Goal status: INITIAL  3.  Patient will report no pain while driving up to 30 minutes in order to perform normal household errands and commute to work. Baseline: Unable to tolerate greater than 5 to 10 minutes Goal status: INITIAL  4.  Patient will demonstrate ability to tolerate uneven surfaces, and incline surfaces without significant exacerbation of pain for at least 10 minutes or greater. Baseline: Unable to tolerate Goal status: INITIAL  5.  Patient will be independent with self-management including modalities as indicated and use of prescribed home program.   Baseline: Initiated at evaluation Goal status: INITIAL    PLAN:  PT FREQUENCY: 2x/week  PT DURATION: 8 weeks  PLANNED INTERVENTIONS: Therapeutic exercises, Therapeutic activity, Neuromuscular re-education, Balance training, Gait training, Patient/Family education, Self Care, Joint mobilization, Stair training, Aquatic Therapy, Dry Needling, Electrical stimulation, Cryotherapy, Moist heat, Taping, Manual therapy, and Re-evaluation  PLAN FOR NEXT SESSION: Complete ankle movement mobility and strength assessment.  Initiate written HEP and review.  Manual therapy as indicated, pain modulation activities.  Patient education related to self-management.   Hildred Laser, PT, DPT 11/14/2022, 10:32 AM

## 2022-11-15 ENCOUNTER — Ambulatory Visit: Payer: BLUE CROSS/BLUE SHIELD

## 2022-11-20 ENCOUNTER — Ambulatory Visit: Payer: BLUE CROSS/BLUE SHIELD | Attending: Podiatry

## 2022-11-20 DIAGNOSIS — M79672 Pain in left foot: Secondary | ICD-10-CM | POA: Insufficient documentation

## 2022-11-20 DIAGNOSIS — M79671 Pain in right foot: Secondary | ICD-10-CM | POA: Insufficient documentation

## 2022-11-20 NOTE — Therapy (Signed)
Daily Note   Patient Name: Brenda King MRN: [id number] DOB:[birth year], 62 y.o., female Today's Date: 11/20/2022  END OF SESSION:  PT End of Session - 11/20/22 1745     Visit Number 7    Number of Visits 17    Date for PT Re-Evaluation 12/13/22    Authorization Type BC[employer]    PT Start Time 1745    PT Stop Time 1825    PT Time Calculation (min) 40 min    Activity Tolerance Patient tolerated treatment well    Behavior During Therapy [facility] for tasks assessed/performed              Past Medical History:  Diagnosis Date   Anxiety    Atrial fibrillation (HCC)    Status post ablation 2004 and 2005 - Dr. Sampson Goon [facility])   Constipation    Degenerative disc disease, cervical    Menopause    Nodular infiltrative basal cell carcinoma (BCC) 07/04/2021   Left Shoulder   Past Surgical History:  Procedure Laterality Date   ABDOMINAL HYSTERECTOMY     BREAST ENHANCEMENT SURGERY  2001    CARDIAC ELECTROPHYSIOLOGY MAPPING AND ABLATION     CYSTOSCOPY W/ RETROGRADES Bilateral 04/17/2016   Procedure: CYSTOSCOPY WITH BILATERAL RETROGRADE PYELOGRAMS;  Surgeon: Malen Gauze, MD;  Location: [facility];  Service: Urology;  Laterality: Bilateral;   PARTIAL HYSTERECTOMY  2000    Patient Active Problem List   Diagnosis Date Noted   Midline low back pain without sciatica 10/02/2022   Migraine without aura and without status migrainosus, not intractable 06/03/2017   Chronic idiopathic constipation 02/04/2013   Allergic rhinitis 07/09/2012    PCP: Tommie Sams, DO  REFERRING PROVIDER: Pilar Plate, DPM  REFERRING DIAG: Plantar fasciitis, left [M72.2], Plantar fasciitis of right foot [M72.2]   THERAPY DIAG:  Pain in left foot  Pain in right foot  Rationale for Evaluation and Treatment: Rehabilitation  ONSET DATE: 2 years ago  SUBJECTIVE:   SUBJECTIVE STATEMENT Patient continues to state that she feels her fibromas are shrinking.  She is concerned about returning to  work next week due to probable worsening of symptoms walking on concrete floors.  She is hoping to continue physical therapy, to maximize benefit.  Patient will see referring provider prior to next session.   PERTINENT HISTORY: PMHx includes: anxiety, atrial fibrillation, migraines Patient reporting hx of Rt plantar fascia procedure and Rt bunionectomy procedure in August 2022.   PAIN:  Are you having pain? Yes: NPRS scale: Rt foot 2-9/10, Lt foot 2-4/10 Pain location: bilateral foot pain (medial plantar and heel pain)   Pain description: throbbing  Aggravating factors: weight bearing activities  Relieving factors: not identified at eval   PRECAUTIONS: None  WEIGHT BEARING RESTRICTIONS: No  FALLS:  Has patient fallen in last 6 months? Yes. Number of falls "tripped at work"   LIVING ENVIRONMENT: Lives with: lives with their spouse Lives in: House/apartment Stairs: Yes: External: 2 steps; none Has following equipment at home: None  OCCUPATION: works for [employer]   PLOF: Independent with basic ADLs, Independent with gait, and Vocation/Vocational requirements: Standing/walking greater than 8 hours  PATIENT GOALS: To decrease pain, improve standing and walking tolerance in order to return to work, conservative management in order to avoid surgical intervention  NEXT MD VISIT: 10/21/22  OBJECTIVE:   DIAGNOSTIC FINDINGS:  07/11/22 BIL Foot x-ray perform; results in Dr. Lenn Sink, DPM office note from same day.   X-rays indicate  moderate depression of the arch no other indications of pathology first MPJ which was fixed several years ago appears stable and looks good   PATIENT SURVEYS:  FOTO 65 current, 71 predicted   COGNITION: Overall cognitive status: Within functional limits for tasks assessed    PALPATION: BIL moderate-to-severe tenderness to palpation along plantar fascia, medial longitudinal arch, medial heel, and medial heel.  She has palpable  plantar fibroma with severe tenderness to palpation bilaterally.  Restricted mobility with manual plantar fascial stretching.  LOWER EXTREMITY ROM: to be formally assessed at follow up   Active ROM Right eval Left eval  Hip flexion    Hip extension    Hip abduction    Hip adduction    Hip internal rotation    Hip external rotation    Knee flexion    Knee extension    Ankle dorsiflexion    Ankle plantarflexion    Ankle inversion    Ankle eversion     (Blank rows = not tested)  LOWER EXTREMITY MMT: To be formally assessed at follow-up  MMT Right eval Left eval  Hip flexion    Hip extension    Hip abduction    Hip adduction    Hip internal rotation    Hip external rotation    Knee flexion    Knee extension    Ankle dorsiflexion    Ankle plantarflexion    Ankle inversion    Ankle eversion     (Blank rows = not tested)  GAIT: Distance walked: 70 ft Assistive device utilized: None Level of assistance: Complete Independence Comments: Antalgic gait pattern including short step length bilaterally, forward flexed posture   TODAY'S TREATMENT:                                                                                                                               [facility] Adult PT Treatment:              11/20/22  Therapeutic Exercise: NuStep level 6 x 5 min for warm up Towel scruches - 5# Marble pick up x 2  Slant board stretch, 3 x 30 sec  Heel raises with heel hang on foam x 20 Lateral walks on airex beam with heel hang x 2 laps  Foot "peeling" raises and lowering x 10 each  PF stretch with sheet SLS on foam, 2 x 30 sec each side     PATIENT EDUCATION:  Education details: reviewed initial home exercise program; discussion of POC, prognosis and goals for skilled PT; encouraged use of cold pack/frozen water bottle for pain management.  Discussed HEP after prolonged sitting and laying down for symptom management upon weightbearing Person educated:  Patient Education method: Explanation, Demonstration, and Handouts Education comprehension: verbalized understanding, returned demonstration, and needs further education  HOME EXERCISE PROGRAM: Access Code: [email or web address] URL: [email or web address] Date: 11/06/2022 Prepared by: Alphonzo Severance  Exercises - Heel Raises with Counter Support  - 1  x daily - 7 x weekly - 3 sets - 10 reps - Long Sitting Plantar Fascia Stretch with Towel  - 3 x daily - 7 x weekly - 1 sets - 2 reps - 2 minutes hold - Seated Toe Towel Scrunches  - 1 x daily - 7 x weekly - 3 sets - 10 reps - Seated Marble Pick-Up with Toes  - 1 x daily - 7 x weekly - 3 sets - 10 reps  ASSESSMENT:  CLINICAL IMPRESSION: Patient continues to respond to exercises well without exacerbation of symptoms at end of session.  She is demonstrating improved navigation of compliant surfaces, improved foot intrinsic muscle strength, and reports improvement with overall symptoms.  However she would benefit from additional physical therapy to address standing and walking tolerance, in order to prepare for return to work.   OBJECTIVE IMPAIRMENTS: Abnormal gait, decreased activity tolerance, decreased endurance, decreased mobility, decreased strength, impaired flexibility, and pain.   ACTIVITY LIMITATIONS: carrying, lifting, standing, stairs, transfers, and locomotion level  PARTICIPATION LIMITATIONS: meal prep, cleaning, driving, shopping, community activity, and occupation  PERSONAL FACTORS: Past/current experiences, Profession, Time since onset of injury/illness/exacerbation, and 1-2 comorbidities: PMHx includes: anxiety, atrial fibrillation, migraines  are also affecting patient's functional outcome.   REHAB POTENTIAL: Fair    CLINICAL DECISION MAKING: Evolving/moderate complexity  EVALUATION COMPLEXITY: Moderate   GOALS: Goals reviewed with patient? Yes  SHORT TERM GOALS: Target date: 11/15/2022   Patient will be  independent with initial home program for goal foot mobility, management of plantar fifth symptoms.  Baseline: Initiated at eval  Goal status: Met  2.  Patient will report ability to tolerate standing for 10 to 15 minutes in order to perform normal ADLs/IADLs without exacerbation of symptoms.  Baseline: unable  Goal status: Nearly met   LONG TERM GOALS: Target date: 12/13/2022   Patient will report improved overall functional ability with FOTO score of 70 or greater.  Baseline: 65 Goal status: INITIAL  2.  Patient will report ability to tolerate standing for 30 minutes to an hour without significant exacerbation of symptoms. Baseline: Unable to tolerate Goal status: INITIAL  3.  Patient will report no pain while driving up to 30 minutes in order to perform normal household errands and commute to work. Baseline: Unable to tolerate greater than 5 to 10 minutes Goal status: INITIAL  4.  Patient will demonstrate ability to tolerate uneven surfaces, and incline surfaces without significant exacerbation of pain for at least 10 minutes or greater. Baseline: Unable to tolerate Goal status: INITIAL  5.  Patient will be independent with self-management including modalities as indicated and use of prescribed home program.   Baseline: Initiated at evaluation Goal status: INITIAL    PLAN:  PT FREQUENCY: 2x/week  PT DURATION: 8 weeks  PLANNED INTERVENTIONS: Therapeutic exercises, Therapeutic activity, Neuromuscular re-education, Balance training, Gait training, Patient/Family education, Self Care, Joint mobilization, Stair training, Aquatic Therapy, Dry Needling, Electrical stimulation, Cryotherapy, Moist heat, Taping, Manual therapy, and Re-evaluation  PLAN FOR NEXT SESSION: Complete ankle movement mobility and strength assessment.  Initiate written HEP and review.  Manual therapy as indicated, pain modulation activities.  Patient education related to self-management.   Mauri Reading,  PT, DPT 11/20/2022, 6:55 PM

## 2022-11-22 ENCOUNTER — Encounter: Payer: Self-pay | Admitting: Podiatry

## 2022-11-22 ENCOUNTER — Ambulatory Visit (INDEPENDENT_AMBULATORY_CARE_PROVIDER_SITE_OTHER): Payer: BLUE CROSS/BLUE SHIELD | Admitting: Podiatry

## 2022-11-22 ENCOUNTER — Ambulatory Visit: Payer: BLUE CROSS/BLUE SHIELD

## 2022-11-22 DIAGNOSIS — M79672 Pain in left foot: Secondary | ICD-10-CM | POA: Diagnosis not present

## 2022-11-22 DIAGNOSIS — M79671 Pain in right foot: Secondary | ICD-10-CM

## 2022-11-22 DIAGNOSIS — M722 Plantar fascial fibromatosis: Secondary | ICD-10-CM | POA: Diagnosis not present

## 2022-11-22 MED ORDER — TRIAMCINOLONE ACETONIDE 10 MG/ML IJ SUSP
10.0000 mg | Freq: Once | INTRAMUSCULAR | Status: AC
  Administered 2022-11-22: 10 mg via INTRA_ARTICULAR

## 2022-11-22 NOTE — Therapy (Signed)
Daily Note   Patient Name: Brenda King MRN: [id number] DOB:1960-03-20, 62 y.o., female Today's Date: 11/22/2022  END OF SESSION:  PT End of Session - 11/22/22 1348     Visit Number 8    Number of Visits 17    Date for PT Re-Evaluation 12/13/22    Authorization Type [employer]    PT Start Time 1350    PT Stop Time 1425    PT Time Calculation (min) 35 min    Activity Tolerance Patient tolerated treatment well    Behavior During Therapy [facility] for tasks assessed/performed               Past Medical History:  Diagnosis Date   Anxiety    Atrial fibrillation (HCC)    Status post ablation 2004 and 2005 - Dr. Sampson Goon [facility])   Constipation    Degenerative disc disease, cervical    Menopause    Nodular infiltrative basal cell carcinoma (BCC) 07/04/2021   Left Shoulder   Past Surgical History:  Procedure Laterality Date   ABDOMINAL HYSTERECTOMY     BREAST ENHANCEMENT SURGERY  2001    CARDIAC ELECTROPHYSIOLOGY MAPPING AND ABLATION     CYSTOSCOPY W/ RETROGRADES Bilateral 04/17/2016   Procedure: CYSTOSCOPY WITH BILATERAL RETROGRADE PYELOGRAMS;  Surgeon: Malen Gauze, MD;  Location: [facility];  Service: Urology;  Laterality: Bilateral;   PARTIAL HYSTERECTOMY  2000    Patient Active Problem List   Diagnosis Date Noted   Midline low back pain without sciatica 10/02/2022   Migraine without aura and without status migrainosus, not intractable 06/03/2017   Chronic idiopathic constipation 02/04/2013   Allergic rhinitis 07/09/2012    PCP: Tommie Sams, DO  REFERRING PROVIDER: Pilar Plate, DPM  REFERRING DIAG: Plantar fasciitis, left [M72.2], Plantar fasciitis of right foot [M72.2]   THERAPY DIAG:  Pain in right foot  Pain in left foot  Rationale for Evaluation and Treatment: Rehabilitation  ONSET DATE: 2 years ago  SUBJECTIVE:   SUBJECTIVE STATEMENT Patient states that she was seen by Dr. Charlsie Merles, DPM today who extended her tentative return to work date  to 12/23/22. Plan is to f/u with referring provider's office on 12/20/22. She also states "they put another shot in my left foot, so it my foot is hurting right now."     PERTINENT HISTORY: PMHx includes: anxiety, atrial fibrillation, migraines Patient reporting hx of Rt plantar fascia procedure and Rt bunionectomy procedure in August 2022.   PAIN:  Are you having pain? Yes: NPRS scale: Rt foot 2-9/10, Lt foot 2-4/10 Pain location: bilateral foot pain (medial plantar and heel pain)   Pain description: throbbing  Aggravating factors: weight bearing activities  Relieving factors: not identified at eval   PRECAUTIONS: None  WEIGHT BEARING RESTRICTIONS: No  FALLS:  Has patient fallen in last 6 months? Yes. Number of falls "tripped at work"   LIVING ENVIRONMENT: Lives with: lives with their spouse Lives in: House/apartment Stairs: Yes: External: 2 steps; none Has following equipment at home: None  OCCUPATION: works for [employer]   PLOF: Independent with basic ADLs, Independent with gait, and Vocation/Vocational requirements: Standing/walking greater than 8 hours  PATIENT GOALS: To decrease pain, improve standing and walking tolerance in order to return to work, conservative management in order to avoid surgical intervention  NEXT MD VISIT: 10/21/22  OBJECTIVE:   DIAGNOSTIC FINDINGS:  07/11/22 BIL Foot x-ray perform; results in Dr. Lenn Sink, DPM office note from same day.   X-rays  indicate moderate depression of the arch no other indications of pathology first MPJ which was fixed several years ago appears stable and looks good   PATIENT SURVEYS:  FOTO 65 current, 71 predicted   COGNITION: Overall cognitive status: Within functional limits for tasks assessed    PALPATION: BIL moderate-to-severe tenderness to palpation along plantar fascia, medial longitudinal arch, medial heel, and medial heel.  She has palpable plantar fibroma with severe tenderness to  palpation bilaterally.  Restricted mobility with manual plantar fascial stretching.  LOWER EXTREMITY ROM: to be formally assessed at follow up   Active ROM Right eval Left eval  Hip flexion    Hip extension    Hip abduction    Hip adduction    Hip internal rotation    Hip external rotation    Knee flexion    Knee extension    Ankle dorsiflexion    Ankle plantarflexion    Ankle inversion    Ankle eversion     (Blank rows = not tested)  LOWER EXTREMITY MMT: To be formally assessed at follow-up  MMT Right eval Left eval  Hip flexion    Hip extension    Hip abduction    Hip adduction    Hip internal rotation    Hip external rotation    Knee flexion    Knee extension    Ankle dorsiflexion    Ankle plantarflexion    Ankle inversion    Ankle eversion     (Blank rows = not tested)  GAIT: Distance walked: 70 ft Assistive device utilized: None Level of assistance: Complete Independence Comments: Antalgic gait pattern including short step length bilaterally, forward flexed posture   TODAY'S TREATMENT:                                                                                                                               [facility] Adult PT Treatment:              11/22/22  Therapeutic Exercise: NuStep level 6 x 5 min for warm up Towel scruches - 5# Marble pick up x 2   Therapeutic Activity:  Subjective assessment regarding progress towards established goals and plan for independence ongoing skilled PT through remaining POC   [facility] Adult PT Treatment:              11/20/22  Therapeutic Exercise: NuStep level 6 x 5 min for warm up Towel scruches - 5# Marble pick up x 2  Slant board stretch, 3 x 30 sec  Heel raises with heel hang on foam x 20 Lateral walks on airex beam with heel hang x 2 laps  Foot "peeling" raises and lowering x 10 each  PF stretch with sheet SLS on foam, 2 x 30 sec each side     PATIENT EDUCATION:  Education details: reviewed initial home  exercise program; discussion of POC, prognosis and goals for skilled PT; encouraged use of cold pack/frozen  water bottle for pain management.  Discussed HEP after prolonged sitting and laying down for symptom management upon weightbearing Person educated: Patient Education method: Explanation, Demonstration, and Handouts Education comprehension: verbalized understanding, returned demonstration, and needs further education  HOME EXERCISE PROGRAM: Access Code: [email or web address] URL: [email or web address] Date: 11/06/2022 Prepared by: Alphonzo Severance  Exercises - Heel Raises with Counter Support  - 1 x daily - 7 x weekly - 3 sets - 10 reps - Long Sitting Plantar Fascia Stretch with Towel  - 3 x daily - 7 x weekly - 1 sets - 2 reps - 2 minutes hold - Seated Toe Towel Scrunches  - 1 x daily - 7 x weekly - 3 sets - 10 reps - Seated Marble Pick-Up with Toes  - 1 x daily - 7 x weekly - 3 sets - 10 reps  ASSESSMENT:  CLINICAL IMPRESSION: Joselynn is limited with therapeutic exercises today d/t receiving injection prior to today's session. She has related pain and soreness with weight bearing activity. She was able to tolerate exercises for foot intrinsic muscle strength and toe mobility. Encouraged patient to comply with podiatrist's post-injection instructions, including waiting before beginning walking program as previously discussed. She is agreeable to this plan. We will continue to see Tomeka 1-2x/week in order to address remaining functional deficits, and monitor any improvements post injection.    OBJECTIVE IMPAIRMENTS: Abnormal gait, decreased activity tolerance, decreased endurance, decreased mobility, decreased strength, impaired flexibility, and pain.   ACTIVITY LIMITATIONS: carrying, lifting, standing, stairs, transfers, and locomotion level  PARTICIPATION LIMITATIONS: meal prep, cleaning, driving, shopping, community activity, and occupation   PERSONAL FACTORS: Past/current  experiences, Profession, Time since onset of injury/illness/exacerbation, and 1-2 comorbidities: PMHx includes: anxiety, atrial fibrillation, migraines  are also affecting patient's functional outcome.   REHAB POTENTIAL: Fair    CLINICAL DECISION MAKING: Evolving/moderate complexity  EVALUATION COMPLEXITY: Moderate   GOALS: Goals reviewed with patient? Yes  SHORT TERM GOALS: Target date: 11/15/2022   Patient will be independent with initial home program for goal foot mobility, management of plantar fifth symptoms.  Baseline: Initiated at eval  Goal status: Met  2.  Patient will report ability to tolerate standing for 10 to 15 minutes in order to perform normal ADLs/IADLs without exacerbation of symptoms.  Baseline: unable  Goal status: Nearly met   LONG TERM GOALS: Target date: 12/13/2022   Patient will report improved overall functional ability with FOTO score of 70 or greater.  Baseline: 65 11/22/22: 60 Goal status: ONGOING   2.  Patient will report ability to tolerate standing for 30 minutes to an hour without significant exacerbation of symptoms. Baseline: Unable to tolerate 11/22/22: "Was able to cook for 1 hour with some seated breaks."  Goal status: ONGOING   3.  Patient will report no pain while driving up to 30 minutes in order to perform normal household errands and commute to work. Baseline: Unable to tolerate greater than 5 to 10 minutes 11/22/22: no problems Goal status: ONGOING   4.  Patient will demonstrate ability to tolerate uneven surfaces, and incline surfaces without significant exacerbation of pain for at least 10 minutes or greater. Baseline: Unable to tolerate 11/22/22: some discomfort/irritation  Goal status: ONGOING   5.  Patient will be independent with self-management including modalities as indicated and use of prescribed home program.   Baseline: Initiated at evaluation Goal status: ONGOING     PLAN:  PT FREQUENCY: 2x/week  PT DURATION: 8  weeks  PLANNED INTERVENTIONS: Therapeutic exercises, Therapeutic  activity, Neuromuscular re-education, Balance training, Gait training, Patient/Family education, Self Care, Joint mobilization, Stair training, Aquatic Therapy, Dry Needling, Electrical stimulation, Cryotherapy, Moist heat, Taping, Manual therapy, and Re-evaluation  PLAN FOR NEXT SESSION: Complete ankle movement mobility and strength assessment.  Initiate written HEP and review.  Manual therapy as indicated, pain modulation activities.  Patient education related to self-management.   Mauri Reading, PT, DPT 11/22/2022, 2:47 PM

## 2022-11-24 NOTE — Progress Notes (Signed)
Subjective:   Patient ID: Brenda King, female   DOB: 62 y.o.   MRN: [id number]   HPI Patient states her right 1 is improving but her left arch is remaining very tender and she has taken time off of work which has helped to reduce the size and the inflammation with it neuro   ROS      Objective:  Physical Exam  Vascular status intact with inflammation of the mid arch area left with nodule formation with the right improved      Assessment:  Inflammatory condition with fibroma formation left     Plan:  H&P reviewed sterile prep injected the mid fascia 3 mg Dexasone Kenalog 5 mg Xylocaine advised on continued reduced weightbearing on this stretching activities and heat ice therapy.  Reappoint to reevaluate may ultimately require surgery

## 2022-12-02 ENCOUNTER — Other Ambulatory Visit: Payer: Self-pay | Admitting: Podiatry

## 2022-12-02 NOTE — Telephone Encounter (Signed)
Drug interaction warnings

## 2022-12-20 ENCOUNTER — Ambulatory Visit: Payer: BLUE CROSS/BLUE SHIELD | Admitting: Podiatry

## 2022-12-23 ENCOUNTER — Encounter: Payer: Self-pay | Admitting: Podiatry

## 2022-12-23 ENCOUNTER — Ambulatory Visit (INDEPENDENT_AMBULATORY_CARE_PROVIDER_SITE_OTHER): Payer: BLUE CROSS/BLUE SHIELD | Admitting: Podiatry

## 2022-12-23 DIAGNOSIS — M722 Plantar fascial fibromatosis: Secondary | ICD-10-CM

## 2022-12-24 ENCOUNTER — Telehealth: Payer: Self-pay | Admitting: Neurology

## 2022-12-24 NOTE — Telephone Encounter (Signed)
Pt husband came into office. Explained pt has had 3 serious migraines in the last month and one has lasted 8 days. States that pt forehead physically will swell up. States she is taking rizatriptan (MAXALT-MLT) 10 MG disintegrating tablet  like tik taks because she is in so much pain.  Pt is out of work right now and is unable to function. Wanted to see if there was any chance that pt could be worked in because of her ongoing issues.   Would like a call to discuss [phone]    [phone]

## 2022-12-25 NOTE — Progress Notes (Signed)
Subjective:   Patient ID: Brenda King, female   DOB: 62 y.o.   MRN: [id number]   HPI Patient states she seems to be gradually improving and states that she feels like if she can get some intense physical therapy that she will be able to resume normal activities   ROS      Objective:  Physical Exam  Neurovascular status intact nodular formation within the mid arch area bilateral still present but improving with diminished discomfort noted but still some nodular formation left left over right     Assessment:  Plantar fibromatosis bilateral along with plantar fasciitis generalized weakness in the legs from not being active for the last several months     Plan:  H&P referred to physical therapy gave strict instructions for exercises and treatment of areas with the consideration of iontophoresis or ultrasound.  Reappoint in 1 month

## 2022-12-31 ENCOUNTER — Other Ambulatory Visit: Payer: Self-pay | Admitting: Podiatry

## 2023-01-01 ENCOUNTER — Encounter: Payer: Self-pay | Admitting: Neurology

## 2023-01-01 ENCOUNTER — Ambulatory Visit (INDEPENDENT_AMBULATORY_CARE_PROVIDER_SITE_OTHER): Payer: BLUE CROSS/BLUE SHIELD | Admitting: Neurology

## 2023-01-01 VITALS — BP 109/68 | HR 77 | Ht 69.0 in | Wt 168.0 lb

## 2023-01-01 DIAGNOSIS — G43101 Migraine with aura, not intractable, with status migrainosus: Secondary | ICD-10-CM

## 2023-01-01 DIAGNOSIS — G43711 Chronic migraine without aura, intractable, with status migrainosus: Secondary | ICD-10-CM

## 2023-01-01 DIAGNOSIS — H539 Unspecified visual disturbance: Secondary | ICD-10-CM

## 2023-01-01 DIAGNOSIS — R51 Headache with orthostatic component, not elsewhere classified: Secondary | ICD-10-CM | POA: Diagnosis not present

## 2023-01-01 DIAGNOSIS — R519 Headache, unspecified: Secondary | ICD-10-CM

## 2023-01-01 MED ORDER — AJOVY 225 MG/1.5ML ~~LOC~~ SOAJ: 225.0000 mg | SUBCUTANEOUS | Status: DC
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
## 2023-01-01 NOTE — Patient Instructions (Addendum)
MRi of the brain w/wo contrast Bloodwork Take Nurtec every day for a few weeks to bridge you and then as needed  Start qulipta daily     There is increased risk for stroke in women with migraine with aura and a contraindication for the combined contraceptive pill for use by women who have migraine with aura. The risk for women with migraine without aura is lower. However other risk factors like smoking are far more likely to increase stroke risk than migraine. There is a recommendation for no smoking and for the use of OCPs without estrogen such as progestogen only pills particularly for women with migraine with aura.Marland Kitchen People who have migraine headaches with auras may be 3 times more likely to have a stroke caused by a blood clot, compared to migraine patients who don't see auras. Women who take hormone-replacement therapy may be 30 percent more likely to suffer a clot-based stroke than women not taking medication containing estrogen. Other risk factors like smoking and high blood pressure may be  much more important. And stroke is still a rare complication due to migraine aura and is controversial and lower doses may not cause a risk.  Atogepant Tablets What is this medication? ATOGEPANT (a TOE je pant) prevents migraines. It works by blocking a substance in the body that causes migraines. This medicine may be used for other purposes; ask your health care provider or pharmacist if you have questions. COMMON BRAND NAME(S): QULIPTA What should I tell my care team before I take this medication? They need to know if you have any of these conditions: Kidney disease Liver disease An unusual or allergic reaction to atogepant, other medications, foods, dyes, or preservatives Pregnant or trying to get pregnant Breast-feeding How should I use this medication? Take this medication by mouth with water. Take it as directed on the prescription label at the same time every day. You can take it with or  without food. If it upsets your stomach, take it with food. Keep taking it unless your care team tells you to stop. Talk to your care team about the use of this medication in children. Special care may be needed. Overdosage: If you think you have taken too much of this medicine contact a poison control center or emergency room at once. NOTE: This medicine is only for you. Do not share this medicine with others. What if I miss a dose? If you miss a dose, take it as soon as you can. If it is almost time for your next dose, take only that dose. Do not take double or extra doses. What may interact with this medication? Carbamazepine Certain medications for fungal infections, such as itraconazole, ketoconazole Clarithromycin Cyclosporine Efavirenz Etravirine Phenytoin Rifampin St. John's wort This list may not describe all possible interactions. Give your health care provider a list of all the medicines, herbs, non-prescription drugs, or dietary supplements you use. Also tell them if you smoke, drink alcohol, or use illegal drugs. Some items may interact with your medicine. What should I watch for while using this medication? Visit your care team for regular checks on your progress. Tell your care team if your symptoms do not start to get better or if they get worse. What side effects may I notice from receiving this medication? Side effects that you should report to your care team as soon as possible: Allergic reactions--skin rash, itching, hives, swelling of the face, lips, tongue, or throat Side effects that usually do not require medical attention (  report to your care team if they continue or are bothersome): Constipation Fatigue Loss of appetite with weight loss Nausea This list may not describe all possible side effects. Call your doctor for medical advice about side effects. You may report side effects to FDA at 1-800-FDA-1088. Where should I keep my medication? Keep out of the reach  of children and pets. Store at room temperature between 20 and 25 degrees C (68 and 77 degrees F). Get rid of any unused medication after the expiration date. To get rid of medications that are no longer needed or have expired: Take the medication to a medication take-back program. Check with your pharmacy or law enforcement to find a location. If you cannot return the medication, check the label or package insert to see if the medication should be thrown out in the garbage or flushed down the toilet. If you are not sure, ask your care team. If it is safe to put it in the trash, take the medication out of the container. Mix the medication with cat litter, dirt, coffee grounds, or other unwanted substance. Seal the mixture in a bag or container. Put it in the trash. NOTE: This sheet is a summary. It may not cover all possible information. If you have questions about this medicine, talk to your doctor, pharmacist, or health care provider.  2024 Elsevier/Gold Standard (2021-04-23 00:00:00)  Rimegepant Disintegrating Tablets What is this medication? RIMEGEPANT (ri ME je pant) prevents and treats migraines. It works by blocking a substance in the body that causes migraines. This medicine may be used for other purposes; ask your health care provider or pharmacist if you have questions. COMMON BRAND NAME(S): NURTEC ODT What should I tell my care team before I take this medication? They need to know if you have any of these conditions: Kidney disease Liver disease An unusual or allergic reaction to rimegepant, other medications, foods, dyes, or preservatives Pregnant or trying to get pregnant Breast-feeding How should I use this medication? Take this medication by mouth. Take it as directed on the prescription label. Leave the tablet in the sealed pack until you are ready to take it. With dry hands, open the pack and gently remove the tablet. If the tablet breaks or crumbles, throw it away. Use a new  tablet. Place the tablet in the mouth and allow it to dissolve. Then, swallow it. Do not cut, crush, or chew this medication. You do not need water to take this medication. Talk to your care team about the use of this medication in children. Special care may be needed. Overdosage: If you think you have taken too much of this medicine contact a poison control center or emergency room at once. NOTE: This medicine is only for you. Do not share this medicine with others. What if I miss a dose? This does not apply. This medication is not for regular use. What may interact with this medication? Certain medications for fungal infections, such as fluconazole, itraconazole Rifampin This list may not describe all possible interactions. Give your health care provider a list of all the medicines, herbs, non-prescription drugs, or dietary supplements you use. Also tell them if you smoke, drink alcohol, or use illegal drugs. Some items may interact with your medicine. What should I watch for while using this medication? Visit your care team for regular checks on your progress. Tell your care team if your symptoms do not start to get better or if they get worse. What side effects may I  notice from receiving this medication? Side effects that you should report to your care team as soon as possible: Allergic reactions--skin rash, itching, hives, swelling of the face, lips, tongue, or throat Side effects that usually do not require medical attention (report to your care team if they continue or are bothersome): Nausea Stomach pain This list may not describe all possible side effects. Call your doctor for medical advice about side effects. You may report side effects to FDA at 1-800-FDA-1088. Where should I keep my medication? Keep out of the reach of children and pets. Store at room temperature between 20 and 25 degrees C (68 and 77 degrees F). Get rid of any unused medication after the expiration date. To get  rid of medications that are no longer needed or have expired: Take the medication to a medication take-back program. Check with your pharmacy or law enforcement to find a location. If you cannot return the medication, check the label or package insert to see if the medication should be thrown out in the garbage or flushed down the toilet. If you are not sure, ask your care team. If it is safe to put it in the trash, take the medication out of the container. Mix the medication with cat litter, dirt, coffee grounds, or other unwanted substance. Seal the mixture in a bag or container. Put it in the trash. NOTE: This sheet is a summary. It may not cover all possible information. If you have questions about this medicine, talk to your doctor, pharmacist, or health care provider.  2024 Elsevier/Gold Standard (2021-04-25 00:00:00)   Vernell Barrier Injection What is this medication? FREMANEZUMAB (fre ma NEZ ue mab) prevents migraines. It works by blocking a substance in the body that causes migraines. It is a monoclonal antibody. This medicine may be used for other purposes; ask your health care provider or pharmacist if you have questions. COMMON BRAND NAME(S): AJOVY What should I tell my care team before I take this medication? They need to know if you have any of these conditions: An unusual or allergic reaction to fremanezumab, other medications, foods, dyes, or preservatives Pregnant or trying to get pregnant Breast-feeding How should I use this medication? This medication is injected under the skin. You will be taught how to prepare and give it. Take it as directed on the prescription label. Keep taking it unless your care team tells you to stop. It is important that you put your used needles and syringes in a special sharps container. Do not put them in a trash can. If you do not have a sharps container, call your pharmacist or care team to get one. Talk to your care team about the use of this  medication in children. Special care may be needed. Overdosage: If you think you have taken too much of this medicine contact a poison control center or emergency room at once. NOTE: This medicine is only for you. Do not share this medicine with others. What if I miss a dose? If you miss a dose, take it as soon as you can. If it is almost time for your next dose, take only that dose. Do not take double or extra doses. What may interact with this medication? Interactions are not expected. This list may not describe all possible interactions. Give your health care provider a list of all the medicines, herbs, non-prescription drugs, or dietary supplements you use. Also tell them if you smoke, drink alcohol, or use illegal drugs. Some items may interact with  your medicine. What should I watch for while using this medication? Tell your care team if your symptoms do not start to get better or if they get worse. What side effects may I notice from receiving this medication? Side effects that you should report to your care team as soon as possible: Allergic reactions or angioedema--skin rash, itching or hives, swelling of the face, eyes, lips, tongue, arms, or legs, trouble swallowing or breathing Side effects that usually do not require medical attention (report to your care team if they continue or are bothersome): Pain, redness, or irritation at injection site This list may not describe all possible side effects. Call your doctor for medical advice about side effects. You may report side effects to FDA at 1-800-FDA-1088. Where should I keep my medication? Keep out of the reach of children and pets. Store in a refrigerator or at room temperature between 20 and 25 degrees C (68 and 77 degrees F). Refrigeration (preferred): Store in the refrigerator. Do not freeze. Keep in the original container until you are ready to take it. Remove the dose from the carton about 30 minutes before it is time for you to  use it. If the dose is not used, it may be stored in the original container at room temperature for 7 days. Get rid of any unused medication after the expiration date. Room Temperature: This medication may be stored at room temperature for up to 7 days. Keep it in the original container. Protect from light until time of use. If it is stored at room temperature, get rid of any unused medication after 7 days or after it expires, whichever is first. To get rid of medications that are no longer needed or have expired: Take the medication to a medication take-back program. Check with your pharmacy or law enforcement to find a location. If you cannot return the medication, ask your pharmacist or care team how to get rid of this medication safely. NOTE: This sheet is a summary. It may not cover all possible information. If you have questions about this medicine, talk to your doctor, pharmacist, or health care provider.  2024 Elsevier/Gold Standard (2021-04-27 00:00:00)

## 2023-01-02 ENCOUNTER — Ambulatory Visit (HOSPITAL_COMMUNITY): Payer: BLUE CROSS/BLUE SHIELD | Attending: Podiatry | Admitting: Physical Therapy

## 2023-01-02 ENCOUNTER — Other Ambulatory Visit: Payer: Self-pay

## 2023-01-02 DIAGNOSIS — M79672 Pain in left foot: Secondary | ICD-10-CM | POA: Diagnosis present

## 2023-01-02 DIAGNOSIS — M722 Plantar fascial fibromatosis: Secondary | ICD-10-CM | POA: Insufficient documentation

## 2023-01-02 DIAGNOSIS — M79671 Pain in right foot: Secondary | ICD-10-CM | POA: Diagnosis present

## 2023-01-02 LAB — CBC WITH DIFFERENTIAL/PLATELET
Basophils Absolute: 0.1 10*3/uL (ref 0.0–0.2)
Basos: 1 %
EOS (ABSOLUTE): 0.1 10*3/uL (ref 0.0–0.4)
Eos: 2 %
Hematocrit: 41.1 % (ref 34.0–46.6)
Hemoglobin: 13.7 g/dL (ref 11.1–15.9)
Immature Grans (Abs): 0 10*3/uL (ref 0.0–0.1)
Immature Granulocytes: 0 %
Lymphocytes Absolute: 1.7 10*3/uL (ref 0.7–3.1)
Lymphs: 31 %
MCH: 33 pg (ref 26.6–33.0)
MCHC: 33.3 g/dL (ref 31.5–35.7)
MCV: 99 fL — ABNORMAL HIGH (ref 79–97)
Monocytes Absolute: 0.5 10*3/uL (ref 0.1–0.9)
Monocytes: 8 %
Neutrophils Absolute: 3.2 10*3/uL (ref 1.4–7.0)
Neutrophils: 58 %
Platelets: 331 10*3/uL (ref 150–450)
RBC: 4.15 x10E6/uL (ref 3.77–5.28)
RDW: 12 % (ref 11.7–15.4)
WBC: 5.5 10*3/uL (ref 3.4–10.8)

## 2023-01-02 LAB — COMPREHENSIVE METABOLIC PANEL
ALT: 15 [IU]/L (ref 0–32)
AST: 18 [IU]/L (ref 0–40)
Albumin: 4.5 g/dL (ref 3.8–4.9)
Alkaline Phosphatase: 85 [IU]/L (ref 44–121)
BUN/Creatinine Ratio: 11 — ABNORMAL LOW (ref 12–28)
BUN: 8 mg/dL (ref 8–27)
Bilirubin Total: <0.2 mg/dL (ref 0.0–1.2)
CO2: 25 mmol/L (ref 20–29)
Calcium: 9.7 mg/dL (ref 8.7–10.3)
Chloride: 103 mmol/L (ref 96–106)
Creatinine, Ser: 0.74 mg/dL (ref 0.57–1.00)
Globulin, Total: 1.9 g/dL (ref 1.5–4.5)
Glucose: 73 mg/dL (ref 70–99)
Potassium: 4.4 mmol/L (ref 3.5–5.2)
Sodium: 140 mmol/L (ref 134–144)
Total Protein: 6.4 g/dL (ref 6.0–8.5)
eGFR: 93 mL/min/{1.73_m2} (ref >59)

## 2023-01-02 LAB — TSH RFX ON ABNORMAL TO FREE T4: TSH: 2.34 u[IU]/mL (ref 0.450–4.500)

## 2023-01-02 NOTE — Therapy (Addendum)
Daily Note   Patient Name: Brenda King MRN: [id number] DOB:Oct 31, 1960, 62 y.o., female Today's Date: 01/02/2023 Progress Note Reporting Period 10/18/2022 to 01/02/23  See note below for Objective Data and Assessment of Progress/Goals.      END OF SESSION:  PT End of Session - 01/02/23 1443     Visit Number 9    Number of Visits 21    Date for PT Re-Evaluation 02/13/23    Authorization Type BC[employer]    PT Start Time 1350    PT Stop Time 1433    PT Time Calculation (min) 43 min    Activity Tolerance Patient tolerated treatment well    Behavior During Therapy [facility] for tasks assessed/performed                Past Medical History:  Diagnosis Date   Anxiety    Atrial fibrillation (HCC)    Status post ablation 2004 and 2005 - Dr. Sampson Goon [facility])   Constipation    Degenerative disc disease, cervical    Menopause    Nodular infiltrative basal cell carcinoma (BCC) 07/04/2021   Left Shoulder   Past Surgical History:  Procedure Laterality Date   ABDOMINAL HYSTERECTOMY     BREAST ENHANCEMENT SURGERY  2001    CARDIAC ELECTROPHYSIOLOGY MAPPING AND ABLATION     CYSTOSCOPY W/ RETROGRADES Bilateral 04/17/2016   Procedure: CYSTOSCOPY WITH BILATERAL RETROGRADE PYELOGRAMS;  Surgeon: Malen Gauze, MD;  Location: [facility];  Service: Urology;  Laterality: Bilateral;   PARTIAL HYSTERECTOMY  2000    Patient Active Problem List   Diagnosis Date Noted   Midline low back pain without sciatica 10/02/2022   Migraine without aura and without status migrainosus, not intractable 06/03/2017   Chronic idiopathic constipation 02/04/2013   Allergic rhinitis 07/09/2012    PCP: Tommie Sams, DO  REFERRING PROVIDER: Pilar Plate, DPM  REFERRING DIAG: Plantar fasciitis, left [M72.2], Plantar fasciitis of right foot [M72.2]   THERAPY DIAG:  Pain in right foot - Plan: PT plan of care cert/re-cert  Pain in left foot - Plan: PT plan of care cert/re-cert  Rationale for  Evaluation and Treatment: Rehabilitation  ONSET DATE: 2 years ago  SUBJECTIVE:   SUBJECTIVE STATEMENT PT states that she has been going to therapy down in [place] however she was unable to go in September/early October due to having terrible migraines.  She is ready to resume therapy but she did not want to travel all the way to [place] as she lives here in [place].   She is much better but she has not been on her feet for about 4 weeks due to her migraines.  Able to stand for 30 minutes.    PERTINENT HISTORY: PMHx includes: anxiety, atrial fibrillation, migraines Patient reporting hx of Rt plantar fascia procedure and Rt bunionectomy procedure in August 2022.   PAIN:  Are you having pain? Yes: NPRS scale: Rt foot 4/10, Lt foot 2/10- max RT 4/10  Pain location: bilateral foot pain (medial plantar and heel pain)   Pain description: throbbing  Aggravating factors: weight bearing activities  Relieving factors: not identified at eval   PRECAUTIONS: None  WEIGHT BEARING RESTRICTIONS: No  FALLS:  Has patient fallen in last 6 months? Yes. Number of falls "tripped at work"   LIVING ENVIRONMENT: Lives with: lives with their spouse Lives in: House/apartment Stairs: Yes: External: 2 steps; none Has following equipment at home: None  OCCUPATION: works for [employer]   PLOF: Independent with  basic ADLs, Independent with gait, and Vocation/Vocational requirements: Standing/walking greater than 8 hours  PATIENT GOALS: To decrease pain, improve standing and walking tolerance in order to return to work, conservative management in order to avoid surgical intervention  NEXT MD VISIT: 02/03/23 OBJECTIVE:   DIAGNOSTIC FINDINGS:  07/11/22 BIL Foot x-ray perform; results in Dr. Lenn Sink, DPM office note from same day.   X-rays indicate moderate depression of the arch no other indications of pathology first MPJ which was fixed several years ago appears stable and  looks good   PATIENT SURVEYS:  FOTO 65 current, 71 predicted   COGNITION: Overall cognitive status: Within functional limits for tasks assessed    PALPATION: BIL moderate-to-severe tenderness to palpation along plantar fascia, medial longitudinal arch, medial heel, and medial heel.  She has palpable plantar fibroma with severe tenderness to palpation bilaterally.  Restricted mobility with manual plantar fascial stretching.  LOWER EXTREMITY ROM: to be formally assessed at follow up   Active ROM Right 10/17 Left 10/17  Hip flexion    Hip extension    Hip abduction    Hip adduction    Hip internal rotation    Hip external rotation    Knee flexion    Knee extension    Ankle dorsiflexion 8 4  Ankle plantarflexion Wfl  Wfl   Ankle inversion    Ankle eversion     (Blank rows = not tested)  LOWER EXTREMITY MMT: To be formally assessed at follow-up  MMT Right 01/02/23 Left 01/02/23  Hip flexion 4- 3+  Hip extension 4- 3+  Hip abduction 3+ 3+  Hip adduction    Hip internal rotation    Hip external rotation    Knee flexion 3+ 3+  Knee extension 3+ 3+  Ankle dorsiflexion 3+ 3+  Ankle plantarflexion    Ankle inversion    Ankle eversion     (Blank rows = not tested) 01/02/23 Functional testing: Sit to stand 30 seconds: 12 x; 14 is average for pt  Single leg stance:  RT:  60";  , LT:  2 minute walk test:  next treatment.    TODAY'S TREATMENT:                                                                                                                              01/02/23 Reassessment see above: Toe stretch 30" x 3 B  Sit to stand x 10 SLS 60" each    [facility] Adult PT Treatment:              11/22/22  Therapeutic Exercise: NuStep level 6 x 5 min for warm up Towel scruches - 5# Marble pick up x 2   Therapeutic Activity:  Subjective assessment regarding progress towards established goals and plan for independence ongoing skilled PT through remaining POC   [facility]  Adult PT Treatment:              11/20/22  Therapeutic Exercise: NuStep level 6 x 5 min for warm up Towel scruches - 5# Marble pick up x 2  Slant board stretch, 3 x 30 sec  Heel raises with heel hang on foam x 20 Lateral walks on airex beam with heel hang x 2 laps  Foot "peeling" raises and lowering x 10 each  PF stretch with sheet SLS on foam, 2 x 30 sec each side     PATIENT EDUCATION:  Education details: reviewed initial home exercise program; discussion of POC, prognosis and goals for skilled PT; encouraged use of cold pack/frozen water bottle for pain management.  Discussed HEP after prolonged sitting and laying down for symptom management upon weightbearing Person educated: Patient Education method: Explanation, Demonstration, and Handouts Education comprehension: verbalized understanding, returned demonstration, and needs further education  HOME EXERCISE PROGRAM: Access Code: [email or web address] URL: [email or web address] Date: 11/06/2022 Prepared by: Alphonzo Severance  Exercises - Heel Raises with Counter Support  - 1 x daily - 7 x weekly - 3 sets - 10 reps - Long Sitting Plantar Fascia Stretch with Towel  - 3 x daily - 7 x weekly - 1 sets - 2 reps - 2 minutes hold - Seated Toe Towel Scrunches  - 1 x daily - 7 x weekly - 3 sets - 10 reps - Seated Marble Pick-Up with Toes  - 1 x daily - 7 x weekly - 3 sets - 10 reps 01/02/23 - Seated Plantar Fascia Stretch  - 2 x daily - 7 x weekly - 1 sets - 3 reps - 30" hold - Plantar Fascia Stretch on Step  - 2 x daily - 7 x weekly - 1 sets - 3 reps - 30" hold ASSESSMENT:  CLINICAL IMPRESSION:Pt has been being seen in [facility] for B plantar fascitis from 10/18/22 -11/22/22.  She then began having severe migraines over the next month and was unable to continue her treatment.  Her migraines are under control at this time but she does not want to drive to [facility] and requested to have treatment here.  The pt states that she has basically  been bed ridden so her feet feel much better, however, her whole body feels weak and she is concerned how her feet are going to do as she increases her activity level.  Ms. Winburn demonstrated decreased activity tolerance, decreased ROM, decreased strength and increased pain.  She will benefit from skilled PT>      OBJECTIVE IMPAIRMENTS: Abnormal gait, decreased activity tolerance, decreased endurance, decreased mobility, decreased strength, impaired flexibility, and pain.   ACTIVITY LIMITATIONS: carrying, lifting, standing, stairs, transfers, and locomotion level  PARTICIPATION LIMITATIONS: meal prep, cleaning, driving, shopping, community activity, and occupation   PERSONAL FACTORS: Past/current experiences, Profession, Time since onset of injury/illness/exacerbation, and 1-2 comorbidities: PMHx includes: anxiety, atrial fibrillation, migraines  are also affecting patient's functional outcome.   REHAB POTENTIAL: Fair    CLINICAL DECISION MAKING: Evolving/moderate complexity  EVALUATION COMPLEXITY: Moderate   GOALS: Goals reviewed with patient? Yes  SHORT TERM GOALS: Target date: 11/15/2022   Patient will be independent with initial home program for goal foot mobility, management of plantar fifth symptoms.  Baseline: Initiated at eval  Goal status: Met  2.  Patient will report ability to tolerate standing for 10 to 15 minutes in order to perform normal ADLs/IADLs without exacerbation of symptoms.  Baseline: unable  Goal status: Nearly met   LONG TERM GOALS: Target date: 12/13/2022   Patient will report improved overall functional ability with FOTO score  of 70 or greater.  Baseline: 65 11/22/22: 60 Goal status: ONGOING   2.  Patient will report ability to tolerate standing for 30 minutes to an hour without significant exacerbation of symptoms. Baseline: Unable to tolerate 11/22/22: "Was able to cook for 1 hour with some seated breaks."  Goal status: ONGOING   3.  Patient will  report no pain while driving up to 30 minutes in order to perform normal household errands and commute to work. Baseline: Unable to tolerate greater than 5 to 10 minutes 11/22/22: no problems Goal status: ONGOING   4.  Patient will demonstrate ability to tolerate uneven surfaces, and incline surfaces without significant exacerbation of pain for at least 10 minutes or greater. Baseline: Unable to tolerate 11/22/22: some discomfort/irritation  Goal status: ONGOING   5.  Patient will be independent with self-management including modalities as indicated and use of prescribed home program.   Baseline: Initiated at evaluation Goal status: ONGOING     PLAN:  PT FREQUENCY: 2x/week  PT DURATION: 8 weeks 01/02/23 continue for 6 more weeks.   PLANNED INTERVENTIONS: Therapeutic exercises, Therapeutic activity, Neuromuscular re-education, Balance training, Gait training, Patient/Family education, Self Care, Joint mobilization, Stair training, Aquatic Therapy, Dry Needling, Electrical stimulation, Cryotherapy, Moist heat, Taping, Manual therapy, and Re-evaluation  PLAN FOR NEXT SESSION: strengthening/flexibility and  Manual therapy as indicated, pain modulation activities.  Patient education related to self-management.   Virgina Organ, PT CLT [phone]  01/02/2023, 2:43 PM

## 2023-01-04 ENCOUNTER — Encounter: Payer: Self-pay | Admitting: Neurology

## 2023-01-04 DIAGNOSIS — R519 Headache, unspecified: Secondary | ICD-10-CM | POA: Insufficient documentation

## 2023-01-04 DIAGNOSIS — G43101 Migraine with aura, not intractable, with status migrainosus: Secondary | ICD-10-CM | POA: Insufficient documentation

## 2023-01-04 MED ORDER — NURTEC 75 MG PO TBDP: 75.0000 mg | ORAL_TABLET | Freq: Every day | ORAL | 6 refills | Status: DC | PRN

## 2023-01-04 MED ORDER — QULIPTA 60 MG PO TABS: 60.0000 mg | ORAL_TABLET | Freq: Every day | ORAL | 11 refills | Status: DC

## 2023-01-08 ENCOUNTER — Ambulatory Visit (HOSPITAL_COMMUNITY): Payer: BLUE CROSS/BLUE SHIELD | Admitting: Physical Therapy

## 2023-01-08 DIAGNOSIS — M79672 Pain in left foot: Secondary | ICD-10-CM

## 2023-01-08 DIAGNOSIS — M79671 Pain in right foot: Secondary | ICD-10-CM

## 2023-01-08 NOTE — Therapy (Signed)
Physical Therapy Treatment Note   Patient Name: Brenda King MRN: [id number] DOB:12/31/1960, 62 y.o., female Today's Date: 01/08/2023  END OF SESSION:  PT End of Session - 01/08/23 1403     Visit Number 10    Number of Visits 21    Date for PT Re-Evaluation 02/13/23    Authorization Type BCBS    PT Start Time 1348    PT Stop Time 1426    PT Time Calculation (min) 38 min    Activity Tolerance Patient tolerated treatment well    Behavior During Therapy [facility] for tasks assessed/performed                Past Medical History:  Diagnosis Date   Anxiety    Atrial fibrillation (HCC)    Status post ablation 2004 and 2005 - Dr. Sampson Goon [facility])   Constipation    Degenerative disc disease, cervical    Menopause    Nodular infiltrative basal cell carcinoma (BCC) 07/04/2021   Left Shoulder   Past Surgical History:  Procedure Laterality Date   ABDOMINAL HYSTERECTOMY     BREAST ENHANCEMENT SURGERY  2001    CARDIAC ELECTROPHYSIOLOGY MAPPING AND ABLATION     CYSTOSCOPY W/ RETROGRADES Bilateral 04/17/2016   Procedure: CYSTOSCOPY WITH BILATERAL RETROGRADE PYELOGRAMS;  Surgeon: Malen Gauze, MD;  Location: [facility];  Service: Urology;  Laterality: Bilateral;   PARTIAL HYSTERECTOMY  2000    Patient Active Problem List   Diagnosis Date Noted   Migraine with aura and with status migrainosus, not intractable 01/04/2023   Worsening headaches 01/04/2023   Midline low back pain without sciatica 10/02/2022   Migraine without aura and without status migrainosus, not intractable 06/03/2017   Chronic idiopathic constipation 02/04/2013   Allergic rhinitis 07/09/2012    PCP: Tommie Sams, DO  REFERRING PROVIDER: Pilar Plate, DPM  REFERRING DIAG: Plantar fasciitis, left [M72.2], Plantar fasciitis of right foot [M72.2]   THERAPY DIAG:  Pain in right foot  Pain in left foot  Rationale for Evaluation and Treatment: Rehabilitation  ONSET DATE: 2 years  ago  SUBJECTIVE:   SUBJECTIVE STATEMENT Pt states she has Ledderhose disease (plantar fibromatosis) where there are painful fibromas in her plantar fascia bilaterally.  Pt states nothing is working and is desperate to relieve her symptoms.   PT states that she has been going to therapy down in [place] however she was unable to go in September/early October due to having terrible migraines.  She is ready to resume therapy but she did not want to travel all the way to [place] as she lives here in [place].   She is much better but she has not been on her feet for about 4 weeks due to her migraines.  Able to stand for 30 minutes.    PERTINENT HISTORY: PMHx includes: anxiety, atrial fibrillation, migraines Patient reporting hx of Rt plantar fascia procedure and Rt bunionectomy procedure in August 2022.   PAIN:  Are you having pain? Yes: NPRS scale: Rt foot 4/10, Lt foot 2/10- max RT 4/10  Pain location: bilateral foot pain (medial plantar and heel pain)   Pain description: throbbing  Aggravating factors: weight bearing activities  Relieving factors: not identified at eval   PRECAUTIONS: None  WEIGHT BEARING RESTRICTIONS: No  FALLS:  Has patient fallen in last 6 months? Yes. Number of falls "tripped at work"   LIVING ENVIRONMENT: Lives with: lives with their spouse Lives in: House/apartment Stairs: Yes: External: 2 steps; none Has following  equipment at home: None  OCCUPATION: works for [employer]   PLOF: Independent with basic ADLs, Independent with gait, and Vocation/Vocational requirements: Standing/walking greater than 8 hours  PATIENT GOALS: To decrease pain, improve standing and walking tolerance in order to return to work, conservative management in order to avoid surgical intervention  NEXT MD VISIT: 02/03/23 OBJECTIVE:   DIAGNOSTIC FINDINGS:  07/11/22 BIL Foot x-ray perform; results in Dr. Lenn Sink, DPM office note from same day.   X-rays  indicate moderate depression of the arch no other indications of pathology first MPJ which was fixed several years ago appears stable and looks good   PATIENT SURVEYS:  FOTO 65 current, 71 predicted   COGNITION: Overall cognitive status: Within functional limits for tasks assessed    PALPATION: BIL moderate-to-severe tenderness to palpation along plantar fascia, medial longitudinal arch, medial heel, and medial heel.  She has palpable plantar fibroma with severe tenderness to palpation bilaterally.  Restricted mobility with manual plantar fascial stretching.  LOWER EXTREMITY ROM: to be formally assessed at follow up   Active ROM Right 10/17 Left 10/17  Hip flexion    Hip extension    Hip abduction    Hip adduction    Hip internal rotation    Hip external rotation    Knee flexion    Knee extension    Ankle dorsiflexion 8 4  Ankle plantarflexion Wfl  Wfl   Ankle inversion    Ankle eversion     (Blank rows = not tested)  LOWER EXTREMITY MMT: To be formally assessed at follow-up  MMT Right 01/02/23 Left 01/02/23  Hip flexion 4- 3+  Hip extension 4- 3+  Hip abduction 3+ 3+  Hip adduction    Hip internal rotation    Hip external rotation    Knee flexion 3+ 3+  Knee extension 3+ 3+  Ankle dorsiflexion 3+ 3+  Ankle plantarflexion    Ankle inversion    Ankle eversion     (Blank rows = not tested) 01/02/23 Functional testing: Sit to stand 30 seconds: 12 x; 14 is average for pt  Single leg stance:  RT:  60";  , LT:  2 minute walk test:  next treatment. (500 feet on 01/08/23)   TODAY'S TREATMENT:                                                                                                                              01/08/23 500 feet no AD Standing:  slant board stretch gastroc 3X30"  Slant board stretch soleus 3X30"  Heel raises 20X on incline  Toeraises 20X on decline   01/02/23 Reassessment see above: Toe stretch 30" x 3 B  Sit to stand x 10 SLS  60" each    [facility] Adult PT Treatment:              11/22/22  Therapeutic Exercise: NuStep level 6 x 5 min for warm up Towel  scruches - 5# Marble pick up x 2   Therapeutic Activity:  Subjective assessment regarding progress towards established goals and plan for independence ongoing skilled PT through remaining POC   [facility] Adult PT Treatment:              11/20/22  Therapeutic Exercise: NuStep level 6 x 5 min for warm up Towel scruches - 5# Marble pick up x 2  Slant board stretch, 3 x 30 sec  Heel raises with heel hang on foam x 20 Lateral walks on airex beam with heel hang x 2 laps  Foot "peeling" raises and lowering x 10 each  PF stretch with sheet SLS on foam, 2 x 30 sec each side     PATIENT EDUCATION:  Education details: reviewed initial home exercise program; discussion of POC, prognosis and goals for skilled PT; encouraged use of cold pack/frozen water bottle for pain management.  Discussed HEP after prolonged sitting and laying down for symptom management upon weightbearing Person educated: Patient Education method: Explanation, Demonstration, and Handouts Education comprehension: verbalized understanding, returned demonstration, and needs further education  HOME EXERCISE PROGRAM: Access Code: [email or web address] URL: [email or web address] Date: 11/06/2022 Prepared by: Alphonzo Severance  Exercises - Heel Raises with Counter Support  - 1 x daily - 7 x weekly - 3 sets - 10 reps - Long Sitting Plantar Fascia Stretch with Towel  - 3 x daily - 7 x weekly - 1 sets - 2 reps - 2 minutes hold - Seated Toe Towel Scrunches  - 1 x daily - 7 x weekly - 3 sets - 10 reps - Seated Marble Pick-Up with Toes  - 1 x daily - 7 x weekly - 3 sets - 10 reps 01/02/23 - Seated Plantar Fascia Stretch  - 2 x daily - 7 x weekly - 1 sets - 3 reps - 30" hold - Plantar Fascia Stretch on Step  - 2 x daily - 7 x weekly - 1 sets - 3 reps - 30" hold ASSESSMENT:  CLINICAL IMPRESSION: Began session  with 2 MWT with ability to complete 500 feet in this time.  Pt reports some improvement with rest, however fearful of return when goes back to work.   Pt with questions regarding dry needling; will discuss with evaluating therapist to see if this may be beneficial.  Continued with stretches and strengthening for LE's.  Pt will continue to benefit from skilled PTto address deficits.     OBJECTIVE IMPAIRMENTS: Abnormal gait, decreased activity tolerance, decreased endurance, decreased mobility, decreased strength, impaired flexibility, and pain.   ACTIVITY LIMITATIONS: carrying, lifting, standing, stairs, transfers, and locomotion level  PARTICIPATION LIMITATIONS: meal prep, cleaning, driving, shopping, community activity, and occupation   PERSONAL FACTORS: Past/current experiences, Profession, Time since onset of injury/illness/exacerbation, and 1-2 comorbidities: PMHx includes: anxiety, atrial fibrillation, migraines  are also affecting patient's functional outcome.   REHAB POTENTIAL: Fair    CLINICAL DECISION MAKING: Evolving/moderate complexity  EVALUATION COMPLEXITY: Moderate   GOALS: Goals reviewed with patient? Yes  SHORT TERM GOALS: Target date: 11/15/2022   Patient will be independent with initial home program for goal foot mobility, management of plantar fifth symptoms.  Baseline: Initiated at eval  Goal status: Met  2.  Patient will report ability to tolerate standing for 10 to 15 minutes in order to perform normal ADLs/IADLs without exacerbation of symptoms.  Baseline: unable  Goal status: Nearly met   LONG TERM GOALS: Target date: 12/13/2022   Patient will report improved overall functional ability  with FOTO score of 70 or greater.  Baseline: 65 11/22/22: 60 Goal status: ONGOING   2.  Patient will report ability to tolerate standing for 30 minutes to an hour without significant exacerbation of symptoms. Baseline: Unable to tolerate 11/22/22: "Was able to cook for 1  hour with some seated breaks."  Goal status: ONGOING   3.  Patient will report no pain while driving up to 30 minutes in order to perform normal household errands and commute to work. Baseline: Unable to tolerate greater than 5 to 10 minutes 11/22/22: no problems Goal status: ONGOING   4.  Patient will demonstrate ability to tolerate uneven surfaces, and incline surfaces without significant exacerbation of pain for at least 10 minutes or greater. Baseline: Unable to tolerate 11/22/22: some discomfort/irritation  Goal status: ONGOING   5.  Patient will be independent with self-management including modalities as indicated and use of prescribed home program.   Baseline: Initiated at evaluation Goal status: ONGOING     PLAN:  PT FREQUENCY: 2x/week  PT DURATION: 8 weeks 01/02/23 continue for 6 more weeks.   PLANNED INTERVENTIONS: Therapeutic exercises, Therapeutic activity, Neuromuscular re-education, Balance training, Gait training, Patient/Family education, Self Care, Joint mobilization, Stair training, Aquatic Therapy, Dry Needling, Electrical stimulation, Cryotherapy, Moist heat, Taping, Manual therapy, and Re-evaluation  PLAN FOR NEXT SESSION: strengthening/flexibility and  Manual therapy as indicated, pain modulation activities.  Patient education related to self-management.  Lurena Nida, PTA/CLT [facility] Ph: [phone]  01/08/2023, 2:04 PM

## 2023-01-10 ENCOUNTER — Ambulatory Visit (HOSPITAL_COMMUNITY): Payer: BLUE CROSS/BLUE SHIELD

## 2023-01-10 ENCOUNTER — Encounter (HOSPITAL_COMMUNITY): Payer: Self-pay

## 2023-01-10 DIAGNOSIS — M79672 Pain in left foot: Secondary | ICD-10-CM

## 2023-01-10 DIAGNOSIS — M79671 Pain in right foot: Secondary | ICD-10-CM

## 2023-01-10 NOTE — Therapy (Signed)
Physical Therapy Treatment Note   Patient Name: Brenda King MRN: [id number] DOB:March 25, 1960, 62 y.o., female Today's Date: 01/10/2023  END OF SESSION:  PT End of Session - 01/10/23 1113     Visit Number 11    Number of Visits 21    Date for PT Re-Evaluation 02/13/23    Authorization Type BCBS    PT Start Time 1116    PT Stop Time 1200    PT Time Calculation (min) 44 min    Activity Tolerance Patient tolerated treatment well    Behavior During Therapy [facility] for tasks assessed/performed                Past Medical History:  Diagnosis Date   Anxiety    Atrial fibrillation (HCC)    Status post ablation 2004 and 2005 - Dr. Sampson Goon [facility])   Constipation    Degenerative disc disease, cervical    Menopause    Nodular infiltrative basal cell carcinoma (BCC) 07/04/2021   Left Shoulder   Past Surgical History:  Procedure Laterality Date   ABDOMINAL HYSTERECTOMY     BREAST ENHANCEMENT SURGERY  2001    CARDIAC ELECTROPHYSIOLOGY MAPPING AND ABLATION     CYSTOSCOPY W/ RETROGRADES Bilateral 04/17/2016   Procedure: CYSTOSCOPY WITH BILATERAL RETROGRADE PYELOGRAMS;  Surgeon: Malen Gauze, MD;  Location: [facility];  Service: Urology;  Laterality: Bilateral;   PARTIAL HYSTERECTOMY  2000    Patient Active Problem List   Diagnosis Date Noted   Migraine with aura and with status migrainosus, not intractable 01/04/2023   Worsening headaches 01/04/2023   Midline low back pain without sciatica 10/02/2022   Migraine without aura and without status migrainosus, not intractable 06/03/2017   Chronic idiopathic constipation 02/04/2013   Allergic rhinitis 07/09/2012    PCP: Tommie Sams, DO  REFERRING PROVIDER: Pilar Plate, DPM  REFERRING DIAG: Plantar fasciitis, left [M72.2], Plantar fasciitis of right foot [M72.2]   THERAPY DIAG:  Pain in right foot  Pain in left foot  Rationale for Evaluation and Treatment: Rehabilitation  ONSET DATE: 2 years  ago  SUBJECTIVE:   SUBJECTIVE STATEMENT Pt stated she went to concert last night and some increased pain both heels. Reports compliance with HEP, able to complete 14 STS in 30" stated difficult but able to achieve.     Eval:  PT states that she has been going to therapy down in [place] however she was unable to go in September/early October due to having terrible migraines.  She is ready to resume therapy but she did not want to travel all the way to [place] as she lives here in [place].   She is much better but she has not been on her feet for about 4 weeks due to her migraines.  Able to stand for 30 minutes.    PERTINENT HISTORY: PMHx includes: anxiety, atrial fibrillation, migraines Patient reporting hx of Rt plantar fascia procedure and Rt bunionectomy procedure in August 2022.   PAIN:  Are you having pain? Yes: NPRS scale: bil foot 3/10 Pain location: bilateral foot pain (medial plantar and heel pain)   Pain description: throbbing  Aggravating factors: weight bearing activities  Relieving factors: not identified at eval   PRECAUTIONS: None  WEIGHT BEARING RESTRICTIONS: No  FALLS:  Has patient fallen in last 6 months? Yes. Number of falls "tripped at work"   LIVING ENVIRONMENT: Lives with: lives with their spouse Lives in: House/apartment Stairs: Yes: External: 2 steps; none Has following equipment at home:  None  OCCUPATION: works for [employer]   PLOF: Independent with basic ADLs, Independent with gait, and Vocation/Vocational requirements: Standing/walking greater than 8 hours  PATIENT GOALS: To decrease pain, improve standing and walking tolerance in order to return to work, conservative management in order to avoid surgical intervention  NEXT MD VISIT: 02/03/23 OBJECTIVE:   DIAGNOSTIC FINDINGS:  07/11/22 BIL Foot x-ray perform; results in Dr. Lenn Sink, DPM office note from same day.   X-rays indicate moderate depression of the arch  no other indications of pathology first MPJ which was fixed several years ago appears stable and looks good   PATIENT SURVEYS:  FOTO 65 current, 71 predicted   COGNITION: Overall cognitive status: Within functional limits for tasks assessed    PALPATION: BIL moderate-to-severe tenderness to palpation along plantar fascia, medial longitudinal arch, medial heel, and medial heel.  She has palpable plantar fibroma with severe tenderness to palpation bilaterally.  Restricted mobility with manual plantar fascial stretching.  LOWER EXTREMITY ROM: to be formally assessed at follow up   Active ROM Right 10/17 Left 10/17  Hip flexion    Hip extension    Hip abduction    Hip adduction    Hip internal rotation    Hip external rotation    Knee flexion    Knee extension    Ankle dorsiflexion 8 4  Ankle plantarflexion Wfl  Wfl   Ankle inversion    Ankle eversion     (Blank rows = not tested)  LOWER EXTREMITY MMT: To be formally assessed at follow-up  MMT Right 01/02/23 Left 01/02/23  Hip flexion 4- 3+  Hip extension 4- 3+  Hip abduction 3+ 3+  Hip adduction    Hip internal rotation    Hip external rotation    Knee flexion 3+ 3+  Knee extension 3+ 3+  Ankle dorsiflexion 3+ 3+  Ankle plantarflexion    Ankle inversion    Ankle eversion     (Blank rows = not tested) 01/02/23 Functional testing: Sit to stand 30 seconds: 12 x; 14 is average for pt  Single leg stance:  RT:  60";  , LT:  2 minute walk test:  next treatment. (500 feet on 01/08/23)   TODAY'S TREATMENT:                                                                                                                              01/10/23: Standing:  Heel raises 20X on incline  Toeraises 20X on decline  Vector stance 3x 5"  Tandem stance on foam 2x 30"  Plantar fascia stretch 2x 30" on step  Squat front of chair 2x 10  Slant board  2x 30"  01/08/23 500 feet no AD Standing:  slant board stretch gastroc  3X30"  Slant board stretch soleus 3X30"  Heel raises 20X on incline  Toeraises 20X on decline   01/02/23 Reassessment see above: Toe stretch 30" x 3  B  Sit to stand x 10 SLS 60" each    [facility] Adult PT Treatment:              11/22/22  Therapeutic Exercise: NuStep level 6 x 5 min for warm up Towel scruches - 5# Marble pick up x 2   Therapeutic Activity:  Subjective assessment regarding progress towards established goals and plan for independence ongoing skilled PT through remaining POC   [facility] Adult PT Treatment:              11/20/22  Therapeutic Exercise: NuStep level 6 x 5 min for warm up Towel scruches - 5# Marble pick up x 2  Slant board stretch, 3 x 30 sec  Heel raises with heel hang on foam x 20 Lateral walks on airex beam with heel hang x 2 laps  Foot "peeling" raises and lowering x 10 each  PF stretch with sheet SLS on foam, 2 x 30 sec each side     PATIENT EDUCATION:  Education details: reviewed initial home exercise program; discussion of POC, prognosis and goals for skilled PT; encouraged use of cold pack/frozen water bottle for pain management.  Discussed HEP after prolonged sitting and laying down for symptom management upon weightbearing Person educated: Patient Education method: Explanation, Demonstration, and Handouts Education comprehension: verbalized understanding, returned demonstration, and needs further education  HOME EXERCISE PROGRAM: Access Code: [email or web address] URL: [email or web address] Date: 11/06/2022 Prepared by: Alphonzo Severance  Exercises - Heel Raises with Counter Support  - 1 x daily - 7 x weekly - 3 sets - 10 reps - Long Sitting Plantar Fascia Stretch with Towel  - 3 x daily - 7 x weekly - 1 sets - 2 reps - 2 minutes hold - Seated Toe Towel Scrunches  - 1 x daily - 7 x weekly - 3 sets - 10 reps - Seated Marble Pick-Up with Toes  - 1 x daily - 7 x weekly - 3 sets - 10 reps 01/02/23 - Seated Plantar Fascia Stretch  - 2 x  daily - 7 x weekly - 1 sets - 3 reps - 30" hold - Plantar Fascia Stretch on Step  - 2 x daily - 7 x weekly - 1 sets - 3 reps - 30" hold 01/10/23: - Standing 3-Way Kick  - 1-2 x daily - 7 x weekly - 1 sets - 5 reps - 5" hold - Squat with Chair Touch  - 1-2 x daily - 7 x weekly - 2 sets - 10 reps - Gastroc Stretch on Wall  - 2 x daily - 7 x weekly - 3 sets - 3 reps - 30" hold ASSESSMENT:  CLINICAL IMPRESSION: Session focus with strengthening and mobility.  Added hip strengthening exercises to POC that was tolerated well.  Stretches complete for pain control and mobility, added gastroc stretch against wall to HEP with printout given.  No reports of increased pain through session.   OBJECTIVE IMPAIRMENTS: Abnormal gait, decreased activity tolerance, decreased endurance, decreased mobility, decreased strength, impaired flexibility, and pain.   ACTIVITY LIMITATIONS: carrying, lifting, standing, stairs, transfers, and locomotion level  PARTICIPATION LIMITATIONS: meal prep, cleaning, driving, shopping, community activity, and occupation   PERSONAL FACTORS: Past/current experiences, Profession, Time since onset of injury/illness/exacerbation, and 1-2 comorbidities: PMHx includes: anxiety, atrial fibrillation, migraines  are also affecting patient's functional outcome.   REHAB POTENTIAL: Fair    CLINICAL DECISION MAKING: Evolving/moderate complexity  EVALUATION COMPLEXITY: Moderate   GOALS: Goals reviewed with patient? Yes  SHORT TERM  GOALS: Target date: 11/15/2022   Patient will be independent with initial home program for goal foot mobility, management of plantar fifth symptoms.  Baseline: Initiated at eval  Goal status: Met  2.  Patient will report ability to tolerate standing for 10 to 15 minutes in order to perform normal ADLs/IADLs without exacerbation of symptoms.  Baseline: unable  Goal status: Nearly met   LONG TERM GOALS: Target date: 12/13/2022   Patient will report  improved overall functional ability with FOTO score of 70 or greater.  Baseline: 65 11/22/22: 60 Goal status: ONGOING   2.  Patient will report ability to tolerate standing for 30 minutes to an hour without significant exacerbation of symptoms. Baseline: Unable to tolerate 11/22/22: "Was able to cook for 1 hour with some seated breaks."  Goal status: ONGOING   3.  Patient will report no pain while driving up to 30 minutes in order to perform normal household errands and commute to work. Baseline: Unable to tolerate greater than 5 to 10 minutes 11/22/22: no problems Goal status: ONGOING   4.  Patient will demonstrate ability to tolerate uneven surfaces, and incline surfaces without significant exacerbation of pain for at least 10 minutes or greater. Baseline: Unable to tolerate 11/22/22: some discomfort/irritation  Goal status: ONGOING   5.  Patient will be independent with self-management including modalities as indicated and use of prescribed home program.   Baseline: Initiated at evaluation Goal status: ONGOING     PLAN:  PT FREQUENCY: 2x/week  PT DURATION: 8 weeks 01/02/23 continue for 6 more weeks.   PLANNED INTERVENTIONS: Therapeutic exercises, Therapeutic activity, Neuromuscular re-education, Balance training, Gait training, Patient/Family education, Self Care, Joint mobilization, Stair training, Aquatic Therapy, Dry Needling, Electrical stimulation, Cryotherapy, Moist heat, Taping, Manual therapy, and Re-evaluation  PLAN FOR NEXT SESSION: strengthening/flexibility and  Manual therapy as indicated, pain modulation activities.  Patient education related to self-management.   Becky Sax, LPTA/CLT; CBIS [phone]  Juel Burrow, PTA 01/10/2023, 4:21 PM  01/10/2023, 4:21 PM

## 2023-01-14 ENCOUNTER — Ambulatory Visit (HOSPITAL_COMMUNITY): Payer: BLUE CROSS/BLUE SHIELD | Admitting: Physical Therapy

## 2023-01-14 DIAGNOSIS — M79671 Pain in right foot: Secondary | ICD-10-CM | POA: Diagnosis not present

## 2023-01-14 DIAGNOSIS — M79672 Pain in left foot: Secondary | ICD-10-CM

## 2023-01-14 NOTE — Therapy (Signed)
Physical Therapy Treatment Note   Patient Name: Brenda King MRN: [id number] DOB:[birth year], 62 y.o., female Today's Date: 01/14/2023  END OF SESSION:  PT End of Session - 01/14/23 1343     Visit Number 12    Number of Visits 21    Date for PT Re-Evaluation 02/13/23    Authorization Type BCBS    PT Start Time 1300    PT Stop Time 1343    PT Time Calculation (min) 43 min    Activity Tolerance Patient tolerated treatment well    Behavior During Therapy [facility] for tasks assessed/performed                 Past Medical History:  Diagnosis Date   Anxiety    Atrial fibrillation (HCC)    Status post ablation 2004 and 2005 - Dr. Sampson Goon [facility])   Constipation    Degenerative disc disease, cervical    Menopause    Nodular infiltrative basal cell carcinoma (BCC) 07/04/2021   Left Shoulder   Past Surgical History:  Procedure Laterality Date   ABDOMINAL HYSTERECTOMY     BREAST ENHANCEMENT SURGERY  2001    CARDIAC ELECTROPHYSIOLOGY MAPPING AND ABLATION     CYSTOSCOPY W/ RETROGRADES Bilateral 04/17/2016   Procedure: CYSTOSCOPY WITH BILATERAL RETROGRADE PYELOGRAMS;  Surgeon: Malen Gauze, MD;  Location: [facility];  Service: Urology;  Laterality: Bilateral;   PARTIAL HYSTERECTOMY  2000    Patient Active Problem List   Diagnosis Date Noted   Migraine with aura and with status migrainosus, not intractable 01/04/2023   Worsening headaches 01/04/2023   Midline low back pain without sciatica 10/02/2022   Migraine without aura and without status migrainosus, not intractable 06/03/2017   Chronic idiopathic constipation 02/04/2013   Allergic rhinitis 07/09/2012    PCP: Tommie Sams, DO  REFERRING PROVIDER: Pilar Plate, DPM  REFERRING DIAG: Plantar fasciitis, left [M72.2], Plantar fasciitis of right foot [M72.2]   THERAPY DIAG:  Pain in right foot  Pain in left foot  Rationale for Evaluation and Treatment: Rehabilitation  ONSET DATE: 2 years  ago  SUBJECTIVE:   SUBJECTIVE STATEMENT  Eval:  PT states that she has been going to therapy down in [place] however she was unable to go in September/early October due to having terrible migraines.  She is ready to resume therapy but she did not want to travel all the way to [place] as she lives here in [place].   She is much better but she has not been on her feet for about 4 weeks due to her migraines.  Able to stand for 30 minutes.    PERTINENT HISTORY: PMHx includes: anxiety, atrial fibrillation, migraines Patient reporting hx of Rt plantar fascia procedure and Rt bunionectomy procedure in August 2022.   PAIN:  Are you having pain? Yes: NPRS scale: bil foot 3/10 Pain location: bilateral foot pain (medial plantar and heel pain)   Pain description: throbbing  Aggravating factors: weight bearing activities  Relieving factors: not identified at eval   PRECAUTIONS: None  WEIGHT BEARING RESTRICTIONS: No  FALLS:  Has patient fallen in last 6 months? Yes. Number of falls "tripped at work"   LIVING ENVIRONMENT: Lives with: lives with their spouse Lives in: House/apartment Stairs: Yes: External: 2 steps; none Has following equipment at home: None  OCCUPATION: works for [employer]   PLOF: Independent with basic ADLs, Independent with gait, and Vocation/Vocational requirements: Standing/walking greater than 8 hours  PATIENT GOALS: To decrease pain, improve  standing and walking tolerance in order to return to work, conservative management in order to avoid surgical intervention  NEXT MD VISIT: 02/03/23 OBJECTIVE:   DIAGNOSTIC FINDINGS:  07/11/22 BIL Foot x-ray perform; results in Dr. Lenn Sink, DPM office note from same day.   X-rays indicate moderate depression of the arch no other indications of pathology first MPJ which was fixed several years ago appears stable and looks good   PATIENT SURVEYS:  FOTO 65 current, 71 predicted    COGNITION: Overall cognitive status: Within functional limits for tasks assessed    PALPATION: BIL moderate-to-severe tenderness to palpation along plantar fascia, medial longitudinal arch, medial heel, and medial heel.  She has palpable plantar fibroma with severe tenderness to palpation bilaterally.  Restricted mobility with manual plantar fascial stretching.  LOWER EXTREMITY ROM: to be formally assessed at follow up   Active ROM Right 10/17 Left 10/17  Hip flexion    Hip extension    Hip abduction    Hip adduction    Hip internal rotation    Hip external rotation    Knee flexion    Knee extension    Ankle dorsiflexion 8 4  Ankle plantarflexion Wfl  Wfl   Ankle inversion    Ankle eversion     (Blank rows = not tested)  LOWER EXTREMITY MMT: To be formally assessed at follow-up  MMT Right 01/02/23 Left 01/02/23  Hip flexion 4- 3+  Hip extension 4- 3+  Hip abduction 3+ 3+  Hip adduction    Hip internal rotation    Hip external rotation    Knee flexion 3+ 3+  Knee extension 3+ 3+  Ankle dorsiflexion 3+ 3+  Ankle plantarflexion    Ankle inversion    Ankle eversion     (Blank rows = not tested) 01/02/23 Functional testing: Sit to stand 30 seconds: 12 x; 14 is average for pt  Single leg stance:  RT:  60";  , LT:  2 minute walk test:  next treatment. (500 feet on 01/08/23)   TODAY'S TREATMENT:       01/14/23: Standing: Power step up 6" x 10 B with 3# wt. Goblet squat with 6# Side stepping with green theraband x 2 RT  Planatar fascia stretch 30" x 3 B Baps level 3 B for dorsi/plantar; in/eversion and rotation  Toe raise x 15  Gastroc stretch x 2 x 30"  Leg press 3 Pl x 10                                                                                                                          01/10/23: Standing:  Heel raises 20X on incline  Toeraises 20X on decline  Vector stance 3x 5"  Tandem stance on foam 2x 30"  Plantar fascia stretch 2x 30" on  step  Squat front of chair 2x 10  Slant board  2x 30"  01/08/23 500 feet no AD Standing:  slant board stretch gastroc  3X30"  Slant board stretch soleus 3X30"  Heel raises 20X on incline  Toeraises 20X on decline   01/02/23 Reassessment see above: Toe stretch 30" x 3 B  Sit to stand x 10 SLS 60" each     PATIENT EDUCATION:  Education details: reviewed initial home exercise program; discussion of POC, prognosis and goals for skilled PT; encouraged use of cold pack/frozen water bottle for pain management.  Discussed HEP after prolonged sitting and laying down for symptom management upon weightbearing Person educated: Patient Education method: Explanation, Demonstration, and Handouts Education comprehension: verbalized understanding, returned demonstration, and needs further education  HOME EXERCISE PROGRAM: Access Code: [email or web address] URL: [email or web address] Date: 11/06/2022 Prepared by: Alphonzo Severance  Exercises - Heel Raises with Counter Support  - 1 x daily - 7 x weekly - 3 sets - 10 reps - Long Sitting Plantar Fascia Stretch with Towel  - 3 x daily - 7 x weekly - 1 sets - 2 reps - 2 minutes hold - Seated Toe Towel Scrunches  - 1 x daily - 7 x weekly - 3 sets - 10 reps - Seated Marble Pick-Up with Toes  - 1 x daily - 7 x weekly - 3 sets - 10 reps 01/02/23 - Seated Plantar Fascia Stretch  - 2 x daily - 7 x weekly - 1 sets - 3 reps - 30" hold - Plantar Fascia Stretch on Step  - 2 x daily - 7 x weekly - 1 sets - 3 reps - 30" hold 01/10/23: - Standing 3-Way Kick  - 1-2 x daily - 7 x weekly - 1 sets - 5 reps - 5" hold - Squat with Chair Touch  - 1-2 x daily - 7 x weekly - 2 sets - 10 reps - Gastroc Stretch on Wall  - 2 x daily - 7 x weekly - 3 sets - 3 reps - 30" hold ASSESSMENT:  CLINICAL IMPRESSION:  Pt with significant soreness not so much pain.  Therapist added side stepping and leg press for strengthening; Baps for proprioception.    Pt continues to        have decreased strength, activity level and pain and will continue to benefit from skilled PT. Noted tightness of Plantar fascia.  PT will continue to benefit from skilled PT.   No reports of increased pain through session.   OBJECTIVE IMPAIRMENTS: Abnormal gait, decreased activity tolerance, decreased endurance, decreased mobility, decreased strength, impaired flexibility, and pain.   ACTIVITY LIMITATIONS: carrying, lifting, standing, stairs, transfers, and locomotion level  PARTICIPATION LIMITATIONS: meal prep, cleaning, driving, shopping, community activity, and occupation   PERSONAL FACTORS: Past/current experiences, Profession, Time since onset of injury/illness/exacerbation, and 1-2 comorbidities: PMHx includes: anxiety, atrial fibrillation, migraines  are also affecting patient's functional outcome.   REHAB POTENTIAL: Fair    CLINICAL DECISION MAKING: Evolving/moderate complexity  EVALUATION COMPLEXITY: Moderate   GOALS: Goals reviewed with patient? Yes  SHORT TERM GOALS: Target date: 11/15/2022   Patient will be independent with initial home program for goal foot mobility, management of plantar fifth symptoms.  Baseline: Initiated at eval  Goal status: Met  2.  Patient will report ability to tolerate standing for 10 to 15 minutes in order to perform normal ADLs/IADLs without exacerbation of symptoms.  Baseline: unable  Goal status: Nearly met   LONG TERM GOALS: Target date: 12/13/2022   Patient will report improved overall functional ability with FOTO score of 70 or greater.  Baseline: 65 11/22/22: 60 Goal status: ONGOING  2.  Patient will report ability to tolerate standing for 30 minutes to an hour without significant exacerbation of symptoms. Baseline: Unable to tolerate 11/22/22: "Was able to cook for 1 hour with some seated breaks."  Goal status: ONGOING   3.  Patient will report no pain while driving up to 30 minutes in order to perform normal household  errands and commute to work. Baseline: Unable to tolerate greater than 5 to 10 minutes 11/22/22: no problems Goal status: ONGOING   4.  Patient will demonstrate ability to tolerate uneven surfaces, and incline surfaces without significant exacerbation of pain for at least 10 minutes or greater. Baseline: Unable to tolerate 11/22/22: some discomfort/irritation  Goal status: ONGOING   5.  Patient will be independent with self-management including modalities as indicated and use of prescribed home program.   Baseline: Initiated at evaluation Goal status: ONGOING     PLAN:  PT FREQUENCY: 2x/week  PT DURATION: 8 weeks 01/02/23 continue for 6 more weeks.   PLANNED INTERVENTIONS: Therapeutic exercises, Therapeutic activity, Neuromuscular re-education, Balance training, Gait training, Patient/Family education, Self Care, Joint mobilization, Stair training, Aquatic Therapy, Dry Needling, Electrical stimulation, Cryotherapy, Moist heat, Taping, Manual therapy, and Re-evaluation  PLAN FOR NEXT SESSION: strengthening/flexibility and  Manual therapy as indicated, pain modulation activities.  Patient education related to self-management.  Virgina Organ, PT CLT [phone]  01/14/2023, 1:43 PM

## 2023-01-21 ENCOUNTER — Encounter (HOSPITAL_COMMUNITY): Payer: BLUE CROSS/BLUE SHIELD

## 2023-01-22 ENCOUNTER — Ambulatory Visit (HOSPITAL_COMMUNITY): Payer: BLUE CROSS/BLUE SHIELD | Attending: Podiatry | Admitting: Physical Therapy

## 2023-01-22 ENCOUNTER — Ambulatory Visit (INDEPENDENT_AMBULATORY_CARE_PROVIDER_SITE_OTHER): Payer: BLUE CROSS/BLUE SHIELD

## 2023-01-22 ENCOUNTER — Encounter (HOSPITAL_COMMUNITY): Payer: BLUE CROSS/BLUE SHIELD | Admitting: Physical Therapy

## 2023-01-22 DIAGNOSIS — M79672 Pain in left foot: Secondary | ICD-10-CM | POA: Insufficient documentation

## 2023-01-22 DIAGNOSIS — R51 Headache with orthostatic component, not elsewhere classified: Secondary | ICD-10-CM | POA: Diagnosis not present

## 2023-01-22 DIAGNOSIS — M79671 Pain in right foot: Secondary | ICD-10-CM | POA: Diagnosis present

## 2023-01-22 DIAGNOSIS — R519 Headache, unspecified: Secondary | ICD-10-CM | POA: Diagnosis not present

## 2023-01-22 DIAGNOSIS — H539 Unspecified visual disturbance: Secondary | ICD-10-CM | POA: Diagnosis not present

## 2023-01-22 MED ORDER — GADOBENATE DIMEGLUMINE 529 MG/ML IV SOLN
15.0000 mL | Freq: Once | INTRAVENOUS | Status: AC | PRN
  Administered 2023-01-22: 15 mL via INTRAVENOUS

## 2023-01-22 NOTE — Therapy (Signed)
Physical Therapy Treatment Note   Patient Name: Brenda King MRN: [id number] DOB:[birth year], 62 y.o., female Today's Date: 01/22/2023  END OF SESSION:  PT End of Session - 01/22/23 0808     Visit Number 13    Number of Visits 21    Date for PT Re-Evaluation 02/13/23    Authorization Type BCBS    PT Start Time 0808    PT Stop Time 0848    PT Time Calculation (min) 40 min    Activity Tolerance Patient tolerated treatment well    Behavior During Therapy [facility] for tasks assessed/performed                 Past Medical History:  Diagnosis Date   Anxiety    Atrial fibrillation [facility])    Status post ablation 2004 and 2005 - Dr. Sampson Goon [name])   Constipation    Degenerative disc disease, cervical    Menopause    Nodular infiltrative basal cell carcinoma (BCC) 07/04/2021   Left Shoulder   Past Surgical History:  Procedure Laterality Date   ABDOMINAL HYSTERECTOMY     BREAST ENHANCEMENT SURGERY  2001    CARDIAC ELECTROPHYSIOLOGY MAPPING AND ABLATION     CYSTOSCOPY W/ RETROGRADES Bilateral 04/17/2016   Procedure: CYSTOSCOPY WITH BILATERAL RETROGRADE PYELOGRAMS;  Surgeon: Malen Gauze, MD;  Location: [facility];  Service: Urology;  Laterality: Bilateral;   PARTIAL HYSTERECTOMY  2000    Patient Active Problem List   Diagnosis Date Noted   Migraine with aura and with status migrainosus, not intractable 01/04/2023   Worsening headaches 01/04/2023   Midline low back pain without sciatica 10/02/2022   Migraine without aura and without status migrainosus, not intractable 06/03/2017   Chronic idiopathic constipation 02/04/2013   Allergic rhinitis 07/09/2012    PCP: Tommie Sams, DO  REFERRING PROVIDER: Pilar Plate, DPM  REFERRING DIAG: Plantar fasciitis, left [M72.2], Plantar fasciitis of right foot [M72.2]   THERAPY DIAG:  Pain in right foot  Pain in left foot  Rationale for Evaluation and Treatment: Rehabilitation  ONSET DATE: 2 years  ago  SUBJECTIVE:   SUBJECTIVE STATEMENT  Pt states she is about the same, no new issues to report or change in symptoms.  Eval:  PT states that she has been going to therapy down in [place] however she was unable to go in September/early October due to having terrible migraines.  She is ready to resume therapy but she did not want to travel all the way to [place] as she lives here in [place].   She is much better but she has not been on her feet for about 4 weeks due to her migraines.  Able to stand for 30 minutes.    PERTINENT HISTORY: PMHx includes: anxiety, atrial fibrillation, migraines Patient reporting hx of Rt plantar fascia procedure and Rt bunionectomy procedure in August 2022.   PAIN:  Are you having pain? Yes: NPRS scale: bil foot 3/10 Pain location: bilateral foot pain (medial plantar and heel pain)   Pain description: throbbing  Aggravating factors: weight bearing activities  Relieving factors: not identified at eval   PRECAUTIONS: None  WEIGHT BEARING RESTRICTIONS: No  FALLS:  Has patient fallen in last 6 months? Yes. Number of falls "tripped at work"   LIVING ENVIRONMENT: Lives with: lives with their spouse Lives in: House/apartment Stairs: Yes: External: 2 steps; none Has following equipment at home: None  OCCUPATION: works for [employer]   PLOF: Independent with basic ADLs, Independent  with gait, and Vocation/Vocational requirements: Standing/walking greater than 8 hours  PATIENT GOALS: To decrease pain, improve standing and walking tolerance in order to return to work, conservative management in order to avoid surgical intervention  NEXT MD VISIT: 02/03/23 OBJECTIVE:   DIAGNOSTIC FINDINGS:  07/11/22 BIL Foot x-ray perform; results in Dr. Lenn Sink, DPM office note from same day.   X-rays indicate moderate depression of the arch no other indications of pathology first MPJ which was fixed several years ago appears stable and  looks good   PATIENT SURVEYS:  FOTO 65 current, 71 predicted   COGNITION: Overall cognitive status: Within functional limits for tasks assessed    PALPATION: BIL moderate-to-severe tenderness to palpation along plantar fascia, medial longitudinal arch, medial heel, and medial heel.  She has palpable plantar fibroma with severe tenderness to palpation bilaterally.  Restricted mobility with manual plantar fascial stretching.  LOWER EXTREMITY ROM: to be formally assessed at follow up   Active ROM Right 10/17 Left 10/17  Hip flexion    Hip extension    Hip abduction    Hip adduction    Hip internal rotation    Hip external rotation    Knee flexion    Knee extension    Ankle dorsiflexion 8 4  Ankle plantarflexion Wfl  Wfl   Ankle inversion    Ankle eversion     (Blank rows = not tested)  LOWER EXTREMITY MMT: To be formally assessed at follow-up  MMT Right 01/02/23 Left 01/02/23  Hip flexion 4- 3+  Hip extension 4- 3+  Hip abduction 3+ 3+  Hip adduction    Hip internal rotation    Hip external rotation    Knee flexion 3+ 3+  Knee extension 3+ 3+  Ankle dorsiflexion 3+ 3+  Ankle plantarflexion    Ankle inversion    Ankle eversion     (Blank rows = not tested) 01/02/23 Functional testing: Sit to stand 30 seconds: 12 x; 14 is average for pt  Single leg stance:  RT:  60";  , LT:  2 minute walk test:  next treatment. (500 feet on 01/08/23)   TODAY'S TREATMENT:       01/22/23: Standing:  Heel raises 20X on incline  Toeraises 20X on decline  Plantar fascia stretch 2x 30" on step  Vector stance 3x 5"  Baps level 3 B for dorsi/plantar; in/eversion and rotation 10x  Slant board  2x 30"  01/14/23: Standing: Power step up 6" x 10 B with 3# wt. Goblet squat with 6# Side stepping with green theraband x 2 RT  Planatar fascia stretch 30" x 3 B Baps level 3 B for dorsi/plantar; in/eversion and rotation  Toe raise x 15  Gastroc stretch x 2 x 30"  Leg press 3 Pl x  10  01/10/23: Standing:  Heel raises 20X on incline  Toeraises 20X on decline  Vector stance 3x 5"  Tandem stance on foam 2x 30"  Plantar fascia stretch 2x 30" on step  Squat front of chair 2x 10  Slant board  2x 30"  PATIENT EDUCATION:  Education details: reviewed initial home exercise program; discussion of POC, prognosis and goals for skilled PT; encouraged use of cold pack/frozen water bottle for pain management.  Discussed HEP after prolonged sitting and laying down for symptom management upon weightbearing Person educated: Patient Education method: Explanation, Demonstration, and Handouts Education comprehension: verbalized understanding, returned demonstration, and needs further education  HOME EXERCISE PROGRAM: Access Code: [email or web address] URL: [email or web address] Date: 11/06/2022 Prepared by: Alphonzo Severance  Exercises - Heel Raises with Counter Support  - 1 x daily - 7 x weekly - 3 sets - 10 reps - Long Sitting Plantar Fascia Stretch with Towel  - 3 x daily - 7 x weekly - 1 sets - 2 reps - 2 minutes hold - Seated Toe Towel Scrunches  - 1 x daily - 7 x weekly - 3 sets - 10 reps - Seated Marble Pick-Up with Toes  - 1 x daily - 7 x weekly - 3 sets - 10 reps 01/02/23 - Seated Plantar Fascia Stretch  - 2 x daily - 7 x weekly - 1 sets - 3 reps - 30" hold - Plantar Fascia Stretch on Step  - 2 x daily - 7 x weekly - 1 sets - 3 reps - 30" hold 01/10/23: - Standing 3-Way Kick  - 1-2 x daily - 7 x weekly - 1 sets - 5 reps - 5" hold - Squat with Chair Touch  - 1-2 x daily - 7 x weekly - 2 sets - 10 reps - Gastroc Stretch on Wall  - 2 x daily - 7 x weekly - 3 sets - 3 reps - 30" hold ASSESSMENT:  CLINICAL IMPRESSION:   Continued with focus on improving LE strength with hopes to reduce pain and discomfort. Session limited today due to late arrival,  however able to complete majority of exercises.  Improved control with BAPS today without pain reported.   Stretches continue to be beneficial.  Minimal cues needed to comlete in correct form today.  PT will continue to benefit from skilled PT.   OBJECTIVE IMPAIRMENTS: Abnormal gait, decreased activity tolerance, decreased endurance, decreased mobility, decreased strength, impaired flexibility, and pain.   ACTIVITY LIMITATIONS: carrying, lifting, standing, stairs, transfers, and locomotion level  PARTICIPATION LIMITATIONS: meal prep, cleaning, driving, shopping, community activity, and occupation   PERSONAL FACTORS: Past/current experiences, Profession, Time since onset of injury/illness/exacerbation, and 1-2 comorbidities: PMHx includes: anxiety, atrial fibrillation, migraines  are also affecting patient's functional outcome.   REHAB POTENTIAL: Fair    CLINICAL DECISION MAKING: Evolving/moderate complexity  EVALUATION COMPLEXITY: Moderate   GOALS: Goals reviewed with patient? Yes  SHORT TERM GOALS: Target date: 11/15/2022   Patient will be independent with initial home program for goal foot mobility, management of plantar fifth symptoms.  Baseline: Initiated at eval  Goal status: Met  2.  Patient will report ability to tolerate standing for 10 to 15 minutes in order to perform normal ADLs/IADLs without exacerbation of symptoms.  Baseline: unable  Goal status: Nearly met   LONG TERM GOALS: Target date: 12/13/2022   Patient will report improved overall functional ability with FOTO score of 70 or greater.  Baseline: 65 11/22/22: 60 Goal status: ONGOING   2.  Patient  will report ability to tolerate standing for 30 minutes to an hour without significant exacerbation of symptoms. Baseline: Unable to tolerate 11/22/22: "Was able to cook for 1 hour with some seated breaks."  Goal status: ONGOING   3.  Patient will report no pain while driving up to 30 minutes in order to perform  normal household errands and commute to work. Baseline: Unable to tolerate greater than 5 to 10 minutes 11/22/22: no problems Goal status: ONGOING   4.  Patient will demonstrate ability to tolerate uneven surfaces, and incline surfaces without significant exacerbation of pain for at least 10 minutes or greater. Baseline: Unable to tolerate 11/22/22: some discomfort/irritation  Goal status: ONGOING   5.  Patient will be independent with self-management including modalities as indicated and use of prescribed home program.   Baseline: Initiated at evaluation Goal status: ONGOING     PLAN:  PT FREQUENCY: 2x/week  PT DURATION: 8 weeks 01/02/23 continue for 6 more weeks.   PLANNED INTERVENTIONS: Therapeutic exercises, Therapeutic activity, Neuromuscular re-education, Balance training, Gait training, Patient/Family education, Self Care, Joint mobilization, Stair training, Aquatic Therapy, Dry Needling, Electrical stimulation, Cryotherapy, Moist heat, Taping, Manual therapy, and Re-evaluation  PLAN FOR NEXT SESSION: strengthening/flexibility and  Manual therapy as indicated, pain modulation activities.  Patient education related to self-management.  Lurena Nida, PTA/CLT [facility] Ph: [phone] 01/22/2023, 11:42 AM

## 2023-01-24 ENCOUNTER — Encounter (HOSPITAL_COMMUNITY): Payer: BLUE CROSS/BLUE SHIELD | Admitting: Physical Therapy

## 2023-01-28 ENCOUNTER — Ambulatory Visit (HOSPITAL_COMMUNITY): Payer: BLUE CROSS/BLUE SHIELD | Admitting: Physical Therapy

## 2023-01-28 DIAGNOSIS — M79672 Pain in left foot: Secondary | ICD-10-CM

## 2023-01-28 DIAGNOSIS — M79671 Pain in right foot: Secondary | ICD-10-CM

## 2023-01-28 NOTE — Therapy (Signed)
Physical Therapy Treatment Note/Discharge   Patient Name: Brenda King MRN: [id number] DOB:1960-10-18, 62 y.o., female Today's Date: 01/28/2023 PHYSICAL THERAPY DISCHARGE SUMMARY  Visits from Start of Care: 14  Current functional level related to goals / functional outcomes: See below   Remaining deficits: Pain ranges from 0-4   Education / Equipment: HEP, self manual,    Patient agrees to discharge. Patient goals were met. Patient is being discharged due to meeting the stated rehab goals.  END OF SESSION:  PT End of Session - 01/28/23 1014     Visit Number 14    Number of Visits 14    Date for PT Re-Evaluation 02/13/23    Authorization Type BCBS    PT Start Time 0932    PT Stop Time 1013    PT Time Calculation (min) 41 min    Activity Tolerance Patient tolerated treatment well    Behavior During Therapy [facility] for tasks assessed/performed                  Past Medical History:  Diagnosis Date   Anxiety    Atrial fibrillation (HCC)    Status post ablation 2004 and 2005 - Dr. Sampson Goon [facility])   Constipation    Degenerative disc disease, cervical    Menopause    Nodular infiltrative basal cell carcinoma (BCC) 07/04/2021   Left Shoulder   Past Surgical History:  Procedure Laterality Date   ABDOMINAL HYSTERECTOMY     BREAST ENHANCEMENT SURGERY  2001    CARDIAC ELECTROPHYSIOLOGY MAPPING AND ABLATION     CYSTOSCOPY W/ RETROGRADES Bilateral 04/17/2016   Procedure: CYSTOSCOPY WITH BILATERAL RETROGRADE PYELOGRAMS;  Surgeon: Malen Gauze, MD;  Location: [facility];  Service: Urology;  Laterality: Bilateral;   PARTIAL HYSTERECTOMY  2000    Patient Active Problem List   Diagnosis Date Noted   Migraine with aura and with status migrainosus, not intractable 01/04/2023   Worsening headaches 01/04/2023   Midline low back pain without sciatica 10/02/2022   Migraine without aura and without status migrainosus, not intractable 06/03/2017   Chronic idiopathic  constipation 02/04/2013   Allergic rhinitis 07/09/2012    PCP: Tommie Sams, DO  REFERRING PROVIDER: Pilar Plate, DPM  REFERRING DIAG: Plantar fasciitis, left [M72.2], Plantar fasciitis of right foot [M72.2]   THERAPY DIAG:  Pain in right foot  Pain in left foot  Rationale for Evaluation and Treatment: Rehabilitation  ONSET DATE: 2 years ago  SUBJECTIVE:  PT states that her main concern at this time is her neck which is a chronic condition and not what we are seeing her for, she is hurting in her low back is hurting as well.  She was up on her feet a lot.    SUBJECTIVE STATEMENT    PERTINENT HISTORY: PMHx includes: anxiety, atrial fibrillation, migraines Patient reporting hx of Rt plantar fascia procedure and Rt bunionectomy procedure in August 2022.   PAIN:  Are you having pain? Yes: NPRS scale: bil foot 1/10 Pain location: bilateral foot pain (medial plantar and heel pain)   Pain description: throbbing  Aggravating factors: weight bearing activities  Relieving factors: not identified at eval   PRECAUTIONS: None  WEIGHT BEARING RESTRICTIONS: No  FALLS:  Has patient fallen in last 6 months? Yes. Number of falls "tripped at work"   LIVING ENVIRONMENT: Lives with: lives with their spouse Lives in: House/apartment Stairs: Yes: External: 2 steps; none Has following equipment at home: None  OCCUPATION: works for [employer]   PLOF: Independent with basic ADLs, Independent with gait, and Vocation/Vocational requirements: Standing/walking greater than 8 hours  PATIENT GOALS: To decrease pain, improve standing and walking tolerance in order to return to work, conservative management in order to avoid surgical intervention  NEXT MD VISIT: 02/03/23 OBJECTIVE:   DIAGNOSTIC FINDINGS:  07/11/22 BIL Foot x-ray perform; results in Dr. Lenn Sink, DPM office note from same day.   X-rays indicate moderate depression of the arch no other  indications of pathology first MPJ which was fixed several years ago appears stable and looks good   PATIENT SURVEYS:  FOTO 65 current, 71 predicted   COGNITION: Overall cognitive status: Within functional limits for tasks assessed    PALPATION: BIL moderate-to-severe tenderness to palpation along plantar fascia, medial longitudinal arch, medial heel, and medial heel.  She has palpable plantar fibroma with severe tenderness to palpation bilaterally.  Restricted mobility with manual plantar fascial stretching.  LOWER EXTREMITY ROM: to be formally assessed at follow up   Active ROM Right 10/17 Right 11/12 Left 10/17 Left  11/12   Hip flexion      Hip extension      Hip abduction      Hip adduction      Hip internal rotation      Hip external rotation      Knee flexion      Knee extension      Ankle dorsiflexion 8 10 4 10   Ankle plantarflexion Wfl   Wfl    Ankle inversion      Ankle eversion       (Blank rows = not tested)  LOWER EXTREMITY MMT: To be formally assessed at follow-up  MMT Right 01/02/23 Right 11/12 Left 01/02/23 Left 11/12  Hip flexion 4- 4+ 3+ 5  Hip extension 4- 5 3+ 5  Hip abduction 3+ 5/5 3+ 4+/5  Hip adduction      Hip internal rotation      Hip external rotation      Knee flexion 3+ 5 3+ 5  Knee extension 3+ 4+ 3+ 4+  Ankle dorsiflexion 3+ 4 3+ 4  Ankle plantarflexion      Ankle inversion      Ankle eversion       (Blank rows = not tested) 01/02/23 Functional testing: Sit to stand 30 seconds: 12 x; 14 is average for pt  Single leg stance:  RT:  60";  , LT:  2 minute walk test:  next treatment. (500 feet on 01/08/23) 11/12: 30 second sit to stand :13 ; 14 is average for pt age and sex states she can do this at home.  Single leg stance: LT: 60"; RT: 60" 2 minute walk test :  628 ft was 500     TODAY'S TREATMENT:       01/28/23 Functional testing see above Manual mm testing see above Dorsiflexion resisted with green theraband x 10  B Plantar fascia stretch x 30" x 3  PATIENT EDUCATION:  Education details: reviewed initial home exercise program; discussion of POC, prognosis and goals for skilled PT; encouraged use of cold pack/frozen water bottle for pain management.  Discussed HEP after prolonged sitting and laying down for symptom management upon weightbearing Person educated: Patient Education method: Explanation, Demonstration, and Handouts Education comprehension: verbalized understanding, returned demonstration, and needs further education  HOME EXERCISE PROGRAM: Access Code: [email or web address] URL: [email or web address] Date: 11/06/2022 Prepared by: Alphonzo Severance  Exercises - Heel Raises with Counter Support  -  1 x daily - 7 x weekly - 3 sets - 10 reps - Long Sitting Plantar Fascia Stretch with Towel  - 3 x daily - 7 x weekly - 1 sets - 2 reps - 2 minutes hold - Seated Toe Towel Scrunches  - 1 x daily - 7 x weekly - 3 sets - 10 reps - Seated Marble Pick-Up with Toes  - 1 x daily - 7 x weekly - 3 sets - 10 reps 01/02/23 - Seated Plantar Fascia Stretch  - 2 x daily - 7 x weekly - 1 sets - 3 reps - 30" hold - Plantar Fascia Stretch on Step  - 2 x daily - 7 x weekly - 1 sets - 3 reps - 30" hold 01/10/23: - Standing 3-Way Kick  - 1-2 x daily - 7 x weekly - 1 sets - 5 reps - 5" hold - Squat with Chair Touch  - 1-2 x daily - 7 x weekly - 2 sets - 10 reps - Gastroc Stretch on Wall  - 2 x daily - 7 x weekly - 3 sets - 3 reps - 30" hold 11/12/2- Seated Ankle Dorsiflexion with Resistance  - 1 x daily - 7 x weekly - 1 sets - 10 reps - 3-5 seconds  hold  ASSESSMENT:  CLINICAL IMPRESSION:   PT  reassessed with noted improvement in ROM and strength.  PT has met all goals and is ready to be discharged to a HEP.   OBJECTIVE IMPAIRMENTS: Abnormal gait, decreased activity tolerance, decreased endurance, decreased mobility, decreased strength, impaired flexibility, and pain.   ACTIVITY LIMITATIONS: carrying, lifting,  standing, stairs, transfers, and locomotion level  PARTICIPATION LIMITATIONS: meal prep, cleaning, driving, shopping, community activity, and occupation   PERSONAL FACTORS: Past/current experiences, Profession, Time since onset of injury/illness/exacerbation, and 1-2 comorbidities: PMHx includes: anxiety, atrial fibrillation, migraines  are also affecting patient's functional outcome.   REHAB POTENTIAL: Fair    CLINICAL DECISION MAKING: Evolving/moderate complexity  EVALUATION COMPLEXITY: Moderate   GOALS: Goals reviewed with patient? Yes  SHORT TERM GOALS: Target date: 11/15/2022   Patient will be independent with initial home program for goal foot mobility, management of plantar fifth symptoms.  Baseline: Initiated at eval  Goal status: Met  2.  Patient will report ability to tolerate standing for 10 to 15 minutes in order to perform normal ADLs/IADLs without exacerbation of symptoms.  Baseline: unable  Goal status: met   LONG TERM GOALS: Target date: 12/13/2022   Patient will report improved overall functional ability with FOTO score of 70 or greater.  Baseline: 65 11/22/22: 60 Goal status: ONGOING   2.  Patient will report ability to tolerate standing for 30 minutes to an hour without significant exacerbation of symptoms. Baseline: Unable to tolerate 11/22/22: "Was able to cook for 1 hour with some seated breaks."  Goal status:met   3.  Patient will report no pain while driving up to 30 minutes in order to perform normal household errands and commute to work. Baseline: Unable to tolerate greater than 5 to 10 minutes 11/22/22: no problems Goal status: met    4.  Patient will demonstrate ability to tolerate uneven surfaces, and incline surfaces without significant exacerbation of pain for at least 10 minutes or greater. Baseline: Unable to tolerate 11/22/22: some discomfort/irritation  Goal status: ONGOING up steps   5.  Patient will be independent with self-management  including modalities as indicated and use of prescribed home program.   Baseline: Initiated at evaluation  Goal status: met    PLAN:  PT FREQUENCY: 2x/week  PT DURATION: 8 weeks 01/02/23 continue for 6 more weeks.   PLANNED INTERVENTIONS: Therapeutic exercises, Therapeutic activity, Neuromuscular re-education, Balance training, Gait training, Patient/Family education, Self Care, Joint mobilization, Stair training, Aquatic Therapy, Dry Needling, Electrical stimulation, Cryotherapy, Moist heat, Taping, Manual therapy, and Re-evaluation  PLAN FOR NEXT SESSION: discharge [phone]  [facility] Ph: [phone] 01/28/2023, 10:16 AM

## 2023-01-29 ENCOUNTER — Encounter (HOSPITAL_COMMUNITY): Payer: BLUE CROSS/BLUE SHIELD | Admitting: Physical Therapy

## 2023-01-30 ENCOUNTER — Telehealth: Payer: Self-pay

## 2023-01-30 ENCOUNTER — Other Ambulatory Visit (HOSPITAL_COMMUNITY): Payer: Self-pay

## 2023-01-30 NOTE — Telephone Encounter (Signed)
*  [facility]  Pharmacy Patient Advocate Encounter   Received notification from CoverMyMeds that prior authorization for Qulipta 60MG  tablets  is required/requested.   Insurance verification completed.   The patient is insured through [facility] .   Per test claim: PA required; PA started via CoverMyMeds. KEY [id number] . Waiting for clinical questions to populate.

## 2023-01-30 NOTE — Telephone Encounter (Signed)
Pharmacy Patient Advocate Encounter  Received notification from [facility] that Prior Authorization for Qulipta 60MG  tablets  has been APPROVED from 01/30/2023 to 04/30/2023

## 2023-01-31 ENCOUNTER — Encounter (HOSPITAL_COMMUNITY): Payer: BLUE CROSS/BLUE SHIELD | Admitting: Physical Therapy

## 2023-02-03 ENCOUNTER — Ambulatory Visit (INDEPENDENT_AMBULATORY_CARE_PROVIDER_SITE_OTHER): Payer: BLUE CROSS/BLUE SHIELD | Admitting: Podiatry

## 2023-02-03 ENCOUNTER — Encounter: Payer: Self-pay | Admitting: Podiatry

## 2023-02-03 DIAGNOSIS — M79672 Pain in left foot: Secondary | ICD-10-CM

## 2023-02-03 DIAGNOSIS — M722 Plantar fascial fibromatosis: Secondary | ICD-10-CM

## 2023-02-03 DIAGNOSIS — M79671 Pain in right foot: Secondary | ICD-10-CM | POA: Diagnosis not present

## 2023-02-03 NOTE — Progress Notes (Signed)
Subjective:   Patient ID: Brenda King, female   DOB: 62 y.o.   MRN: [id number]   HPI Patient states she is improving still having pain and they are switching shoes in January and hopefully with that we can prevent reoccurrence of what she has experienced   ROS      Objective:  Physical Exam  Neuro vascular status intact discomfort in the mid arch area left over right still present with small nodular formation but improved from previous     Assessment:  Plantar fibromas bilateral that are slowly improving but still giving some trouble     Plan:  I dispensed the second night splint and I think that is been very helpful for her but it is very hard for her to do 1 foot at a time.  Will keep her off work 6 weeks but I want her to start walking now and testing her arches and hopefully symptoms will be avoided and I want to see her back in 3 months and I want her to return to work beginning of January especially not having to wear boots to work.  Reappoint to recheck

## 2023-02-04 ENCOUNTER — Encounter: Payer: Self-pay | Admitting: Podiatry

## 2023-02-05 ENCOUNTER — Encounter (HOSPITAL_COMMUNITY): Payer: BLUE CROSS/BLUE SHIELD | Admitting: Physical Therapy

## 2023-02-05 ENCOUNTER — Telehealth: Payer: Self-pay | Admitting: Podiatry

## 2023-02-05 NOTE — Telephone Encounter (Signed)
Faxed completed 'Return to Work Statement' received from [place].  Per Dr letter on file, RTW date is 04/02/2023 at this time  .Marland Kitchen...   J. Abbott -- 02/05/2023

## 2023-02-07 ENCOUNTER — Encounter (HOSPITAL_COMMUNITY): Payer: BLUE CROSS/BLUE SHIELD | Admitting: Physical Therapy

## 2023-02-07 ENCOUNTER — Other Ambulatory Visit: Payer: Self-pay | Admitting: Podiatry

## 2023-02-10 ENCOUNTER — Ambulatory Visit: Payer: BLUE CROSS/BLUE SHIELD | Admitting: Neurology

## 2023-02-12 ENCOUNTER — Encounter (HOSPITAL_COMMUNITY): Payer: BLUE CROSS/BLUE SHIELD | Admitting: Physical Therapy

## 2023-02-19 ENCOUNTER — Encounter (HOSPITAL_COMMUNITY): Payer: BLUE CROSS/BLUE SHIELD | Admitting: Physical Therapy

## 2023-02-21 ENCOUNTER — Ambulatory Visit (INDEPENDENT_AMBULATORY_CARE_PROVIDER_SITE_OTHER): Payer: BLUE CROSS/BLUE SHIELD | Admitting: Family Medicine

## 2023-02-21 ENCOUNTER — Ambulatory Visit: Payer: Self-pay | Admitting: Family Medicine

## 2023-02-21 ENCOUNTER — Encounter: Payer: Self-pay | Admitting: Family Medicine

## 2023-02-21 VITALS — BP 117/77 | HR 82 | Temp 97.2°F | Ht 69.0 in | Wt 170.0 lb

## 2023-02-21 DIAGNOSIS — J029 Acute pharyngitis, unspecified: Secondary | ICD-10-CM

## 2023-02-21 DIAGNOSIS — J989 Respiratory disorder, unspecified: Secondary | ICD-10-CM | POA: Diagnosis not present

## 2023-02-21 LAB — POCT RAPID STREP A (OFFICE): Rapid Strep A Screen: NEGATIVE

## 2023-02-21 MED ORDER — OSELTAMIVIR PHOSPHATE 75 MG PO CAPS: 75.0000 mg | ORAL_CAPSULE | Freq: Two times a day (BID) | ORAL | 0 refills | Status: DC

## 2023-02-21 MED ORDER — PREDNISONE 50 MG PO TABS: 50.0000 mg | ORAL_TABLET | Freq: Every day | ORAL | 0 refills | Status: AC

## 2023-02-21 NOTE — Telephone Encounter (Signed)
Copied from CRM [id number]. Topic: Clinical - Pink Word Triage >> Feb 21, 2023  1:38 PM Dominique A wrote: Reason for Triage: pt has a sore throat  Chief Complaint: productive cough Symptoms: deep yellow or brownish sputum, sore throat, fatigue  Frequency: ongoing 2 days Pertinent Negatives: Patient denies fever and shortness of breath  Disposition: [] ED /[] Urgent Care (no appt availability in office) / [x] Appointment(In office/virtual)/ []  [facility]/ [] Home Care/ [] Refused Recommended Disposition /[] [facility]/ []  Follow-up with PCP Additional Notes: The patient's husband was sick and running fever for sever days and was diagnosed with strand B flu Tuesday.  The patient wants to be swabbed to determine if she is positive too.  She started getting sick the past 2 days. Tested negative for Covid home test.  The patient reported a very sore throat.  She has been using throat lozenges very frequently and cough medicine.  She feels very fatigued.  Scheduled for same day appointment as she reported deep yellow and brownish sputum.  Reason for Disposition  Coughing up rusty-colored (reddish-brown) sputum  Answer Assessment - Initial Assessment Questions 1. ONSET: "When did the cough begin?"      2 days 2. SEVERITY: "How bad is the cough today?"      Taking cough medicine that is controlling the cough 3. SPUTUM: "Describe the color of your sputum" (none, dry cough; clear, white, yellow, green)     Deep yellow/brown  4. HEMOPTYSIS: "Are you coughing up any blood?" If so ask: "How much?" (flecks, streaks, tablespoons, etc.)     None 5. DIFFICULTY BREATHING: "Are you having difficulty breathing?" If Yes, ask: "How bad is it?" (e.g., mild, moderate, severe)    - MILD: No SOB at rest, mild SOB with walking, speaks normally in sentences, can lie down, no retractions, pulse < 100.    - MODERATE: SOB at rest, SOB with minimal exertion and prefers to sit, cannot lie down flat, speaks  in phrases, mild retractions, audible wheezing, pulse 100-120.    - SEVERE: Very SOB at rest, speaks in single words, struggling to breathe, sitting hunched forward, retractions, pulse > 120      None 6. FEVER: "Do you have a fever?" If Yes, ask: "What is your temperature, how was it measured, and when did it start?"     No fever but feeling a little clammy  7. CARDIAC HISTORY: "Do you have any history of heart disease?" (e.g., heart attack, congestive heart failure)      PAC 8. LUNG HISTORY: "Do you have any history of lung disease?"  (e.g., pulmonary embolus, asthma, emphysema)     None 9. PE RISK FACTORS: "Do you have a history of blood clots?" (or: recent major surgery, recent prolonged travel, bedridden)     None  10. OTHER SYMPTOMS: "Do you have any other symptoms?" (e.g., runny nose, wheezing, chest pain)       Sore throat  tired  Protocols used: Cough - Acute Productive-A-AH

## 2023-02-21 NOTE — Patient Instructions (Signed)
Rest, fluids. ° °Medication as prescribed. ° °Take care ° °Dr. Tasmine Hipwell  °

## 2023-02-22 LAB — COVID-19, FLU A+B AND RSV
Influenza A, NAA: NOT DETECTED
Influenza B, NAA: NOT DETECTED
RSV, NAA: NOT DETECTED
SARS-CoV-2, NAA: NOT DETECTED

## 2023-02-22 LAB — SPECIMEN STATUS REPORT

## 2023-02-23 DIAGNOSIS — J989 Respiratory disorder, unspecified: Secondary | ICD-10-CM | POA: Insufficient documentation

## 2023-02-23 NOTE — Assessment & Plan Note (Signed)
Rapid strep negative. Discussed empiric treatment for flu in the setting of exposure and limitations of testing (time). She wanted to proceed with empiric flu treatment. Rx sent. Prednisone for severe pharyngitis.

## 2023-02-23 NOTE — Progress Notes (Signed)
Subjective:  Patient ID: Brenda King, female    DOB: 30-Apr-1960  Age: 62 y.o. MRN: [id number]  CC:   Chief Complaint  Patient presents with   Sore Throat    Cough- dry but productive yellow/ brown phlem- worse at night     HPI:  62 year old female presents for evaluation of the above.  2 days of symptoms. Reports cough, sore throat. Cough worse at night. No fever. Husband with influenza. No relieving factors. No other complaints.   Patient Active Problem List   Diagnosis Date Noted   Respiratory illness 02/23/2023   Worsening headaches 01/04/2023   Midline low back pain without sciatica 10/02/2022   Migraine without aura and without status migrainosus, not intractable 06/03/2017   Chronic idiopathic constipation 02/04/2013   Allergic rhinitis 07/09/2012    Social Hx   Social History   Socioeconomic History   Marital status: Married    Spouse name: Not on file   Number of children: Not on file   Years of education: Not on file   Highest education level: Not on file  Occupational History   Not on file  Tobacco Use   Smoking status: Former    Current packs/day: 0.25    Average packs/day: 0.3 packs/day for 30.0 years (7.5 ttl pk-yrs)    Types: Cigarettes   Smokeless tobacco: Former    Quit date: 11/04/2012  Vaping Use   Vaping status: Every Day  Substance and Sexual Activity   Alcohol use: No    Alcohol/week: 0.0 standard drinks of alcohol   Drug use: No   Sexual activity: Yes    Birth control/protection: Surgical  Other Topics Concern   Not on file  Social History Narrative   Not on file   Social Determinants of Health   Financial Resource Strain: Not on file  Food Insecurity: Not on file  Transportation Needs: Not on file  Physical Activity: Not on file  Stress: Not on file (01/23/2023)  Social Connections: Not on file    Review of Systems Per HPI  Objective:  BP 117/77   Pulse 82   Temp (!) 97.2 F (36.2 C)   Ht 5\' 9"  (1.753 m)   Wt 170  lb (77.1 kg)   SpO2 98%   BMI 25.10 kg/m      02/21/2023    3:05 PM 01/01/2023   10:59 AM 10/01/2022    1:07 PM  BP/Weight  Systolic BP 117 109 118  Diastolic BP 77 68 78  Wt. (Lbs) 170 168 162  BMI 25.1 kg/m2 24.81 kg/m2 23.24 kg/m2    Physical Exam Vitals and nursing note reviewed.  Constitutional:      General: She is not in acute distress. HENT:     Head: Normocephalic and atraumatic.     Nose: No rhinorrhea.     Mouth/Throat:     Pharynx: Posterior oropharyngeal erythema present.  Cardiovascular:     Rate and Rhythm: Normal rate and regular rhythm.  Pulmonary:     Effort: Pulmonary effort is normal.     Breath sounds: Normal breath sounds.  Neurological:     Mental Status: She is alert.     Lab Results  Component Value Date   WBC 5.5 01/01/2023   HGB 13.7 01/01/2023   HCT 41.1 01/01/2023   PLT 331 01/01/2023   GLUCOSE 73 01/01/2023   CHOL 161 12/20/2016   TRIG 60 12/20/2016   HDL 61 12/20/2016   LDLCALC 88 12/20/2016  ALT 15 01/01/2023   AST 18 01/01/2023   NA 140 01/01/2023   K 4.4 01/01/2023   CL 103 01/01/2023   CREATININE 0.74 01/01/2023   BUN 8 01/01/2023   CO2 25 01/01/2023   TSH 2.340 01/01/2023     Assessment & Plan:   Problem List Items Addressed This Visit       Respiratory   Respiratory illness - Primary    Rapid strep negative. Discussed empiric treatment for flu in the setting of exposure and limitations of testing (time). She wanted to proceed with empiric flu treatment. Rx sent. Prednisone for severe pharyngitis.      Other Visit Diagnoses     Sore throat       Relevant Orders   COVID-19, Flu A+B and RSV (Completed)   POCT rapid strep A (Completed)   Culture, Group A Strep (Completed)       Meds ordered this encounter  Medications   oseltamivir (TAMIFLU) 75 MG capsule    Sig: Take 1 capsule (75 mg total) by mouth 2 (two) times daily.    Dispense:  10 capsule    Refill:  0   predniSONE (DELTASONE) 50 MG tablet     Sig: Take 1 tablet (50 mg total) by mouth daily for 5 days.    Dispense:  5 tablet    Refill:  0    Follow-up:  Return if symptoms worsen or fail to improve.  Everlene Other DO [facility]

## 2023-02-24 LAB — SPECIMEN STATUS REPORT

## 2023-02-24 LAB — CULTURE, GROUP A STREP: Strep A Culture: NEGATIVE

## 2023-03-03 ENCOUNTER — Telehealth: Payer: Self-pay

## 2023-03-03 NOTE — Telephone Encounter (Signed)
Need Referral to [facility] for Colonoscopy Pt would like a Female provider if there is one available

## 2023-03-04 ENCOUNTER — Other Ambulatory Visit: Payer: Self-pay

## 2023-03-04 DIAGNOSIS — Z1211 Encounter for screening for malignant neoplasm of colon: Secondary | ICD-10-CM

## 2023-03-04 NOTE — Telephone Encounter (Signed)
Referral to Dr Marletta Lor has been placed for colonoscopy, My chart message sent to pt informing

## 2023-03-04 NOTE — Telephone Encounter (Signed)
Tommie Sams, DO     Please place referral. No female provider that scopes. Recommend Dr. Marletta Lor.

## 2023-03-06 ENCOUNTER — Encounter (INDEPENDENT_AMBULATORY_CARE_PROVIDER_SITE_OTHER): Payer: Self-pay | Admitting: *Deleted

## 2023-03-24 NOTE — Progress Notes (Signed)
 GI Office Note    Referring Provider: Cook, Jayce G, DO Primary Care Physician:  Cook, Jayce G, DO  Primary Gastroenterologist: Ozell Hollingshead, MD   Chief Complaint   Chief Complaint  Patient presents with   Colonoscopy     History of Present Illness   Brenda King is a 63 y.o. female presenting today to schedule colonoscopy. Patient reports last successful colonoscopy more than 10-11 years ago. States she has had issues with bowel preps in the past. Allergic to polyethylene glycol, bisacodyl, docusate sodium. Develops hives, itching, shortness of breath.   She has chronic constipation, uses smooth move tea 2-3 times per week. Has BM 3-4 days per week. She did not tolerate Linzess in the past due to abdominal cramping. She denies melena, brbpr. She notes recent change in stool caliber, pencil thin stools. She has bloating and abdominal discomfort in the setting of constipation. No ugi symptoms. No unintentional weight loss.    FH colon cancer, maternal uncle.  Medications   Current Outpatient Medications  Medication Sig Dispense Refill   ALPRAZolam  (XANAX ) 0.5 MG tablet Take 0.5 mg by mouth as needed. Taking as needed related to menopause symptoms     aspirin EC 81 MG tablet Take 81 mg by mouth daily. Swallow whole.     Aspirin-Acetaminophen -Caffeine (EXCEDRIN MIGRAINE PO) Take by mouth as needed.     cyanocobalamin (VITAMIN B12) 1000 MCG tablet Take 1,000 mcg by mouth daily.     EQL EVENING PRIMROSE OIL PO Take by mouth.     escitalopram (LEXAPRO) 10 MG tablet Take 10 mg by mouth daily.     estradiol (ESTRACE) 2 MG tablet Take 2 mg by mouth daily.     progesterone (PROMETRIUM) 100 MG capsule Take 100 mg by mouth at bedtime.     Rimegepant Sulfate (NURTEC) 75 MG TBDP Take 1 tablet (75 mg total) by mouth daily as needed. For migraines. Take as close to onset of migraine as possible. One daily maximum. Please use copay card: BIN [id number] PCN CN GRP [id number] ID  [id number] 16 tablet 6   tretinoin (RETIN-A) 0.05 % cream SMARTSIG:sparingly Topical Every Night PRN     No current facility-administered medications for this visit.    Allergies   Allergies as of 03/25/2023 - Review Complete 03/25/2023  Allergen Reaction Noted   Docusate sodium Hives and Shortness Of Breath 02/04/2013   Miralax [polyethylene glycol] Hives and Shortness Of Breath 07/09/2012   Other Hives 07/01/2013   Bisacodyl Other (See Comments) 07/04/2021   Chlorzoxazone   09/26/2014   Golytely [peg 3350-kcl-nabcb-nacl-nasulf]  03/25/2023   Latex Itching 04/11/2016   Topamax  [topiramate ]  07/10/2018    Past Medical History   Past Medical History:  Diagnosis Date   Anxiety    Atrial fibrillation (HCC)    Status post ablation 2004 and 2005 - Dr. Epifanio [facility])   Constipation    Degenerative disc disease, cervical    Menopause    Nodular infiltrative basal cell carcinoma (BCC) 07/04/2021   Left Shoulder    Past Surgical History   Past Surgical History:  Procedure Laterality Date   ABDOMINAL HYSTERECTOMY     BREAST ENHANCEMENT SURGERY  2001   BUNIONECTOMY Right    CARDIAC ELECTROPHYSIOLOGY MAPPING AND ABLATION     CYSTOSCOPY W/ RETROGRADES Bilateral 04/17/2016   Procedure: CYSTOSCOPY WITH BILATERAL RETROGRADE PYELOGRAMS;  Surgeon: Belvie LITTIE Clara, MD;  Location: [facility];  Service: Urology;  Laterality: Bilateral;   PARTIAL HYSTERECTOMY  2000   PLANTAR FASCIECTOMY Right    SHOULDER ARTHROSCOPY     frozen shoulder   SKIN CANCER EXCISION Left    left shoulder    Past Family History   Family History  Problem Relation Age of Onset   CAD Father    Heart disease Father    CAD Brother    Throat cancer Maternal Grandfather    Cancer Other        breast   Cancer - Colon Maternal Uncle    Colon cancer Neg Hx     Past Social History   Social History   Socioeconomic History   Marital status: Married    Spouse name: Not on file   Number of children: Not  on file   Years of education: Not on file   Highest education level: Not on file  Occupational History   Not on file  Tobacco Use   Smoking status: Former    Current packs/day: 0.25    Average packs/day: 0.3 packs/day for 30.0 years (7.5 ttl pk-yrs)    Types: Cigarettes   Smokeless tobacco: Former    Quit date: 11/04/2012  Vaping Use   Vaping status: Every Day  Substance and Sexual Activity   Alcohol use: No    Alcohol/week: 0.0 standard drinks of alcohol   Drug use: No   Sexual activity: Yes    Birth control/protection: Surgical  Other Topics Concern   Not on file  Social History Narrative   Not on file   Social Drivers of Health   Financial Resource Strain: Not on file  Food Insecurity: Not on file  Transportation Needs: Not on file  Physical Activity: Not on file  Stress: Not on file (01/23/2023)  Social Connections: Not on file  Intimate Partner Violence: Not on file    Review of Systems   General: Negative for anorexia, weight loss, fever, chills, fatigue, weakness. Eyes: Negative for vision changes.  ENT: Negative for hoarseness, difficulty swallowing , nasal congestion. CV: Negative for chest pain, angina, palpitations, dyspnea on exertion, peripheral edema.  Respiratory: Negative for dyspnea at rest, dyspnea on exertion, cough, sputum, wheezing.  GI: See history of present illness. GU:  Negative for dysuria, hematuria, urinary incontinence, urinary frequency, nocturnal urination.  MS: Negative for joint pain, low back pain. Chronic foot pain. Derm: Negative for rash or itching.  Neuro: Negative for weakness, abnormal sensation, seizure, frequent headaches, memory loss,  confusion.  Psych: Negative for anxiety, depression, suicidal ideation, hallucinations.  Endo: Negative for unusual weight change.  Heme: Negative for bruising or bleeding. Allergy: Negative for rash or hives.  Physical Exam   BP 126/75 (BP Location: Right Arm, Patient Position: Sitting,  Cuff Size: Normal)   Pulse 75   Temp 98.8 F (37.1 C) (Oral)   Ht 5' 9.5 (1.765 m)   Wt 173 lb 3.2 oz (78.6 kg)   SpO2 97%   BMI 25.21 kg/m    General: Well-nourished, well-developed in no acute distress.  Head: Normocephalic, atraumatic.   Eyes: Conjunctiva pink, no icterus. Mouth: Oropharyngeal mucosa moist and pink  Neck: Supple without thyromegaly, masses, or lymphadenopathy.  Lungs: Clear to auscultation bilaterally.  Heart: Regular rate and rhythm, no murmurs rubs or gallops.  Abdomen: Bowel sounds are normal, nontender, nondistended, no hepatosplenomegaly or masses,  no abdominal bruits or hernia, no rebound or guarding.   Rectal: not performed Extremities: No lower extremity edema. No clubbing or deformities.  Neuro: Alert and oriented x 4 , grossly  normal neurologically.  Skin: Warm and dry, no rash or jaundice.   Psych: Alert and cooperative, normal mood and affect.  Labs   Lab Results  Component Value Date   NA 140 01/01/2023   CL 103 01/01/2023   K 4.4 01/01/2023   CO2 25 01/01/2023   BUN 8 01/01/2023   CREATININE 0.74 01/01/2023   EGFR 93 01/01/2023   CALCIUM 9.7 01/01/2023   ALBUMIN 4.5 01/01/2023   GLUCOSE 73 01/01/2023   Lab Results  Component Value Date   ALT 15 01/01/2023   AST 18 01/01/2023   ALKPHOS 85 01/01/2023   BILITOT <0.2 01/01/2023   Lab Results  Component Value Date   WBC 5.5 01/01/2023   HGB 13.7 01/01/2023   HCT 41.1 01/01/2023   MCV 99 (H) 01/01/2023   PLT 331 01/01/2023   Lab Results  Component Value Date   TSH 2.340 01/01/2023    Imaging Studies   No results found.  Assessment/Plan:   Change in bowel habits, decrease in stool caliber in the setting of chronic constipation. Patient desires a colonoscopy which is reasonable giving change in stools. Remote colonoscopy over 10 years ago per her report. Reports allergies to laxatives/bowel preps as outlined and therefore has been apprehensive in pursuing colonoscopy but  really desires successful completion at this time.  -colonoscopy with Dr. Shaaron. ASA 2.  I have discussed the risks, alternatives, benefits with regards to but not limited to the risk of reaction to medication, bleeding, infection, perforation and the patient is agreeable to proceed. Written consent to be obtained. -increase water  intake, she does not drink enough of decaf fluids daily.  -continue high fiber diet -use smooth move tea especially the week prior to colonoscopy to get bowels moving regularly. She does not tolerate numerous medications for constipation either due to side effect or allergic reactions, therefore will not change her regimen at this time -we will use Clenpiq  for bowel prep, avoiding polyethylene glycol, polysorbates. She will have benadryl on hand just in case.   Sonny RAMAN. Ezzard, MHS, PA-C [facility]

## 2023-03-25 ENCOUNTER — Encounter: Payer: Self-pay | Admitting: Gastroenterology

## 2023-03-25 ENCOUNTER — Ambulatory Visit (INDEPENDENT_AMBULATORY_CARE_PROVIDER_SITE_OTHER): Payer: BLUE CROSS/BLUE SHIELD | Admitting: Gastroenterology

## 2023-03-25 VITALS — BP 126/75 | HR 75 | Temp 98.8°F | Ht 69.5 in | Wt 173.2 lb

## 2023-03-25 DIAGNOSIS — R195 Other fecal abnormalities: Secondary | ICD-10-CM

## 2023-03-25 DIAGNOSIS — R194 Change in bowel habit: Secondary | ICD-10-CM | POA: Insufficient documentation

## 2023-03-25 DIAGNOSIS — K5909 Other constipation: Secondary | ICD-10-CM

## 2023-03-25 DIAGNOSIS — K5904 Chronic idiopathic constipation: Secondary | ICD-10-CM

## 2023-03-25 NOTE — Patient Instructions (Addendum)
 Colonoscopy to be scheduled. Separate instructions to follow.  Make sure you adequately hydrate at least starting 2 days before your bowel prep and during your bowel prep.  Have benadryl on hand just in case you have a reaction to the bowel prep. Try to drink at least 80 ounces of decaf fluid daily. Continue high fiber diet.

## 2023-03-27 ENCOUNTER — Telehealth: Payer: Self-pay | Admitting: *Deleted

## 2023-03-27 NOTE — Telephone Encounter (Signed)
 LMTRC  TCS w/Dr.Rourk, asa 2, 2 full days of clears liquid

## 2023-03-28 MED ORDER — CLENPIQ 10-3.5-12 MG-GM -GM/175ML PO SOLN: 1.0000 | ORAL | 0 refills | Status: DC

## 2023-03-28 NOTE — Telephone Encounter (Signed)
 Patient returned call. Scheduled for 1/30. Aware will send instructions to her. Rx for clenpiq sent to pharmacy,

## 2023-03-28 NOTE — Addendum Note (Signed)
 Addended by: Armstead Peaks on: 03/28/2023 11:03 AM   Modules accepted: Orders

## 2023-03-31 ENCOUNTER — Encounter: Payer: Self-pay | Admitting: Podiatry

## 2023-04-17 ENCOUNTER — Other Ambulatory Visit: Payer: Self-pay

## 2023-04-17 ENCOUNTER — Encounter (HOSPITAL_COMMUNITY)
Admission: RE | Disposition: A | Discharge status: Home / Self Care | Payer: Self-pay | Source: Home / Self Care | Attending: Internal Medicine

## 2023-04-17 ENCOUNTER — Ambulatory Visit (HOSPITAL_COMMUNITY): Payer: Self-pay | Admitting: Anesthesiology

## 2023-04-17 ENCOUNTER — Ambulatory Visit (HOSPITAL_COMMUNITY)
Admission: RE | Admit: 2023-04-17 | Discharge: 2023-04-17 | Disposition: A | Discharge status: Home / Self Care | Payer: BLUE CROSS/BLUE SHIELD | Attending: Internal Medicine | Admitting: Internal Medicine

## 2023-04-17 ENCOUNTER — Encounter (HOSPITAL_COMMUNITY): Payer: Self-pay | Admitting: Internal Medicine

## 2023-04-17 DIAGNOSIS — K5730 Diverticulosis of large intestine without perforation or abscess without bleeding: Secondary | ICD-10-CM

## 2023-04-17 DIAGNOSIS — Z87891 Personal history of nicotine dependence: Secondary | ICD-10-CM | POA: Insufficient documentation

## 2023-04-17 DIAGNOSIS — I10 Essential (primary) hypertension: Secondary | ICD-10-CM | POA: Diagnosis not present

## 2023-04-17 DIAGNOSIS — Z1211 Encounter for screening for malignant neoplasm of colon: Secondary | ICD-10-CM

## 2023-04-17 DIAGNOSIS — I4891 Unspecified atrial fibrillation: Secondary | ICD-10-CM | POA: Insufficient documentation

## 2023-04-17 DIAGNOSIS — K640 First degree hemorrhoids: Secondary | ICD-10-CM

## 2023-04-17 HISTORY — PX: COLONOSCOPY WITH PROPOFOL: SHX5780

## 2023-04-17 SURGERY — COLONOSCOPY WITH PROPOFOL
Anesthesia: General

## 2023-04-17 MED ORDER — LIDOCAINE HCL (CARDIAC) PF 100 MG/5ML IV SOSY
PREFILLED_SYRINGE | INTRAVENOUS | Status: DC | PRN
  Administered 2023-04-17: 60 mg via INTRATRACHEAL

## 2023-04-17 MED ORDER — LACTATED RINGERS IV SOLN: INTRAVENOUS | Status: DC | PRN

## 2023-04-17 MED ORDER — PROPOFOL 500 MG/50ML IV EMUL
INTRAVENOUS | Status: DC | PRN
  Administered 2023-04-17: 150 ug/kg/min via INTRAVENOUS

## 2023-04-17 MED ORDER — LACTATED RINGERS IV SOLN: INTRAVENOUS | Status: DC

## 2023-04-17 MED ORDER — PROPOFOL 10 MG/ML IV BOLUS
INTRAVENOUS | Status: DC | PRN
  Administered 2023-04-17: 100 mg via INTRAVENOUS

## 2023-04-17 MED ORDER — STERILE WATER FOR IRRIGATION IR SOLN
Status: DC | PRN
  Administered 2023-04-17: 60 mL

## 2023-04-17 NOTE — Op Note (Signed)
[facility] Patient Name: Brenda King Procedure Date: 04/17/2023 11:10 AM MRN: [id number] Date of Birth: [birth year] Attending MD: Gennette Pac , MD, [id number] CSN: [id number] Age: 63 Admit Type: Outpatient Procedure:                Colonoscopy Indications:              Screening for colorectal malignant neoplasm Providers:                Gennette Pac, MD, Angelica Ran, Pandora Leiter, Technician Referring MD:              Medicines:                Propofol per Anesthesia Complications:            No immediate complications. Estimated Blood Loss:     Estimated blood loss: none. Procedure:                Pre-Anesthesia Assessment:                           - Prior to the procedure, a History and Physical                            was performed, and patient medications and                            allergies were reviewed. The patient's tolerance of                            previous anesthesia was also reviewed. The risks                            and benefits of the procedure and the sedation                            options and risks were discussed with the patient.                            All questions were answered, and informed consent                            was obtained. Prior Anticoagulants: The patient has                            taken no anticoagulant or antiplatelet agents. ASA                            Grade Assessment: III - A patient with severe                            systemic disease. After reviewing the risks and  benefits, the patient was deemed in satisfactory                            condition to undergo the procedure.                           After obtaining informed consent, the colonoscope                            was passed under direct vision. Throughout the                            procedure, the patient's blood pressure, pulse, and                             oxygen saturations were monitored continuously. The                            [id number]) scope was introduced through the                            anus and advanced to the the cecum, identified by                            appendiceal orifice and ileocecal valve. The                            colonoscopy was performed without difficulty. The                            patient tolerated the procedure well. The quality                            of the bowel preparation was adequate. The                            ileocecal valve, appendiceal orifice, and rectum                            were photographed. Scope In: 11:41:50 AM Scope Out: 11:53:40 AM Scope Withdrawal Time: 0 hours 7 minutes 22 seconds  Total Procedure Duration: 0 hours 11 minutes 50 seconds  Findings:      The perianal and digital rectal examinations were normal.      Many medium-mouthed diverticula were found in the entire colon.      Non-bleeding internal hemorrhoids were found during retroflexion. The       hemorrhoids were mild, small and Grade I (internal hemorrhoids that do       not prolapse).      The exam was otherwise without abnormality on direct and retroflexion       views. Impression:               - Diverticulosis in the entire examined colon.                           -  Non-bleeding internal hemorrhoids.                           - The examination was otherwise normal on direct                            and retroflexion views.                           - No specimens collected. Moderate Sedation:      Moderate (conscious) sedation was personally administered by an       anesthesia professional. The following parameters were monitored: oxygen       saturation, heart rate, blood pressure, respiratory rate, EKG, adequacy       of pulmonary ventilation, and response to care. Recommendation:           - Patient has a contact number available for                            emergencies. The signs  and symptoms of potential                            delayed complications were discussed with the                            patient. Return to normal activities tomorrow.                            Written discharge instructions were provided to the                            patient.                           - Advance diet as tolerated.                           - Continue present medications.                           - Repeat colonoscopy in 10 years for screening                            purposes.                           - Return to GI office in 3 months. Recommend                            increasing fiber in diet. Daily fiber supplement                            which patient can tolerate is advised. Procedure Code(s):        --- Professional ---                           [phone],  Colonoscopy, flexible; diagnostic, including                            collection of specimen(s) by brushing or washing,                            when performed (separate procedure) Diagnosis Code(s):        --- Professional ---                           Z12.11, Encounter for screening for malignant                            neoplasm of colon                           K64.0, First degree hemorrhoids                           K57.30, Diverticulosis of large intestine without                            perforation or abscess without bleeding CPT copyright 2022 American Medical Association. All rights reserved. The codes documented in this report are preliminary and upon coder review may  be revised to meet current compliance requirements. Gerrit Friends. Kimbley Sprague, MD Gennette Pac, MD 04/17/2023 12:03:31 PM This report has been signed electronically. Number of Addenda: 0

## 2023-04-17 NOTE — Anesthesia Preprocedure Evaluation (Signed)
Anesthesia Evaluation  Patient identified by MRN, date of birth, ID band Patient awake    Reviewed: Allergy & Precautions, H&P , NPO status , Patient's Chart, lab work & pertinent test results, reviewed documented beta blocker date and time   Airway Mallampati: II  TM Distance: >3 FB Neck ROM: full    Dental no notable dental hx.    Pulmonary neg pulmonary ROS, former smoker   Pulmonary exam normal breath sounds clear to auscultation       Cardiovascular Exercise Tolerance: Good hypertension, + dysrhythmias Atrial Fibrillation  Rhythm:regular Rate:Normal     Neuro/Psych  Headaches  Anxiety     negative neurological ROS  negative psych ROS   GI/Hepatic negative GI ROS, Neg liver ROS,,,  Endo/Other  negative endocrine ROS    Renal/GU negative Renal ROS  negative genitourinary   Musculoskeletal   Abdominal   Peds  Hematology negative hematology ROS (+)   Anesthesia Other Findings   Reproductive/Obstetrics negative OB ROS                             Anesthesia Physical Anesthesia Plan  ASA: 3  Anesthesia Plan: General   Post-op Pain Management:    Induction:   PONV Risk Score and Plan: Propofol infusion  Airway Management Planned:   Additional Equipment:   Intra-op Plan:   Post-operative Plan:   Informed Consent: I have reviewed the patients History and Physical, chart, labs and discussed the procedure including the risks, benefits and alternatives for the proposed anesthesia with the patient or authorized representative who has indicated his/her understanding and acceptance.     Dental Advisory Given  Plan Discussed with: CRNA  Anesthesia Plan Comments:        Anesthesia Quick Evaluation

## 2023-04-17 NOTE — H&P (Signed)
@LOGO @   Primary Care Physician:  Tommie Sams, DO Primary Gastroenterologist:  Dr. Jena Gauss  Pre-Procedure History & Physical: HPI:  Brenda King is a 63 y.o. female here for  screening colonoscopy.  Colonoscopy -10 years ago.  Some change in caliber stool.  Chronically constipated.  Allergic to PEG.  Did well with Clenpiq  Past Medical History:  Diagnosis Date   Anxiety    Atrial fibrillation [facility])    Status post ablation 2004 and 2005 - Dr. Sampson [facility])   Constipation    Degenerative disc disease, cervical    Menopause    Nodular infiltrative basal cell carcinoma (BCC) 07/04/2021   Left Shoulder    Past Surgical History:  Procedure Laterality Date   ABDOMINAL HYSTERECTOMY     BREAST ENHANCEMENT SURGERY  2001   BUNIONECTOMY Right    CARDIAC ELECTROPHYSIOLOGY MAPPING AND ABLATION     CYSTOSCOPY W/ RETROGRADES Bilateral 04/17/2016   Procedure: CYSTOSCOPY WITH BILATERAL RETROGRADE PYELOGRAMS;  Surgeon: Malen Gauze, MD;  Location: [facility];  Service: Urology;  Laterality: Bilateral;   PARTIAL HYSTERECTOMY  2000   PLANTAR FASCIECTOMY Right    SHOULDER ARTHROSCOPY     frozen shoulder   SKIN CANCER EXCISION Left    left shoulder    Prior to Admission medications   Medication Sig Start Date End Date Taking? Authorizing Provider  ALPRAZolam Prudy Feeler) 0.5 MG tablet Take 0.5 mg by mouth as needed. "Taking as needed related to menopause symptoms" 08/17/20  Yes [provider]  aspirin EC 81 MG tablet Take 81 mg by mouth daily. Swallow whole.   Yes [provider]  cyanocobalamin (VITAMIN B12) 1000 MCG tablet Take 1,000 mcg by mouth daily.   Yes [provider]  EQL EVENING PRIMROSE OIL PO Take by mouth.   Yes [provider]  escitalopram (LEXAPRO) 10 MG tablet Take 10 mg by mouth daily. 02/25/19  Yes [provider]  estradiol (ESTRACE) 2 MG tablet Take 2 mg by mouth daily.   Yes [provider]  progesterone  (PROMETRIUM) 100 MG capsule Take 100 mg by mouth at bedtime. 06/30/20  Yes [provider]  Sod Picosulfate-Mag Ox-Cit Acd (CLENPIQ) 10-3.5-12 MG-GM -GM/175ML SOLN Take 1 kit by mouth as directed. 03/28/23  Yes Kord Monette, Gerrit Friends, MD  Aspirin-Acetaminophen-Caffeine (EXCEDRIN MIGRAINE PO) Take by mouth as needed.    [provider]  Rimegepant Sulfate (NURTEC) 75 MG TBDP Take 1 tablet (75 mg total) by mouth daily as needed. For migraines. Take as close to onset of migraine as possible. One daily maximum. Please use copay card: BIN [id number] PCN CN GRP [id number] ID [id number] 01/04/23   Naomie Dean B, MD  tretinoin (RETIN-A) 0.05 % cream SMARTSIG:sparingly Topical Every Night PRN 02/19/23   [provider]    Allergies as of 03/28/2023 - Review Complete 03/25/2023  Allergen Reaction Noted   Docusate sodium Hives and Shortness Of Breath 02/04/2013   Miralax [polyethylene glycol] Hives and Shortness Of Breath 07/09/2012   Other Hives 07/01/2013   Bisacodyl Other (See Comments) 07/04/2021   Chlorzoxazone  09/26/2014   Golytely [peg 3350-kcl-nabcb-nacl-nasulf]  03/25/2023   Latex Itching 04/11/2016   Topamax [topiramate]  07/10/2018    Family History  Problem Relation Age of Onset   CAD Father    Heart disease Father    CAD Brother    Throat cancer Maternal Grandfather    Cancer Other        breast   Cancer -  Colon Maternal Uncle    Colon cancer Neg Hx     Social History   Socioeconomic History   Marital status: Married    Spouse name: Not on file   Number of children: Not on file   Years of education: Not on file   Highest education level: Not on file  Occupational History   Not on file  Tobacco Use   Smoking status: Former    Current packs/day: 0.25    Average packs/day: 0.3 packs/day for 30.0 years (7.5 ttl pk-yrs)    Types: Cigarettes   Smokeless tobacco: Former    Quit date: 11/04/2012  Vaping Use   Vaping status: Every Day  Substance and Sexual  Activity   Alcohol use: No    Alcohol/week: 0.0 standard drinks of alcohol   Drug use: No   Sexual activity: Yes    Birth control/protection: Surgical  Other Topics Concern   Not on file  Social History Narrative   Not on file   Social Drivers of Health   Financial Resource Strain: Not on file  Food Insecurity: Not on file  Transportation Needs: Not on file  Physical Activity: Not on file  Stress: Not on file (01/23/2023)  Social Connections: Not on file  Intimate Partner Violence: Not on file    Review of Systems: See HPI, otherwise negative ROS  Physical Exam: Pulse 75   Temp 98.3 F (36.8 C) (Oral)   Resp 16   Ht 5' 9.5" (1.765 m)   Wt 77.1 kg   SpO2 93%   BMI 24.74 kg/m  General:   Alert,  Well-developed, well-nourished, pleasant and cooperative in NAD Lungs:  Clear throughout to auscultation.   No wheezes, crackles, or rhonchi. No acute distress. Heart:  Regular rate and rhythm; no murmurs, clicks, rubs,  or gallops. Abdomen: Non-distended, normal bowel sounds.  Soft and nontender without appreciable mass or hepatosplenomegaly.  Impression/Plan:   63 year old lady here for a screening colonoscopy.  The risks, benefits, limitations, alternatives and imponderables have been reviewed with the patient. Questions have been answered. All parties are agreeable.       Notice: This dictation was prepared with Dragon dictation along with smaller phrase technology. Any transcriptional errors that result from this process are unintentional and may not be corrected upon review.

## 2023-04-17 NOTE — Transfer of Care (Signed)
Immediate Anesthesia Transfer of Care Note  Patient: Brenda King [facility]  Procedure(s) Performed: COLONOSCOPY WITH PROPOFOL  Patient Location: Endoscopy Unit  Anesthesia Type:General  Level of Consciousness: awake, alert , and oriented  Airway & Oxygen Therapy: Patient Spontanous Breathing  Post-op Assessment: Report given to RN and Post -op Vital signs reviewed and stable  Post vital signs: Reviewed and stable  Last Vitals:  Vitals Value Taken Time  BP 89/51 04/17/23 1202  Temp 36.6 C 04/17/23 1156  Pulse 69 04/17/23 1202  Resp 20 04/17/23 1202  SpO2 97 % 04/17/23 1202    Last Pain:  Vitals:   04/17/23 1202  TempSrc:   PainSc: 0-No pain      Patients Stated Pain Goal: 4 (04/17/23 1128)  Complications: No notable events documented.

## 2023-04-17 NOTE — Discharge Instructions (Addendum)
  Colonoscopy Discharge Instructions  Read the instructions outlined below and refer to this sheet in the next few weeks. These discharge instructions provide you with general information on caring for yourself after you leave the hospital. Your doctor may also give you specific instructions. While your treatment has been planned according to the most current medical practices available, unavoidable complications occasionally occur. If you have any problems or questions after discharge, call Dr. Jena Gauss at [phone]. ACTIVITY You may resume your regular activity, but move at a slower pace for the next 24 hours.  Take frequent rest periods for the next 24 hours.  Walking will help get rid of the air and reduce the bloated feeling in your belly (abdomen).  No driving for 24 hours (because of the medicine (anesthesia) used during the test).   Do not sign any important legal documents or operate any machinery for 24 hours (because of the anesthesia used during the test).  NUTRITION Drink plenty of fluids.  You may resume your normal diet as instructed by your doctor.  Begin with a light meal and progress to your normal diet. Heavy or fried foods are harder to digest and may make you feel sick to your stomach (nauseated).  Avoid alcoholic beverages for 24 hours or as instructed.  MEDICATIONS You may resume your normal medications unless your doctor tells you otherwise.  WHAT YOU CAN EXPECT TODAY Some feelings of bloating in the abdomen.  Passage of more gas than usual.  Spotting of blood in your stool or on the toilet paper.  IF YOU HAD POLYPS REMOVED DURING THE COLONOSCOPY: No aspirin products for 7 days or as instructed.  No alcohol for 7 days or as instructed.  Eat a soft diet for the next 24 hours.  FINDING OUT THE RESULTS OF YOUR TEST Not all test results are available during your visit. If your test results are not back during the visit, make an appointment with your caregiver to find out the  results. Do not assume everything is normal if you have not heard from your caregiver or the medical facility. It is important for you to follow up on all of your test results.  SEEK IMMEDIATE MEDICAL ATTENTION IF: You have more than a spotting of blood in your stool.  Your belly is swollen (abdominal distention).  You are nauseated or vomiting.  You have a temperature over 101.  You have abdominal pain or discomfort that is severe or gets worse throughout the day.     You have extensive diverticulosis throughout your colon but no polyps  Diverticulosis and constipation information provided  Add a daily fiber supplement to your regimen.  Take it every day.  Once to consider include Benefiber, Citrucel or Metamucil (  Which ever one you can tolerate the best)   repeat colonoscopy for screening purposes in 10 years    Office visit with Tana Coast in 3 months  At patient request, I called Abaigeal Moomaw at [phone] -  call rolled to voicemail.  Left a message.

## 2023-04-18 ENCOUNTER — Encounter (HOSPITAL_COMMUNITY): Payer: Self-pay | Admitting: Internal Medicine

## 2023-04-19 NOTE — Anesthesia Postprocedure Evaluation (Signed)
Anesthesia Post Note  Patient: Brenda King [facility]  Procedure(s) Performed: COLONOSCOPY WITH PROPOFOL  Patient location during evaluation: Phase II Anesthesia Type: General Level of consciousness: awake Pain management: pain level controlled Vital Signs Assessment: post-procedure vital signs reviewed and stable Respiratory status: spontaneous breathing and respiratory function stable Cardiovascular status: blood pressure returned to baseline and stable Postop Assessment: no headache and no apparent nausea or vomiting Anesthetic complications: no Comments: Late entry   No notable events documented.   Last Vitals:  Vitals:   04/17/23 1156 04/17/23 1202  BP: (!) 89/58 (!) 89/51  Pulse: 65 69  Resp: 18 20  Temp: 36.6 C   SpO2: 96% 97%    Last Pain:  Vitals:   04/17/23 1202  TempSrc:   PainSc: 0-No pain                 Windell Norfolk

## 2023-04-23 ENCOUNTER — Telehealth: Payer: Self-pay | Admitting: Podiatry

## 2023-04-23 NOTE — Telephone Encounter (Signed)
 Received STD accommodation paperwork from [place] -- patient dropped off at front desk ....   Per doctors note dated 03/31/2023, patient is able to return to work (WITHOUT restriction) on 04/02/2023 ....   Called patient 04/21/2023 & 04/22/2023 and LMVM to discuss paperwork ....   Called again this morning and again LMVM to discusss paperwork received ....     J. Abbott -- 04/23/2023

## 2023-04-30 ENCOUNTER — Other Ambulatory Visit (HOSPITAL_COMMUNITY): Payer: Self-pay

## 2023-04-30 ENCOUNTER — Telehealth: Payer: Self-pay

## 2023-04-30 NOTE — Telephone Encounter (Signed)
*  [facility]  Pharmacy Patient Advocate Encounter   Received notification from CoverMyMeds that prior authorization for Qulipta 60MG  tablets  is required/requested.   Insurance verification completed.   The patient is insured through [employer] [facility] .   Per test claim: PA required; PA submitted to above mentioned insurance via CoverMyMeds Key/confirmation #/EOC [id number] Status is pending

## 2023-05-05 NOTE — Telephone Encounter (Signed)
I called and LMVM for pt that needing to know if qulipta was helping (reduction in # migraines since baseline).  Let us know for insurance PA via phone or mychart message.

## 2023-05-05 NOTE — Telephone Encounter (Signed)
Prior Authorization form/request asks a question that requires your assistance. Please see the question below and advise accordingly. The PA will not be submitted until the necessary information is received.   Has the patient had a reduction in migraine days per month from baseline?

## 2023-05-06 NOTE — Telephone Encounter (Signed)
Second attempt, called pt and LVM (ok per DPR) asking her to give [place] a call back or send mychart message letting us know if she's had a reduction in the number of migraine days per month from baseline since starting [place]. I also left in message that if we don't hear back from her before her appt next month on 3/12, we will wait and gather that information and document then.

## 2023-05-07 ENCOUNTER — Telehealth: Payer: Self-pay | Admitting: Podiatry

## 2023-05-07 ENCOUNTER — Ambulatory Visit: Payer: BLUE CROSS/BLUE SHIELD | Admitting: Podiatry

## 2023-05-07 NOTE — Telephone Encounter (Signed)
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   Per letter from provider, patient can return to work without restrictions.  Called patient to confirm paperwork -- LMVM.  Last appointment was 02/09/2023.  ** See 02/03 notes -- multiple messages left for patient ......     J. Abbott -- 05/07/2023

## 2023-05-07 NOTE — Telephone Encounter (Signed)
I placed Qulipta on pt's allergy list and confirmed it was taken off her medication list.

## 2023-05-12 ENCOUNTER — Ambulatory Visit (INDEPENDENT_AMBULATORY_CARE_PROVIDER_SITE_OTHER): Payer: BLUE CROSS/BLUE SHIELD | Admitting: Podiatry

## 2023-05-12 ENCOUNTER — Encounter: Payer: Self-pay | Admitting: Podiatry

## 2023-05-12 DIAGNOSIS — M722 Plantar fascial fibromatosis: Secondary | ICD-10-CM | POA: Diagnosis not present

## 2023-05-12 DIAGNOSIS — M7751 Other enthesopathy of right foot: Secondary | ICD-10-CM

## 2023-05-12 MED ORDER — GABAPENTIN 400 MG PO CAPS
400.0000 mg | ORAL_CAPSULE | Freq: Three times a day (TID) | ORAL | 3 refills | Status: DC
Start: 2023-05-12 — End: 2023-11-26

## 2023-05-12 MED ORDER — TRIAMCINOLONE ACETONIDE 10 MG/ML IJ SUSP
10.0000 mg | Freq: Once | INTRAMUSCULAR | Status: AC
  Administered 2023-05-12: 10 mg via INTRA_ARTICULAR

## 2023-05-12 NOTE — Progress Notes (Signed)
 Subjective:   Patient ID: Brenda King, female   DOB: 63 y.o.   MRN: [id number]   HPI Patient presents stating that the feet have really started to hurt her again and also she is starting to get a lot of nerve and tingling pain and is seeing a neurosurgeon on Wednesday for that   ROS      Objective:  Physical Exam  Neurovascular status intact inflammation fluid of the mid arch left with a nodular formation of the fascia and the right arch hurts but it is more also into the sinus tarsi with inflammatory inflammation.  Also complaining of nerve irritation     Assessment:  Sinus tarsitis right fluid buildup within the sinus tarsi painful plantar fasciitis left foot plantar fibroma possibility for neuropathic condition     Plan:  H&P reviewed everything with her and her husband.  We are going to try her on gabapentin 400 mg and I want her just to take 1 at nighttime and if she tolerates well after several weeks she can add 1 in the morning and midday.  I did do sterile prep I injected the sinus tarsi right 3 mg Kenalog 5 mg Xylocaine and I injected the mid arch left 3 mg Dexasone Kenalog 5 mg Xylocaine applied sterile dressing.  Reappoint to recheck in the next 3 to 4 weeks

## 2023-05-28 ENCOUNTER — Ambulatory Visit (INDEPENDENT_AMBULATORY_CARE_PROVIDER_SITE_OTHER): Payer: BLUE CROSS/BLUE SHIELD | Admitting: Neurology

## 2023-05-28 ENCOUNTER — Encounter: Payer: Self-pay | Admitting: Neurology

## 2023-05-28 VITALS — BP 109/63 | HR 69 | Ht 69.0 in | Wt 166.2 lb

## 2023-05-28 DIAGNOSIS — G43101 Migraine with aura, not intractable, with status migrainosus: Secondary | ICD-10-CM

## 2023-05-28 DIAGNOSIS — M7918 Myalgia, other site: Secondary | ICD-10-CM

## 2023-05-28 DIAGNOSIS — M5481 Occipital neuralgia: Secondary | ICD-10-CM | POA: Diagnosis not present

## 2023-05-28 MED ORDER — LIDOCAINE 5 % EX PTCH: 3.0000 | MEDICATED_PATCH | CUTANEOUS | 11 refills | Status: DC

## 2023-05-28 NOTE — Progress Notes (Signed)
 [facility]    Provider:  Dr Lucia Gaskins Requesting Provider: Tommie Sams, DO Primary Care Provider:  Tommie Sams, DO  CC:  Migraines  May 28, 2023: We have seen patient in the past for migraines and started her on Nurtec which has been very effective for patient.  Today she is here for new symptoms, her husband is a physician and he was concerned. The nurtec is extremely helpful. She has back and neck pain and she is getting imaging of her cervical spine and low back. She has fibromas in her feet and they hurt as well she is getting shots and on gabapentin. She is having pain in the back of the ear, sensitive to the touch and sharp pain behind the right ear. Very tight muscles. Occipital neuralgia pain. Has 4 migraine days a month and < 8 total headache days a month  FINDINGS: reviewed images The brain parenchyma shows few nonspecific periventricular and subcortical T2/white matter hyperintensities likely due to age-related small vessel disease.  No structural lesion, tumor or infarct is noted.  Diffusion-weighted imaging is negative for acute ischemia.  GRE sequences are negative for microhemorrhages.  The pituitary gland and cerebellar tonsils appear normal.  Subarachnoid spaces and ventricular system appear normal.  Cortical sulci and gyri show normal appearance.  Calvarium shows no abnormalities.  Extra-axial brain structures appear normal.  Orbits appear unremarkable.  Paranasal sinuses show no significant abnormalities.  The pituitary gland and cerebellar tonsils appear normal.  The visualized portion of the upper cervical spine show no significant abnormalities.  Flow-voids of large vessels of intracranial circulation appear to be patent.  Postcontrast images do not result in abnormal areas of enhancement.         IMPRESSION: Unremarkable MRI scan of the brain with and without contrast.  Patient complains of symptoms per HPI as well as the following symptoms: none .  Pertinent negatives and positives per HPI. All others negative  HPI:  Brenda King is a 63 y.o. female here as requested by Tommie Sams, DO for migraines. has Allergic rhinitis; Chronic idiopathic constipation; Migraine without aura and without status migrainosus, not intractable; Midline low back pain without sciatica; Worsening headaches; Respiratory illness; and Bowel habit changes on their problem list.  She is in the begginning stages. Starts drooping on the right. She has righ-sided pain, swelling above the eye, In the last month she has taken 30 rizatriptan. She is also taking  a lot of butalbital. Pulsating/pounding/throbbing/light sensitivity has to go into her room in a drak quiet place for 2-3 days. Husband is here and provides much information. She has had severe migraines for 7 days. She has had rebound due ot medication overuse for severe migraine. She has been suffering years and worsening in the last 6 months. She sleeps on a heating pad every night. Sever muscle pain in the trapezius. Nausea as well. Had an accident in feb 2023 and she went to urgent care and migraines worsened. When the migraine  and when is in force she has severe neck myofascial pain. No vomiting but extreme nausea. In the last 3 months she has 7  severe migraines and 14 total severe headache days a month. She has vision changes. Morning and positional headaches. She see shooting stars. Every now and then she sees it with the migraines. She is suffering, her husband is very concerned, this has been ongoing and getting worse. Affecting life. No other focal neurologic deficits, associated symptoms, inciting events  or modifiable factors.   Reviewed notes, labs and imaging from outside physicians, which showed: Medications tried for migraines > 3 months: Cannot take amitriptyline because on lexapro and risk of seratonin syndrome, tried Nortriptyline, topiramate(significant side effects necessitating termination of  medication), cannot take blood pressure medications due to hypotension, imitrex, rizatriptan, gabapentin, CANNOT TAKE AJOVY/QULIPA or AIMOVIG DUE TO SEVERE ALLERGIC REACTION TO INGREDIENT POLYSORBATE   No results found for this or any previous visit (from the past 2160 hours).    Review of Systems: Patient complains of symptoms per HPI as well as the following symptoms insomnia, neck pain. Pertinent negatives and positives per HPI. All others negative.   Social History   Socioeconomic History   Marital status: Married    Spouse name: Not on file   Number of children: Not on file   Years of education: Not on file   Highest education level: Not on file  Occupational History   Not on file  Tobacco Use   Smoking status: Former    Current packs/day: 0.25    Average packs/day: 0.3 packs/day for 30.0 years (7.5 ttl pk-yrs)    Types: Cigarettes   Smokeless tobacco: Former    Quit date: 11/04/2012  Vaping Use   Vaping status: Every Day  Substance and Sexual Activity   Alcohol use: No    Alcohol/week: 0.0 standard drinks of alcohol   Drug use: No   Sexual activity: Yes    Birth control/protection: Surgical  Other Topics Concern   Not on file  Social History Narrative   Not on file   Social Drivers of Health   Financial Resource Strain: Not on file  Food Insecurity: Not on file  Transportation Needs: Not on file  Physical Activity: Not on file  Stress: Not on file (01/23/2023)  Social Connections: Not on file  Intimate Partner Violence: Not on file    Family History  Problem Relation Age of Onset   CAD Father    Heart disease Father    CAD Brother    Throat cancer Maternal Grandfather    Cancer Other        breast   Cancer - Colon Maternal Uncle    Colon cancer Neg Hx     Past Medical History:  Diagnosis Date   Anxiety    Atrial fibrillation (HCC)    Status post ablation 2004 and 2005 - Dr. Sampson Goon [facility])   Constipation    Degenerative disc disease, cervical     Menopause    Nodular infiltrative basal cell carcinoma (BCC) 07/04/2021   Left Shoulder    Patient Active Problem List   Diagnosis Date Noted   Bowel habit changes 03/25/2023   Respiratory illness 02/23/2023   Worsening headaches 01/04/2023   Midline low back pain without sciatica 10/02/2022   Migraine without aura and without status migrainosus, not intractable 06/03/2017   Chronic idiopathic constipation 02/04/2013   Allergic rhinitis 07/09/2012    Past Surgical History:  Procedure Laterality Date   ABDOMINAL HYSTERECTOMY     BREAST ENHANCEMENT SURGERY  2001   BUNIONECTOMY Right    CARDIAC ELECTROPHYSIOLOGY MAPPING AND ABLATION     COLONOSCOPY WITH PROPOFOL N/A 04/17/2023   Procedure: COLONOSCOPY WITH PROPOFOL;  Surgeon: Corbin Ade, MD;  Location: [facility];  Service: Endoscopy;  Laterality: N/A;  12:30pm, asa 2   CYSTOSCOPY W/ RETROGRADES Bilateral 04/17/2016   Procedure: CYSTOSCOPY WITH BILATERAL RETROGRADE PYELOGRAMS;  Surgeon: Malen Gauze, MD;  Location: [facility] ORS;  Service: Urology;  Laterality: Bilateral;   PARTIAL HYSTERECTOMY  2000   PLANTAR FASCIECTOMY Right    SHOULDER ARTHROSCOPY     frozen shoulder   SKIN CANCER EXCISION Left    left shoulder    Current Outpatient Medications  Medication Sig Dispense Refill   ALPRAZolam (XANAX) 0.5 MG tablet Take 0.5 mg by mouth as needed. "Taking as needed related to menopause symptoms"     aspirin EC 81 MG tablet Take 81 mg by mouth daily. Swallow whole.     EQL EVENING PRIMROSE OIL PO Take by mouth.     escitalopram (LEXAPRO) 10 MG tablet Take 10 mg by mouth daily.     estradiol (ESTRACE) 2 MG tablet Take 2 mg by mouth daily.     gabapentin (NEURONTIN) 400 MG capsule Take 1 capsule (400 mg total) by mouth 3 (three) times daily. 90 capsule 3   lidocaine (LIDODERM) 5 % Place 3 patches onto the skin daily. Remove & Discard patch within 12 hours or as directed by MD 90 patch 11   progesterone (PROMETRIUM) 100 MG  capsule Take 100 mg by mouth at bedtime.     Aspirin-Acetaminophen-Caffeine (EXCEDRIN MIGRAINE PO) Take by mouth as needed. (Patient not taking: Reported on 05/28/2023)     cyanocobalamin (VITAMIN B12) 1000 MCG tablet Take 1,000 mcg by mouth daily.     Rimegepant Sulfate (NURTEC) 75 MG TBDP Take 1 tablet (75 mg total) by mouth daily as needed. For migraines. Take as close to onset of migraine as possible. One daily maximum. Please use copay card: BIN [id number] PCN CN GRP [id number] ID [id number] EXP 03/17/2024 16 tablet 11   Sod Picosulfate-Mag Ox-Cit Acd (CLENPIQ) 10-3.5-12 MG-GM -GM/175ML SOLN Take 1 kit by mouth as directed. 350 mL 0   tretinoin (RETIN-A) 0.05 % cream SMARTSIG:sparingly Topical Every Night PRN (Patient not taking: Reported on 05/28/2023)     No current facility-administered medications for this visit.    Allergies as of 05/28/2023 - Review Complete 05/28/2023  Allergen Reaction Noted   Docusate sodium Hives and Shortness Of Breath 02/04/2013   Miralax [polyethylene glycol] Hives and Shortness Of Breath 07/09/2012   Other Hives 07/01/2013   Bisacodyl Other (See Comments) 07/04/2021   Chlorzoxazone  09/26/2014   Golytely [peg 3350-kcl-nabcb-nacl-nasulf]  03/25/2023   Latex Itching 04/11/2016   Qulipta [atogepant]  05/07/2023   Topamax [topiramate]  07/10/2018    Vitals: BP 109/63 (BP Location: Right Arm, Patient Position: Sitting, Cuff Size: Normal)   Pulse 69   Ht 5\' 9"  (1.753 m)   Wt 166 lb 3.2 oz (75.4 kg)   BMI 24.54 kg/m  Last Weight:  Wt Readings from Last 1 Encounters:  05/28/23 166 lb 3.2 oz (75.4 kg)   Last Height:   Ht Readings from Last 1 Encounters:  05/28/23 5\' 9"  (1.753 m)   Physical exam: Exam: Gen: NAD, conversant, well nourised, obese, well groomed                     CV: RRR, no MRG. No Carotid Bruits. No peripheral edema, warm, nontender Eyes: Conjunctivae clear without exudates or hemorrhage  Neuro: Detailed Neurologic Exam  Speech:     Speech is normal; fluent and spontaneous with normal comprehension.  Cognition:    The patient is oriented to person, place, and time;     recent and remote memory intact;     language fluent;     normal attention, concentration,     fund  of knowledge Cranial Nerves:    The pupils are equal, round, and reactive to light. The fundi are normal and spontaneous venous pulsations are present. Visual fields are full to finger confrontation. Extraocular movements are intact. Trigeminal sensation is intact and the muscles of mastication are normal. The face is symmetric. The palate elevates in the midline. Hearing intact. Voice is normal. Shoulder shrug is normal. The tongue has normal motion without fasciculations.   Coordination: nml  Gait: nml  Motor Observation:    No asymmetry, no atrophy, and no involuntary movements noted. Tone:    Normal muscle tone.    Posture:    Posture is normal. normal erect    Strength:    Strength is V/V in the upper and lower limbs.      Sensation: intact to LT     Reflex Exam:  DTR's:    Deep tendon reflexes in the upper and lower extremities are normal bilaterally.   Toes:    The toes are downgoing bilaterally.   Clonus:    Clonus is absent.     Assessment/Plan:  Patient with migraine and cervical myofascial pain (very tight cervical muscles and likely occipital neuralgia:  occipital neuralgia and neck pain with movement. Neuro exam is non focal. Pain is right sided and is located in the distribution of the greater, lesser and/or third occipital nerves, paroxysmal and brief, painful, sharp, with tenderness and trigger points at the emergence of the greater and lesser occipital nerve. Has 4-5 migraine days a month and < 8 total headache days a month  Occipital nerve block and trigger point injectons: Dr. Ihor Austin. Benjamin Stain, MD, ABFM, CAQSM, AME: Occipital nerve blocks and trigger points  Pending mri c-spine with her spine doctor  Will refer  to Dr. Karie Schwalbe above You can make appointment with [email or web address] as well for dry needling although Dr. Karie Schwalbe may do that  Can we consider botox in the future? Look for polysorbate. Refill nurtec  Meds ordered this encounter  Medications   lidocaine (LIDODERM) 5 %    Sig: Place 3 patches onto the skin daily. Remove & Discard patch within 12 hours or as directed by MD    Dispense:  90 patch    Refill:  11   Rimegepant Sulfate (NURTEC) 75 MG TBDP    Sig: Take 1 tablet (75 mg total) by mouth daily as needed. For migraines. Take as close to onset of migraine as possible. One daily maximum. Please use copay card: BIN [id number] PCN CN GRP [id number] ID [id number] EXP 03/17/2024    Dispense:  16 tablet    Refill:  11    Please use copay card: BIN [id number] PCN CN GRP [id number] ID [id number] EXP 03/17/2024   Orders Placed This Encounter  Procedures   AMB referral to sports medicine     Options discussed:  - Consider Occipital nerve blocks -Trigger point injections/dry needling - PT (or [email or web address])  -Lidocaine patches -  -Gabapentin prn or Lyrica or medical management like muscle relaxers(ie flexeril or others), watch for sedation -Heating pad -Physical Therapy: Cervical myofascial pain, forward posture contributing to occipital neuralgia and cervicalgia. Please evaluate and treat including dry needling, stretching, strengthening, manual therapy/massage, heating, TENS unit, exercising for scapular stabilization, pectoral stretching and rhomboid strengthening as clinically warranted as well as any other modality as recommended by evaluation. - Consider medial branch blocks at c2-c3 or epidural steroid injections into the spine(Dr. Dutch Quint) -Differential: Occipital neuralgia, Cervical radiculopathy or arthritis in the neck  or muscle spasms    Reviewed notes, labs and imaging from outside physicians, which showed: Medications tried for migraines > 3 months: Cannot take amitriptyline because on lexapro  and risk of seratonin syndrome, tried Nortriptyline, topiramate(significant side effects necessitating termination of medication), cannot take blood pressure medications due to hypotension, imitrex, rizatriptan, gabapentin, CANNOT TAKE AJOVY/QULIPA or AIMOVIG DUE TO SEVERE ALLERGIC REACTION TO INGREDIENT POLYSORBATE      Orders Placed This Encounter  Procedures   AMB referral to sports medicine   Meds ordered this encounter  Medications   lidocaine (LIDODERM) 5 %    Sig: Place 3 patches onto the skin daily. Remove & Discard patch within 12 hours or as directed by MD    Dispense:  90 patch    Refill:  11   Rimegepant Sulfate (NURTEC) 75 MG TBDP    Sig: Take 1 tablet (75 mg total) by mouth daily as needed. For migraines. Take as close to onset of migraine as possible. One daily maximum. Please use copay card: BIN [id number] PCN CN GRP [id number] ID [id number] EXP 03/17/2024    Dispense:  16 tablet    Refill:  11    Please use copay card: BIN [id number] PCN CN GRP [id number] ID [id number] EXP 03/17/2024    Cc: Shona Simpson,  Tommie Sams, DO  Naomie Dean, MD  [facility] [address] [place], [place] [address]  Phone [phone] Fax [phone]  I spent over 65 minutes of face-to-face and non-face-to-face time with patient on the  1. Occipital neuralgia of right side   2. Cervical myofascial pain syndrome   3. Migraine with aura and with status migrainosus, not intractable     diagnosis.  This included previsit chart review, lab review, study review, order entry, electronic health record documentation, patient education on the different diagnostic and therapeutic options, counseling and coordination of care, risks and benefits of management, compliance, or risk factor reduction  I spent over 45 minutes of face-to-face and non-face-to-face time with patient on the  1. Occipital neuralgia of right side   2. Cervical myofascial pain syndrome    3. Migraine with aura and with status migrainosus, not intractable    diagnosis.  This included previsit chart review, lab review, study review, order entry, electronic health record documentation, patient education on the different diagnostic and therapeutic options, counseling and coordination of care, risks and benefits of management, compliance, or risk factor reduction

## 2023-05-28 NOTE — Patient Instructions (Addendum)
 Dr. Ihor Austin. Benjamin Stain, MD, ABFM, CAQSM, AME: Occipital nerve blocks and trigger points Karin Golden at [email or web address] for dry needling   Will refer to Dr. Aretha Parrot can make appointment with [email or web address]  Can we do botox? Look for polysorbate. Refill nurtec  Options discussed:  - Consider Occipital nerve blocks -Trigger point injections/dry needling - PT (or [email or web address])  -Lidocaine patches -  -Gabapentin prn or Lyrica or medical management like muscle relaxers(ie flexeril or others), watch for sedation -Heating pad -Physical Therapy: Cervical myofascial pain, forward posture contributing to occipital neuralgia and cervicalgia. Please evaluate and treat including dry needling, stretching, strengthening, manual therapy/massage, heating, TENS unit, exercising for scapular stabilization, pectoral stretching and rhomboid strengthening as clinically warranted as well as any other modality as recommended by evaluation. - Consider medial branch blocks at c2-c3 or epidural steroid injections into the spine(Dr. Dutch Quint) -Differential: Occipital neuralgia, Cervical radiculopathy or arthritis in the neck or muscle spasms         Occipital neuralgia  Occipital Neuralgia  Occipital neuralgia is a type of headache that causes brief episodes of very bad pain in the back of the head. Pain from occipital neuralgia may spread (radiate) to other parts of the head. These headaches may be caused by irritation of the nerves that leave the spinal cord high up in the neck, just below the base of the skull (occipital nerves). The occipital nerves transmit sensations from the back of the head, the top of the head, and the areas behind the ears. What are the causes? This condition can occur without any known cause (primary headache syndrome). In other cases, this condition is caused by pressure on or irritation of one of the two occipital nerves. Pressure and irritation may be due to: Muscle spasm in the  neck. Neck injury. Wear and tear of the vertebrae in the neck (osteoarthritis). Disease of the disks that separate the vertebrae. Swollen blood vessels that put pressure on the occipital nerves. Infections. Tumors. Diabetes. What are the signs or symptoms? This condition causes brief burning, stabbing, electric, shocking, or shooting pain in the back of the head that can radiate to the top of the head. It can happen on one side or both sides of the head. It can also cause: Pain behind the eye. Pain triggered by neck movement or hair brushing. Scalp tenderness. Aching in the back of the head between episodes of very bad pain. Pain that gets worse with exposure to bright lights. How is this diagnosed? Your health care provider may diagnose the condition based on a physical exam and your symptoms. Tests may be done, such as: Imaging studies of the brain and neck (cervical spine), such as an MRI or CT scan. These look for causes of pinched nerves. Applying pressure to the nerves in the neck to try to re-create the pain. Injection of numbing medicine into the occipital nerve areas to see if pain goes away (diagnostic nerve block). How is this treated? Treatment for this condition may begin with simple measures, such as: Rest. Massage. Applying heat or cold to the area. Over-the-counter pain relievers. If these measures do not work, you may need other treatments, including: Medicines, such as: Prescription-strength anti-inflammatory medicines. Muscle relaxants. Anti-seizure medicines, which can relieve pain. Antidepressants, which can relieve pain. Injected medicines, such as medicines that numb the area (local anesthetic) and steroids. Pulsed radiofrequency ablation. This is when wires are implanted to deliver electrical impulses that block pain signals from the occipital nerve. Surgery to relieve  nerve pressure. Physical therapy. Follow these instructions at home: Managing pain      Avoid any activities that cause pain. Rest when you have an attack of pain. Try gentle massage to relieve pain. Try a different pillow or sleeping position. If directed, apply heat to the affected area as often as told by your health care provider. Use the heat source that your health care provider recommends, such as a moist heat pack or a heating pad. Place a towel between your skin and the heat source. Leave the heat on for 20-30 minutes. Remove the heat if your skin turns bright red. This is especially important if you are unable to feel pain, heat, or cold. You have a greater risk of getting burned. If directed, put ice on the back of your head and neck area. To do this: Put ice in a plastic bag. Place a towel between your skin and the bag. Leave the ice on for 20 minutes, 2-3 times a day. Remove the ice if your skin turns bright red. This is very important. If you cannot feel pain, heat, or cold, you have a greater risk of damage to the area. General instructions Take over-the-counter and prescription medicines only as told by your health care provider. Avoid things that make your symptoms worse, such as bright lights. Try to stay active. Get regular exercise that does not cause pain. Ask your health care provider to suggest safe exercises for you. Work with a physical therapist to learn stretching exercises you can do at home. Practice good posture. Keep all follow-up visits. This is important. Contact a health care provider if: Your medicine is not working. You have new or worsening symptoms. Get help right away if: You have very bad head pain that does not go away. You have a sudden change in vision, balance, or speech. These symptoms may represent a serious problem that is an emergency. Do not wait to see if the symptoms will go away. Get medical help right away. Call your local emergency services (911 in the [place] Do not drive yourself to the hospital. Summary Occipital  neuralgia is a type of headache that causes brief episodes of very bad pain in the back of the head. Pain from occipital neuralgia may spread (radiate) to other parts of the head. Treatment for this condition includes rest, massage, and medicines. This information is not intended to replace advice given to you by your health care provider. Make sure you discuss any questions you have with your health care provider. Document Revised: 01/02/2020 Document Reviewed: 01/02/2020 Elsevier Patient Education  2023 Else[employer] Inc.  Gabapentin Capsules or Tablets What is this medication? GABAPENTIN (GA ba pen tin) treats nerve pain. It may also be used to prevent and control seizures in people with epilepsy. It works by calming overactive nerves in your body. This medicine may be used for other purposes; ask your health care provider or pharmacist if you have questions. COMMON BRAND NAME(S): Active-PAC with Gabapentin, Ascencion Dike, Gralise, Neurontin What should I tell my care team before I take this medication? They need to know if you have any of these conditions: Alcohol or substance use disorder Kidney disease Lung or breathing disease Suicidal thoughts, plans, or attempt; a previous suicide attempt by you or a family member An unusual or allergic reaction to gabapentin, other medications, foods, dyes, or preservatives Pregnant or trying to get pregnant Breast-feeding How should I use this medication? Take this medication by mouth with a glass of water.  Follow the directions on the prescription label. You can take it with or without food. If it upsets your stomach, take it with food. Take your medication at regular intervals. Do not take it more often than directed. Do not stop taking except on your care team's advice. If you are directed to break the 600 or 800 mg tablets in half as part of your dose, the extra half tablet should be used for the next dose. If you have not used the extra half tablet  within 28 days, it should be thrown away. A special MedGuide will be given to you by the pharmacist with each prescription and refill. Be sure to read this information carefully each time. Talk to your care team about the use of this medication in children. While this medication may be prescribed for children as young as 3 years for selected conditions, precautions do apply. Overdosage: If you think you have taken too much of this medicine contact a poison control center or emergency room at once. NOTE: This medicine is only for you. Do not share this medicine with others. What if I miss a dose? If you miss a dose, take it as soon as you can. If it is almost time for your next dose, take only that dose. Do not take double or extra doses. What may interact with this medication? Alcohol Antihistamines for allergy, cough, and cold Certain medications for anxiety or sleep Certain medications for depression like amitriptyline, fluoxetine, sertraline Certain medications for seizures like phenobarbital, primidone Certain medications for stomach problems General anesthetics like halothane, isoflurane, methoxyflurane, propofol Local anesthetics like lidocaine, pramoxine, tetracaine Medications that relax muscles for surgery Opioid medications for pain Phenothiazines like chlorpromazine, mesoridazine, prochlorperazine, thioridazine This list may not describe all possible interactions. Give your health care provider a list of all the medicines, herbs, non-prescription drugs, or dietary supplements you use. Also tell them if you smoke, drink alcohol, or use illegal drugs. Some items may interact with your medicine. What should I watch for while using this medication? Visit your care team for regular checks on your progress. You may want to keep a record at home of how you feel your condition is responding to treatment. You may want to share this information with your care team at each visit. You should  contact your care team if your seizures get worse or if you have any new types of seizures. Do not stop taking this medication or any of your seizure medications unless instructed by your care team. Stopping your medication suddenly can increase your seizures or their severity. This medication may cause serious skin reactions. They can happen weeks to months after starting the medication. Contact your care team right away if you notice fevers or flu-like symptoms with a rash. The rash may be red or purple and then turn into blisters or peeling of the skin. Or, you might notice a red rash with swelling of the face, lips or lymph nodes in your neck or under your arms. Wear a medical identification bracelet or chain if you are taking this medication for seizures. Carry a card that lists all your medications. This medication may affect your coordination, reaction time, or judgment. Do not drive or operate machinery until you know how this medication affects you. Sit up or stand slowly to reduce the risk of dizzy or fainting spells. Drinking alcohol with this medication can increase the risk of these side effects. Your mouth may get dry. Chewing sugarless gum or sucking  hard candy, and drinking plenty of water may help. Watch for new or worsening thoughts of suicide or depression. This includes sudden changes in mood, behaviors, or thoughts. These changes can happen at any time but are more common in the beginning of treatment or after a change in dose. Call your care team right away if you experience these thoughts or worsening depression. If you become pregnant while using this medication, you may enroll in the [place] Antiepileptic Drug Pregnancy Registry by calling [phone]. This registry collects information about the safety of antiepileptic medication use during pregnancy. What side effects may I notice from receiving this medication? Side effects that you should report to your care team as  soon as possible: Allergic reactions or angioedema--skin rash, itching, hives, swelling of the face, eyes, lips, tongue, arms, or legs, trouble swallowing or breathing Rash, fever, and swollen lymph nodes Thoughts of suicide or self harm, worsening mood, feelings of depression Trouble breathing Unusual changes in mood or behavior in children after use such as difficulty concentrating, hostility, or restlessness Side effects that usually do not require medical attention (report to your care team if they continue or are bothersome): Dizziness Drowsiness Nausea Swelling of ankles, feet, or hands Vomiting This list may not describe all possible side effects. Call your doctor for medical advice about side effects. You may report side effects to FDA at 1-800-FDA-1088. Where should I keep my medication? Keep out of reach of children and pets. Store at room temperature between 15 and 30 degrees C (59 and 86 degrees F). Get rid of any unused medication after the expiration date. This medication may cause accidental overdose and death if taken by other adults, children, or pets. To get rid of medications that are no longer needed or have expired: Take the medication to a medication take-back program. Check with your pharmacy or law enforcement to find a location. If you cannot return the medication, check the label or package insert to see if the medication should be thrown out in the garbage or flushed down the toilet. If you are not sure, ask your care team. If it is safe to put it in the trash, empty the medication out of the container. Mix the medication with cat litter, dirt, coffee grounds, or other unwanted substance. Seal the mixture in a bag or container. Put it in the trash. NOTE: This sheet is a summary. It may not cover all possible information. If you have questions about this medicine, talk to your doctor, pharmacist, or health care provider.  2023 Elsevier/Gold Standard (2020-09-04  00:00:00) Methylprednisolone Tablets What is this medication? METHYLPREDNISOLONE (meth ill pred NISS oh lone) treats many conditions such as asthma, allergic reactions, arthritis, inflammatory bowel diseases, adrenal, and blood or bone marrow disorders. It works by decreasing inflammation, slowing down an overactive immune system, or replacing cortisol normally made in the body. Cortisol is a hormone that plays an important role in how the body responds to stress, illness, and injury. It belongs to a group of medications called steroids. This medicine may be used for other purposes; ask your health care provider or pharmacist if you have questions. COMMON BRAND NAME(S): Medrol, Medrol Dosepak What should I tell my care team before I take this medication? They need to know if you have any of these conditions: Cushing's syndrome Eye disease, vision problems Diabetes Glaucoma Heart disease High blood pressure Infection especially a viral infection, such as chickenpox, cold sores, or herpes Liver disease Mental health conditions Myasthenia  gravis Osteoporosis Recent or upcoming vaccine Seizures Stomach or intestine problems Thyroid disease An unusual or allergic reaction to lactose, methylprednisolone, other medications, foods, dyes, or preservatives Pregnant or trying to get pregnant Breastfeeding How should I use this medication? Take this medication by mouth with a glass of water. Follow the directions on the prescription label. Take this medication with food. If you are taking this medication once a day, take it in the morning. Do not take it more often than directed. Do not suddenly stop taking your medication because you may develop a severe reaction. Your care team will tell you how much medication to take. If your care team wants you to stop the medication, the dose may be slowly lowered over time to avoid any side effects. Talk to your care team about the use of this medication in  children. Special care may be needed. Overdosage: If you think you have taken too much of this medicine contact a poison control center or emergency room at once. NOTE: This medicine is only for you. Do not share this medicine with others. What if I miss a dose? If you miss a dose, take it as soon as you can. If it is almost time for your next dose, talk to your care team. You may need to miss a dose or take an extra dose. Do not take double or extra doses without advice. What may interact with this medication? Do not take this medication with any of the following: Alefacept Echinacea Live virus vaccines Metyrapone Mifepristone This medication may also interact with the following: Amphotericin B Aspirin and aspirin-like medications Certain antibiotics, such as erythromycin, clarithromycin, troleandomycin Certain medications for diabetes Certain medications for fungal infections, such as ketoconazole Certain medications for seizures, such as carbamazepine, phenobarbital, phenytoin Certain medications that treat or prevent blood clots, such as warfarin Cholestyramine Cyclosporine Digoxin Diuretics Estrogen or progestin hormones Isoniazid NSAIDs, medications for pain and inflammation, such as ibuprofen or naproxen Other medications for myasthenia gravis Rifampin Vaccines This list may not describe all possible interactions. Give your health care provider a list of all the medicines, herbs, non-prescription drugs, or dietary supplements you use. Also tell them if you smoke, drink alcohol, or use illegal drugs. Some items may interact with your medicine. What should I watch for while using this medication? Tell your care team if your symptoms do not start to get better or if they get worse. Do not stop taking except on your care team's advice. You may develop a severe reaction. Your care team will tell you how much medication to take. This medication may increase your risk of getting an  infection. Tell your care team if you are around anyone with measles or chickenpox, or if you develop sores or blisters that do not heal properly. This medication may increase blood sugar levels. Ask your care team if changes in diet or medications are needed if you have diabetes. Tell your care team right away if you have any change in your eyesight. Using this medication for a long time may increase your risk of low bone mass. Talk to your care team about bone health. What side effects may I notice from receiving this medication? Side effects that you should report to your care team as soon as possible: Allergic reactions--skin rash, itching, hives, swelling of the face, lips, tongue, or throat Cushing syndrome--increased fat around the midsection, upper back, neck, or face, pink or purple stretch marks on the skin, thinning, fragile skin that easily  bruises, unexpected hair growth High blood sugar (hyperglycemia)--increased thirst or amount of urine, unusual weakness or fatigue, blurry vision Increase in blood pressure Infection--fever, chills, cough, sore throat, wounds that don't heal, pain or trouble when passing urine, general feeling of discomfort or being unwell Low adrenal gland function--nausea, vomiting, loss of appetite, unusual weakness or fatigue, dizziness Mood and behavior changes--anxiety, nervousness, confusion, hallucinations, irritability, hostility, thoughts of suicide or self-harm, worsening mood, feelings of depression Stomach bleeding--bloody or black, tar-like stools, vomiting blood or brown material that looks like coffee grounds Swelling of the ankles, hands, or feet Side effects that usually do not require medical attention (report to your care team if they continue or are bothersome): Acne General discomfort and fatigue Headache Increase in appetite Nausea Trouble sleeping Weight gain This list may not describe all possible side effects. Call your doctor for  medical advice about side effects. You may report side effects to FDA at 1-800-FDA-1088. Where should I keep my medication? Keep out of the reach of children and pets. Store at room temperature between 20 and 25 degrees C (68 and 77 degrees F). Throw away any unused medication after the expiration date. NOTE: This sheet is a summary. It may not cover all possible information. If you have questions about this medicine, talk to your doctor, pharmacist, or health care provider.  2023 Elsevier/Gold Standard (2020-05-30 00:00:00) Lidocaine Patches What is this medication? LIDOCAINE (LYE doe kane) treats pain. It works by numbing a specific area of the body, which blocks pain signals going to the brain. It belongs to a group of medications called local anesthetics. This medicine may be used for other purposes; ask your health care provider or pharmacist if you have questions. COMMON BRAND NAME(S): Aspercreme with Lidocaine, Blue-Emu, DERMALID, GEN7T, Lidocan, Lidocare, Lidoderm, Lidofore, LidoReal-30, Lidozo, Salonpas Lidocaine, Xyliderm, ZTlido What should I tell my care team before I take this medication? They need to know if you have any of these conditions: G6PD deficiency Heart disease Kidney disease Liver disease Skin conditions or sensitivity An unusual or allergic reaction to lidocaine, parabens, other medications, foods, dyes, or preservatives Pregnant or trying to get pregnant Breast-feeding How should I use this medication? This medication is for external use only. Use it as directed on the label. Do not apply to burned or damaged skin. Do not use it more often than directed. Keep using it unless your care team tells you to stop. Talk to your care team about the use of this medication in children. Special care may be needed. Overdosage: If you think you have taken too much of this medicine contact a poison control center or emergency room at once. NOTE: This medicine is only for you.  Do not share this medicine with others. What if I miss a dose? If you miss a dose, use it as soon as you can. If it is almost time for your next dose, use only that dose. Do not use double or extra doses. What may interact with this medication? This medication may interact with the following: Acetaminophen Certain antibiotics, such as dapsone, nitrofurantoin, aminosalicylic acid, sulfasalazine Certain medications for seizures, such as phenobarbital, phenytoin, valproic acid Chloroquine Cyclophosphamide Dofetilide Flutamide Hydroxyurea Ifosfamide Local anesthetics, such as lidocaine, pramoxine, tetracaine MAOIs, such as Carbex, Eldepryl, Marplan, Nardil, and Parnate Metoclopramide Moricizine Nitroglycerin Primaquine Saquinavir Quinine This list may not describe all possible interactions. Give your health care provider a list of all the medicines, herbs, non-prescription drugs, or dietary supplements you use. Also tell  them if you smoke, drink alcohol, or use illegal drugs. Some items may interact with your medicine. What should I watch for while using this medication? Visit your care team for regular checks on your progress. Tell your care team if your symptoms do not start to get better or if they get worse. Be careful to avoid injury while the area is numb, and you are not aware of pain. If you are going to need surgery, an MRI, CT scan, or other procedure, tell your care team that you are using this medication. You may need to remove the patch before the procedure. What side effects may I notice from receiving this medication? Side effects that you should report to your care team as soon as possible: Allergic reactions--skin rash, itching, hives, swelling of the face, lips, tongue, or throat Headache, unusual weakness or fatigue, shortness of breath, nausea, vomiting, rapid heartbeat, blue skin or lips, which may be signs of methemoglobinemia Heart rhythm changes--fast or irregular  heartbeat, dizziness, feeling faint or lightheaded, chest pain, trouble breathing Side effects that usually do not require medical attention (report to your care team if they continue or are bothersome): Change in skin color Irritation at application site This list may not describe all possible side effects. Call your doctor for medical advice about side effects. You may report side effects to FDA at 1-800-FDA-1088. Where should I keep my medication? Keep out of the reach of children and pets. See product for storage information. Each product may have different instructions. Get rid of any unused medication after the expiration date. To get rid of medications that are no longer needed or have expired: Take the medication to a medication take-back program. Check with your pharmacy or law enforcement to find a location. If you cannot return the medication, ask your pharmacist or care team how to get rid of this medication safely. NOTE: This sheet is a summary. It may not cover all possible information. If you have questions about this medicine, talk to your doctor, pharmacist, or health care provider.  2023 Elsevier/Gold Standard (2021-03-23 00:00:00)

## 2023-06-02 ENCOUNTER — Ambulatory Visit (INDEPENDENT_AMBULATORY_CARE_PROVIDER_SITE_OTHER): Payer: BLUE CROSS/BLUE SHIELD | Admitting: Podiatry

## 2023-06-02 ENCOUNTER — Encounter: Payer: Self-pay | Admitting: Podiatry

## 2023-06-02 DIAGNOSIS — M7751 Other enthesopathy of right foot: Secondary | ICD-10-CM

## 2023-06-02 DIAGNOSIS — M722 Plantar fascial fibromatosis: Secondary | ICD-10-CM

## 2023-06-02 MED ORDER — NURTEC 75 MG PO TBDP: 75.0000 mg | ORAL_TABLET | Freq: Every day | ORAL | 11 refills | Status: AC | PRN

## 2023-06-04 NOTE — Progress Notes (Signed)
 Subjective:   Patient ID: Brenda King, female   DOB: 63 y.o.   MRN: [id number]   HPI Patient states she is still getting discomfort in both of her feet but she is trying to work her way through it.  States that the bottom of the foot is bothersome and the ankle can be bothersome and she does have to wear steel toes but is out right now with her back   ROS      Objective:  Physical Exam  Neurovascular status intact with the patient found to have fibro formation of the mid band of the fascia bilateral and discomfort into the sinus tarsi right and left     Assessment:  Probability for plantar fibroma formation along with inflammatory fasciitis sinus tarsitis     Plan:  H&P reviewed all conditions and it is possible if she ends up having to have neck or back surgery that we may be able to do resection of the plantar fibromas within a period of time.  She is due for MRI and once results are there we can make a decision on what might be appropriate to help her with this particular condition.  All questions answered today concerning possibility for surgery in the future

## 2023-06-16 ENCOUNTER — Encounter: Payer: Self-pay | Admitting: Neurology

## 2023-06-18 ENCOUNTER — Encounter: Payer: Self-pay | Admitting: Gastroenterology

## 2023-09-04 ENCOUNTER — Other Ambulatory Visit (HOSPITAL_COMMUNITY): Payer: Self-pay

## 2023-10-06 ENCOUNTER — Other Ambulatory Visit (HOSPITAL_COMMUNITY): Payer: Self-pay

## 2023-10-30 ENCOUNTER — Telehealth: Payer: Self-pay | Admitting: Pharmacist

## 2023-10-30 NOTE — Telephone Encounter (Signed)
 Pharmacy Patient Advocate Encounter  Received notification from [facility] that Prior Authorization for Nurtec 75MG  dispersible tablets has been APPROVED from 10/30/2023 to 10/29/2024   PA #/Case ID/Reference #: [id number]

## 2023-10-30 NOTE — Telephone Encounter (Signed)
 Pharmacy Patient Advocate Encounter   Received notification from Patient Pharmacy that prior authorization for Nurtec 75MG  dispersible tablets is required/requested.   Insurance verification completed.   The patient is insured through [facility] .   Per test claim: PA required; PA submitted to above mentioned insurance via Latent Key/confirmation #/EOC [id number] Status is pending

## 2023-11-03 ENCOUNTER — Other Ambulatory Visit (HOSPITAL_COMMUNITY): Payer: Self-pay

## 2023-11-11 ENCOUNTER — Encounter: Payer: Self-pay | Admitting: Neurology

## 2023-11-11 ENCOUNTER — Encounter: Payer: Self-pay | Admitting: Podiatry

## 2023-11-17 ENCOUNTER — Ambulatory Visit: Admission: EM | Admit: 2023-11-17 | Discharge: 2023-11-17 | Disposition: A | Discharge status: Home / Self Care

## 2023-11-17 DIAGNOSIS — R5383 Other fatigue: Secondary | ICD-10-CM

## 2023-11-17 DIAGNOSIS — R0602 Shortness of breath: Secondary | ICD-10-CM | POA: Diagnosis not present

## 2023-11-17 DIAGNOSIS — J22 Unspecified acute lower respiratory infection: Secondary | ICD-10-CM

## 2023-11-17 LAB — POCT RAPID STREP A (OFFICE): Rapid Strep A Screen: NEGATIVE

## 2023-11-17 LAB — POC SOFIA SARS ANTIGEN FIA: SARS Coronavirus 2 Ag: NEGATIVE

## 2023-11-17 MED ORDER — PREDNISONE 20 MG PO TABS: 40.0000 mg | ORAL_TABLET | Freq: Every day | ORAL | 0 refills | Status: DC

## 2023-11-17 MED ORDER — PROMETHAZINE-DM 6.25-15 MG/5ML PO SYRP: 5.0000 mL | ORAL_SOLUTION | Freq: Four times a day (QID) | ORAL | 0 refills | Status: DC | PRN

## 2023-11-17 MED ORDER — AMOXICILLIN-POT CLAVULANATE 875-125 MG PO TABS: 1.0000 | ORAL_TABLET | Freq: Two times a day (BID) | ORAL | 0 refills | Status: DC

## 2023-11-17 NOTE — ED Triage Notes (Signed)
 Pt being seen in UC for sore throat, productive cough, fatigue, and intermittent SOB with movement and coughing. Pt reports s/s started 8 days ago. Pt reports grandchild was recently sick. Pt reports taking multiple different otc medications with no relief.

## 2023-11-17 NOTE — ED Provider Notes (Signed)
 [facility]    CSN: [id number] Arrival date & time: 11/17/23  1546      History   Chief Complaint Chief Complaint  Patient presents with   Sore Throat   Cough    HPI Brenda King is a 63 y.o. female.   Patient presenting today with almost a week and a half of progressively worsening sore throat, productive cough, fatigue, shortness of breath, fatigue, generalized weakness.  Denies chest pain, abdominal pain, vomiting, diarrhea.  So far trying numerous over-the-counter cold and congestion medications with no relief.  No known history of chronic pulmonary disease.  Multiple sick contacts recently.    Past Medical History:  Diagnosis Date   Anxiety    Atrial fibrillation [facility])    Status post ablation 2004 and 2005 - Dr. Epifanio [facility])   Constipation    Degenerative disc disease, cervical    Menopause    Nodular infiltrative basal cell carcinoma (BCC) 07/04/2021   Left Shoulder    Patient Active Problem List   Diagnosis Date Noted   Bowel habit changes 03/25/2023   Respiratory illness 02/23/2023   Worsening headaches 01/04/2023   Midline low back pain without sciatica 10/02/2022   Migraine without aura and without status migrainosus, not intractable 06/03/2017   Chronic idiopathic constipation 02/04/2013   Allergic rhinitis 07/09/2012    Past Surgical History:  Procedure Laterality Date   ABDOMINAL HYSTERECTOMY     BREAST ENHANCEMENT SURGERY  2001   BUNIONECTOMY Right    CARDIAC ELECTROPHYSIOLOGY MAPPING AND ABLATION     COLONOSCOPY WITH PROPOFOL  N/A 04/17/2023   Procedure: COLONOSCOPY WITH PROPOFOL ;  Surgeon: Shaaron Lamar HERO, MD;  Location: [facility];  Service: Endoscopy;  Laterality: N/A;  12:30pm, asa 2   CYSTOSCOPY W/ RETROGRADES Bilateral 04/17/2016   Procedure: CYSTOSCOPY WITH BILATERAL RETROGRADE PYELOGRAMS;  Surgeon: Belvie LITTIE Clara, MD;  Location: [facility];  Service: Urology;  Laterality: Bilateral;   PARTIAL HYSTERECTOMY  2000    PLANTAR FASCIECTOMY Right    SHOULDER ARTHROSCOPY     frozen shoulder   SKIN CANCER EXCISION Left    left shoulder    OB History   No obstetric history on file.      Home Medications    Prior to Admission medications   Medication Sig Start Date End Date Taking? Authorizing Provider  amoxicillin -clavulanate (AUGMENTIN ) 875-125 MG tablet Take 1 tablet by mouth every 12 (twelve) hours. 11/17/23  Yes Stuart Vernell Norris, PA-C  predniSONE  (DELTASONE ) 20 MG tablet Take 2 tablets (40 mg total) by mouth daily with breakfast. 11/17/23  Yes Stuart Vernell Norris, PA-C  promethazine -dextromethorphan (PROMETHAZINE -DM) 6.25-15 MG/5ML syrup Take 5 mLs by mouth 4 (four) times daily as needed. 11/17/23  Yes Stuart Vernell Norris, PA-C  Rimegepant Sulfate (NURTEC PO) Take 75 mg by mouth as needed.   Yes [provider]  ALPRAZolam  (XANAX ) 0.5 MG tablet Take 0.5 mg by mouth as needed. Taking as needed related to menopause symptoms 08/17/20   [provider]  aspirin EC 81 MG tablet Take 81 mg by mouth daily. Swallow whole.    [provider]  Aspirin-Acetaminophen -Caffeine (EXCEDRIN MIGRAINE PO) Take by mouth as needed. Patient not taking: Reported on 11/17/2023    [provider]  EQL EVENING PRIMROSE OIL PO Take by mouth.    [provider]  escitalopram (LEXAPRO) 10 MG tablet Take 10 mg by mouth daily. 02/25/19   [provider]  estradiol (ESTRACE) 2 MG tablet Take 2 mg  by mouth daily.    [provider]  gabapentin  (NEURONTIN ) 400 MG capsule Take 1 capsule (400 mg total) by mouth 3 (three) times daily. Patient not taking: Reported on 11/17/2023 05/12/23   Magdalen Pasco RAMAN, DPM  lidocaine  (LIDODERM ) 5 % Place 3 patches onto the skin daily. Remove & Discard patch within 12 hours or as directed by MD 05/28/23   Ines Onetha NOVAK, MD  progesterone (PROMETRIUM) 100 MG capsule Take 100 mg by mouth at bedtime. 06/30/20   [provider]   Rimegepant Sulfate (NURTEC) 75 MG TBDP Take 1 tablet (75 mg total) by mouth daily as needed. For migraines. Take as close to onset of migraine as possible. One daily maximum. Please use copay card: BIN [id number] PCN CN GRP [id number] ID [id number] EXP 03/17/2024 06/02/23   Ines Onetha NOVAK, MD  tretinoin (RETIN-A) 0.05 % cream  02/19/23   [provider]    Family History Family History  Problem Relation Age of Onset   CAD Father    Heart disease Father    CAD Brother    Throat cancer Maternal Grandfather    Cancer Other        breast   Cancer - Colon Maternal Uncle    Colon cancer Neg Hx     Social History Social History   Tobacco Use   Smoking status: Former    Current packs/day: 0.25    Average packs/day: 0.3 packs/day for 30.0 years (7.5 ttl pk-yrs)    Types: Cigarettes   Smokeless tobacco: Former    Quit date: 11/04/2012  Vaping Use   Vaping status: Every Day  Substance Use Topics   Alcohol use: No    Alcohol/week: 0.0 standard drinks of alcohol   Drug use: No     Allergies   Docusate sodium, Miralax [polyethylene glycol], Other, Bisacodyl, Chlorzoxazone , Golytely [peg 3350-kcl-nabcb-nacl-nasulf], Latex, Qulipta  [atogepant ], and Topamax  Ariana.Bathe ]   Review of Systems Review of Systems Per HPI  Physical Exam Triage Vital Signs ED Triage Vitals  Encounter Vitals Group     BP 11/17/23 1652 120/84     Girls Systolic BP Percentile --      Girls Diastolic BP Percentile --      Boys Systolic BP Percentile --      Boys Diastolic BP Percentile --      Pulse Rate 11/17/23 1652 69     Resp 11/17/23 1652 19     Temp 11/17/23 1652 98.3 F (36.8 C)     Temp Source 11/17/23 1652 Oral     SpO2 11/17/23 1652 97 %     Weight --      Height --      Head Circumference --      Peak Flow --      Pain Score 11/17/23 1653 2     Pain Loc --      Pain Education --      Exclude from Growth Chart --    No data found.  Updated Vital Signs BP 120/84 (BP Location:  Right Arm)   Pulse 69   Temp 98.3 F (36.8 C) (Oral)   Resp 19   SpO2 97%   Visual Acuity Right Eye Distance:   Left Eye Distance:   Bilateral Distance:    Right Eye Near:   Left Eye Near:    Bilateral Near:     Physical Exam Vitals and nursing note reviewed.  Constitutional:      Appearance: Normal appearance.  HENT:  Head: Atraumatic.     Right Ear: Tympanic membrane and external ear normal.     Left Ear: Tympanic membrane and external ear normal.     Nose: Congestion present.     Mouth/Throat:     Mouth: Mucous membranes are moist.     Pharynx: Posterior oropharyngeal erythema present.  Eyes:     Extraocular Movements: Extraocular movements intact.     Conjunctiva/sclera: Conjunctivae normal.  Cardiovascular:     Rate and Rhythm: Normal rate and regular rhythm.     Heart sounds: Normal heart sounds.  Pulmonary:     Effort: Pulmonary effort is normal.     Breath sounds: Normal breath sounds. No wheezing or rales.  Musculoskeletal:        General: Normal range of motion.     Cervical back: Normal range of motion and neck supple.  Skin:    General: Skin is warm and dry.  Neurological:     Mental Status: She is alert and oriented to person, place, and time.  Psychiatric:        Mood and Affect: Mood normal.        Thought Content: Thought content normal.      UC Treatments / Results  Labs (all labs ordered are listed, but only abnormal results are displayed) Labs Reviewed  POCT RAPID STREP A (OFFICE)  POC SOFIA SARS ANTIGEN FIA    EKG   Radiology No results found.  Procedures Procedures (including critical care time)  Medications Ordered in UC Medications - No data to display  Initial Impression / Assessment and Plan / UC Course  I have reviewed the triage vital signs and the nursing notes.  Pertinent labs & imaging results that were available during my care of the patient were reviewed by me and considered in my medical decision making (see  chart for details).     Rapid strep and COVID-negative, vital signs within normal limits today, given duration and worsening course of symptoms will treat with Augmentin , prednisone , Phenergan  DM for suspected secondary bacterial lower respiratory infection secondary to a viral respiratory infection.  Return for worsening symptoms.  Final Clinical Impressions(s) / UC Diagnoses   Final diagnoses:  Lower respiratory infection  Other fatigue  SOB (shortness of breath)   Discharge Instructions   None    ED Prescriptions     Medication Sig Dispense Auth. Provider   amoxicillin -clavulanate (AUGMENTIN ) 875-125 MG tablet Take 1 tablet by mouth every 12 (twelve) hours. 14 tablet Stuart Vernell Norris, PA-C   predniSONE  (DELTASONE ) 20 MG tablet Take 2 tablets (40 mg total) by mouth daily with breakfast. 10 tablet Stuart Vernell Norris, PA-C   promethazine -dextromethorphan (PROMETHAZINE -DM) 6.25-15 MG/5ML syrup Take 5 mLs by mouth 4 (four) times daily as needed. 100 mL Stuart Vernell Norris, [place]      PDMP not reviewed this encounter.   Stuart Vernell Norris, [place] 11/17/23 [phone]

## 2023-11-24 DIAGNOSIS — Z0271 Encounter for disability determination: Secondary | ICD-10-CM

## 2023-11-24 NOTE — Telephone Encounter (Signed)
 Recd forms from [employer] from pt. Faxed [place] [employer] [phone] form/notes

## 2023-11-26 ENCOUNTER — Other Ambulatory Visit: Payer: Self-pay | Admitting: Podiatry

## 2023-12-01 ENCOUNTER — Ambulatory Visit: Admitting: Neurology

## 2023-12-01 ENCOUNTER — Telehealth: Payer: Self-pay | Admitting: Neurology

## 2023-12-01 NOTE — Telephone Encounter (Signed)
 Mychart provider out r/s appt

## 2023-12-18 ENCOUNTER — Ambulatory Visit: Admitting: Neurology

## 2023-12-24 ENCOUNTER — Ambulatory Visit (HOSPITAL_COMMUNITY)
Admission: RE | Admit: 2023-12-24 | Discharge: 2023-12-24 | Disposition: A | Discharge status: Home / Self Care | Source: Ambulatory Visit | Attending: Family Medicine | Admitting: Family Medicine

## 2023-12-24 ENCOUNTER — Ambulatory Visit: Payer: Self-pay | Admitting: Family Medicine

## 2023-12-24 ENCOUNTER — Encounter: Payer: Self-pay | Admitting: Family Medicine

## 2023-12-24 ENCOUNTER — Ambulatory Visit (INDEPENDENT_AMBULATORY_CARE_PROVIDER_SITE_OTHER): Payer: Self-pay | Admitting: Family Medicine

## 2023-12-24 VITALS — BP 109/70 | HR 78 | Ht 69.0 in | Wt 170.0 lb

## 2023-12-24 DIAGNOSIS — J988 Other specified respiratory disorders: Secondary | ICD-10-CM | POA: Insufficient documentation

## 2023-12-24 MED ORDER — HYDROCODONE BIT-HOMATROP MBR 5-1.5 MG/5ML PO SOLN: 5.0000 mL | Freq: Three times a day (TID) | ORAL | 0 refills | Status: DC | PRN

## 2023-12-24 MED ORDER — SACCHAROMYCES BOULARDII 250 MG PO CAPS
250.0000 mg | ORAL_CAPSULE | Freq: Two times a day (BID) | ORAL | 1 refills | Status: DC
Start: 2023-12-24 — End: 2024-01-13

## 2023-12-24 NOTE — Assessment & Plan Note (Signed)
 Likely viral. Chest xray clear. Hycodan for cough. Florastor for GI issues (after recent antibiotics).

## 2023-12-24 NOTE — Progress Notes (Signed)
 Subjective:  Patient ID: Brenda King, female    DOB: 29-Dec-1960  Age: 63 y.o. MRN: [id number]  CC:   Chief Complaint  Patient presents with   Cough    Coughing for over a week worse at night    GI Problem    GI upset , foulness odor for the last week     HPI:  63 year old female presets for evaluation of the above.  Treated earlier in September for a respiratory infection. Symptoms resolved. Over the past week, she has gotten sick again.  Reports severe cough which is worse at night. No fever. Has used Dayquil, Delsym, and Mucinex without relief. Reports associated hoarseness. She is also experiencing GI upset.  Patient Active Problem List   Diagnosis Date Noted   Respiratory infection 12/24/2023   Worsening headaches 01/04/2023   Midline low back pain without sciatica 10/02/2022   Migraine without aura and without status migrainosus, not intractable 06/03/2017   Chronic idiopathic constipation 02/04/2013   Allergic rhinitis 07/09/2012    Social Hx   Social History   Socioeconomic History   Marital status: Married    Spouse name: Not on file   Number of children: Not on file   Years of education: Not on file   Highest education level: Not on file  Occupational History   Not on file  Tobacco Use   Smoking status: Former    Current packs/day: 0.25    Average packs/day: 0.3 packs/day for 30.0 years (7.5 ttl pk-yrs)    Types: Cigarettes   Smokeless tobacco: Former    Quit date: 11/04/2012  Vaping Use   Vaping status: Every Day  Substance and Sexual Activity   Alcohol use: No    Alcohol/week: 0.0 standard drinks of alcohol   Drug use: No   Sexual activity: Yes    Birth control/protection: Surgical  Other Topics Concern   Not on file  Social History Narrative   Not on file   Social Drivers of Health   Financial Resource Strain: Not on file  Food Insecurity: Not on file  Transportation Needs: Not on file  Physical Activity: Not on file  Stress: Not on  file (01/23/2023)  Social Connections: Not on file    Review of Systems Per HPI  Objective:  BP 109/70   Pulse 78   Ht 5' 9 (1.753 m)   Wt 170 lb (77.1 kg)   SpO2 98%   BMI 25.10 kg/m      12/24/2023    1:04 PM 11/17/2023    4:52 PM 05/28/2023    1:58 PM  BP/Weight  Systolic BP 109 120 109  Diastolic BP 70 84 63  Wt. (Lbs) 170  166.2  BMI 25.1 kg/m2  24.54 kg/m2    Physical Exam Vitals and nursing note reviewed.  Constitutional:      General: She is not in acute distress.    Appearance: Normal appearance.  HENT:     Head: Normocephalic and atraumatic.  Cardiovascular:     Rate and Rhythm: Normal rate and regular rhythm.  Pulmonary:     Effort: Pulmonary effort is normal.     Breath sounds: Normal breath sounds. No wheezing, rhonchi or rales.  Neurological:     Mental Status: She is alert.     Lab Results  Component Value Date   WBC 5.5 01/01/2023   HGB 13.7 01/01/2023   HCT 41.1 01/01/2023   PLT 331 01/01/2023   GLUCOSE 73 01/01/2023  CHOL 161 12/20/2016   TRIG 60 12/20/2016   HDL 61 12/20/2016   LDLCALC 88 12/20/2016   ALT 15 01/01/2023   AST 18 01/01/2023   NA 140 01/01/2023   K 4.4 01/01/2023   CL 103 01/01/2023   CREATININE 0.74 01/01/2023   BUN 8 01/01/2023   CO2 25 01/01/2023   TSH 2.340 01/01/2023     Assessment & Plan:  Respiratory infection Assessment & Plan: Likely viral. Chest xray clear. Hycodan for cough. Florastor for GI issues (after recent antibiotics).  Orders: -     DG Chest 2 View -     HYDROcodone  Bit-Homatrop MBr; Take 5 mLs by mouth every 8 (eight) hours as needed for cough.  Dispense: 120 mL; Refill: 0  Other orders -     Saccharomyces boulardii; Take 1 capsule (250 mg total) by mouth 2 (two) times daily.  Dispense: 60 capsule; Refill: 1    Follow-up:  Return if symptoms worsen or fail to improve.  Jacqulyn Ahle DO [facility]

## 2023-12-24 NOTE — Patient Instructions (Signed)
 Likely viral.  Chest xray today.  Medications as prescribed.  We will notify with results.

## 2024-01-05 ENCOUNTER — Ambulatory Visit: Payer: Self-pay

## 2024-01-05 NOTE — Telephone Encounter (Signed)
 FYI Only or Action Required?: FYI only for provider.  Patient was last seen in primary care on 12/24/2023 by Cook, Jayce G, DO.  Called Nurse Triage reporting Cough.  Symptoms began several weeks ago.  Symptoms are: gradually worsening.  Triage Disposition: See Physician Within 24 Hours  Patient/caregiver understands and will follow disposition?: Yes     Copied from CRM #[id number]. Topic: Clinical - Red Word Triage >> Jan 05, 2024 12:27 PM Brenda King wrote: Red Word that prompted transfer to Nurse Triage: Ribs are sore, chronic coughing feels like choking at time, green phlegm, breathing feels labored; Saturday could not hold head up not feeling well      Reason for Disposition  SEVERE coughing spells (e.g., whooping sound after coughing, vomiting after coughing)  Answer Assessment - Initial Assessment Questions 1. ONSET: When did the cough begin?      6 weeks ago  2. SEVERITY: How bad is the cough today?      Moderate to severe  3. SPUTUM: Describe the color of your sputum (e.g., none, dry cough; clear, white, yellow, green)     Green sputum  4. HEMOPTYSIS: Are you coughing up any blood? If so ask: How much? (e.g., flecks, streaks, tablespoons, etc.)     No 5. DIFFICULTY BREATHING: Are you having difficulty breathing? If Yes, ask: How bad is it? (e.g., mild, moderate, severe)      Mild  6. FEVER: Do you have a fever? If Yes, ask: What is your temperature, how was it measured, and when did it start?     No 7. CARDIAC HISTORY: Do you have any history of heart disease? (e.g., heart attack, congestive heart failure)      No 8. LUNG HISTORY: Do you have any history of lung disease?  (e.g., pulmonary embolus, asthma, emphysema)     No 9. PE RISK FACTORS: Do you have a history of blood clots? (or: recent major surgery, recent prolonged travel, bedridden)     No 10. OTHER SYMPTOMS: Do you have any other symptoms? (e.g., runny nose, wheezing, chest pain)        Pain to ribs from coughing  Protocols used: Cough - Chronic-A-AH

## 2024-01-06 ENCOUNTER — Encounter: Payer: Self-pay | Admitting: Family Medicine

## 2024-01-06 ENCOUNTER — Ambulatory Visit: Admitting: Family Medicine

## 2024-01-06 VITALS — BP 104/71 | HR 80 | Temp 98.1°F | Ht 69.0 in | Wt 169.0 lb

## 2024-01-06 DIAGNOSIS — J988 Other specified respiratory disorders: Secondary | ICD-10-CM

## 2024-01-06 MED ORDER — LEVOFLOXACIN 750 MG PO TABS: 750.0000 mg | ORAL_TABLET | Freq: Every day | ORAL | 0 refills | Status: DC

## 2024-01-06 MED ORDER — PREDNISONE 50 MG PO TABS: 50.0000 mg | ORAL_TABLET | Freq: Every day | ORAL | 0 refills | Status: AC

## 2024-01-06 MED ORDER — ALBUTEROL SULFATE HFA 108 (90 BASE) MCG/ACT IN AERS: 1.0000 | INHALATION_SPRAY | Freq: Four times a day (QID) | RESPIRATORY_TRACT | 0 refills | Status: DC | PRN

## 2024-01-06 NOTE — Patient Instructions (Signed)
 Medications as prescribed.  If you worsen or do not improve, please let me know.

## 2024-01-07 NOTE — Assessment & Plan Note (Signed)
 Symptoms persistent and worsening.  Given recent antibiotic use, proceeding with Levaquin.  Also placing on corticosteroids and as needed albuterol .

## 2024-01-07 NOTE — Progress Notes (Signed)
 Subjective:  Patient ID: Brenda King, female    DOB: Oct 01, 1960  Age: 63 y.o. MRN: [id number]  CC:   Chief Complaint  Patient presents with   poductive cough x 6 weeks    Has been noticing her O2 sat levels drop 91% - right sided rib pain worse- coughing is constant day and night    HPI:  63 year old female presents with persistent cough.  Patient has been sick for 6 weeks per her report.  Was recently seen on 10/8 for the same symptoms.  Chest x-ray at that time was negative.  She feels very poorly.  Severe cough which is productive.  Associated rib discomfort.  Reports significant fatigue and wheezing as well.  No relieving factors.  Patient Active Problem List   Diagnosis Date Noted   Respiratory infection 12/24/2023   Worsening headaches 01/04/2023   Midline low back pain without sciatica 10/02/2022   Migraine without aura and without status migrainosus, not intractable 06/03/2017   Chronic idiopathic constipation 02/04/2013   Allergic rhinitis 07/09/2012    Social Hx   Social History   Socioeconomic History   Marital status: Married    Spouse name: Not on file   Number of children: Not on file   Years of education: Not on file   Highest education level: Not on file  Occupational History   Not on file  Tobacco Use   Smoking status: Former    Current packs/day: 0.25    Average packs/day: 0.3 packs/day for 30.0 years (7.5 ttl pk-yrs)    Types: Cigarettes   Smokeless tobacco: Former    Quit date: 11/04/2012  Vaping Use   Vaping status: Every Day  Substance and Sexual Activity   Alcohol use: No    Alcohol/week: 0.0 standard drinks of alcohol   Drug use: No   Sexual activity: Yes    Birth control/protection: Surgical  Other Topics Concern   Not on file  Social History Narrative   Not on file   Social Drivers of Health   Financial Resource Strain: Low Risk  (01/05/2024)   Overall Financial Resource Strain (CARDIA)    Difficulty of Paying Living  Expenses: Not very hard  Food Insecurity: Patient Declined (01/05/2024)   Hunger Vital Sign    Worried About Running Out of Food in the Last Year: Patient declined    Ran Out of Food in the Last Year: Patient declined  Transportation Needs: Patient Declined (01/05/2024)   PRAPARE - Administrator, Civil Service (Medical): Patient declined    Lack of Transportation (Non-Medical): Patient declined  Physical Activity: Inactive (01/05/2024)   Exercise Vital Sign    Days of Exercise per Week: 0 days    Minutes of Exercise per Session: Not on file  Stress: Stress Concern Present (01/05/2024)   [employer] of Occupational Health - Occupational Stress Questionnaire    Feeling of Stress: Rather much  Social Connections: Unknown (01/05/2024)   Social Connection and Isolation Panel    Frequency of Communication with Friends and Family: Twice a week    Frequency of Social Gatherings with Friends and Family: Patient declined    Attends Religious Services: More than 4 times per year    Active Member of [employer] or Organizations: No    Attends Engineer, structural: Not on file    Marital Status: Married    Review of Systems Per HPI  Objective:  BP 104/71   Pulse 80   Temp 98.1 F (  36.7 C)   Ht 5' 9 (1.753 m)   Wt 169 lb (76.7 kg)   SpO2 95%   BMI 24.96 kg/m      01/06/2024    3:25 PM 12/24/2023    1:04 PM 11/17/2023    4:52 PM  BP/Weight  Systolic BP 104 109 120  Diastolic BP 71 70 84  Wt. (Lbs) 169 170   BMI 24.96 kg/m2 25.1 kg/m2     Physical Exam Constitutional:      General: She is not in acute distress.    Comments: Appears fatigued.  HENT:     Head: Normocephalic and atraumatic.  Eyes:     General:        Right eye: No discharge.        Left eye: No discharge.     Conjunctiva/sclera: Conjunctivae normal.  Cardiovascular:     Rate and Rhythm: Normal rate and regular rhythm.  Pulmonary:     Breath sounds: Normal breath sounds. No wheezing,  rhonchi or rales.  Neurological:     Mental Status: She is alert.     Lab Results  Component Value Date   WBC 5.5 01/01/2023   HGB 13.7 01/01/2023   HCT 41.1 01/01/2023   PLT 331 01/01/2023   GLUCOSE 73 01/01/2023   CHOL 161 12/20/2016   TRIG 60 12/20/2016   HDL 61 12/20/2016   LDLCALC 88 12/20/2016   ALT 15 01/01/2023   AST 18 01/01/2023   NA 140 01/01/2023   K 4.4 01/01/2023   CL 103 01/01/2023   CREATININE 0.74 01/01/2023   BUN 8 01/01/2023   CO2 25 01/01/2023   TSH 2.340 01/01/2023     Assessment & Plan:  Respiratory infection Assessment & Plan: Symptoms persistent and worsening.  Given recent antibiotic use, proceeding with Levaquin.  Also placing on corticosteroids and as needed albuterol .  Orders: -     levoFLOXacin; Take 1 tablet (750 mg total) by mouth daily.  Dispense: 7 tablet; Refill: 0 -     predniSONE ; Take 1 tablet (50 mg total) by mouth daily for 5 days.  Dispense: 5 tablet; Refill: 0 -     Albuterol  Sulfate HFA; Inhale 1-2 puffs into the lungs every 6 (six) hours as needed for wheezing or shortness of breath.  Dispense: 18 g; Refill: 0    Follow-up:  Return if symptoms worsen or fail to improve.  Jacqulyn Ahle DO [facility]

## 2024-01-13 ENCOUNTER — Ambulatory Visit: Admitting: Internal Medicine

## 2024-01-13 ENCOUNTER — Encounter: Payer: Self-pay | Admitting: Internal Medicine

## 2024-01-13 ENCOUNTER — Telehealth: Payer: Self-pay

## 2024-01-13 ENCOUNTER — Ambulatory Visit

## 2024-01-13 VITALS — BP 129/81 | HR 74 | Temp 97.6°F | Ht 69.0 in | Wt 171.0 lb

## 2024-01-13 DIAGNOSIS — R0609 Other forms of dyspnea: Secondary | ICD-10-CM | POA: Diagnosis not present

## 2024-01-13 DIAGNOSIS — R058 Other specified cough: Secondary | ICD-10-CM | POA: Diagnosis not present

## 2024-01-13 LAB — CBC WITH DIFFERENTIAL/PLATELET
Basophils Absolute: 0.1 K/uL (ref 0.0–0.1)
Basophils Relative: 1 % (ref 0.0–3.0)
Eosinophils Absolute: 0.3 K/uL (ref 0.0–0.7)
Eosinophils Relative: 2.9 % (ref 0.0–5.0)
HCT: 44.2 % (ref 36.0–46.0)
Hemoglobin: 14.6 g/dL (ref 12.0–15.0)
Lymphocytes Relative: 40 % (ref 12.0–46.0)
Lymphs Abs: 3.7 K/uL (ref 0.7–4.0)
MCHC: 33.1 g/dL (ref 30.0–36.0)
MCV: 96 fl (ref 78.0–100.0)
Monocytes Absolute: 0.6 K/uL (ref 0.1–1.0)
Monocytes Relative: 6.1 % (ref 3.0–12.0)
Neutro Abs: 4.6 K/uL (ref 1.4–7.7)
Neutrophils Relative %: 50 % (ref 43.0–77.0)
Platelets: 454 K/uL — ABNORMAL HIGH (ref 150.0–400.0)
RBC: 4.61 Mil/uL (ref 3.87–5.11)
RDW: 13.6 % (ref 11.5–15.5)
WBC: 9.2 K/uL (ref 4.0–10.5)

## 2024-01-13 LAB — BRAIN NATRIURETIC PEPTIDE: Pro B Natriuretic peptide (BNP): 48 pg/mL (ref 0.0–100.0)

## 2024-01-13 LAB — SEDIMENTATION RATE: Sed Rate: 5 mm/h (ref 0–30)

## 2024-01-13 LAB — BASIC METABOLIC PANEL WITH GFR
BUN: 11 mg/dL (ref 6–23)
CO2: 30 meq/L (ref 19–32)
Calcium: 10 mg/dL (ref 8.4–10.5)
Chloride: 101 meq/L (ref 96–112)
Creatinine, Ser: 0.8 mg/dL (ref 0.40–1.20)
GFR: 79.61 mL/min (ref >60.00)
Glucose, Bld: 80 mg/dL (ref 70–99)
Potassium: 4.8 meq/L (ref 3.5–5.1)
Sodium: 140 meq/L (ref 135–145)

## 2024-01-13 LAB — TSH: TSH: 2.28 u[IU]/mL (ref 0.35–5.50)

## 2024-01-13 MED ORDER — MEPERIDINE HCL 50 MG PO TABS: 50.0000 mg | ORAL_TABLET | ORAL | 0 refills | Status: DC | PRN

## 2024-01-13 MED ORDER — PANTOPRAZOLE SODIUM 40 MG PO TBEC: 40.0000 mg | DELAYED_RELEASE_TABLET | Freq: Every day | ORAL | 2 refills | Status: DC

## 2024-01-13 MED ORDER — FAMOTIDINE 20 MG PO TABS
ORAL_TABLET | ORAL | 11 refills | Status: AC
Start: 2024-01-13 — End: ?

## 2024-01-13 MED ORDER — METHYLPREDNISOLONE ACETATE 80 MG/ML IJ SUSP
120.0000 mg | Freq: Once | INTRAMUSCULAR | Status: AC
  Administered 2024-01-13: 120 mg via INTRAMUSCULAR

## 2024-01-13 NOTE — Assessment & Plan Note (Addendum)
 Onset 1st week in Sept 2025  -  Allergy screen 01/13/2024 >  Eos 0. /  IgE  0.3 several days off prednisone   -  cyclical cough rx 01/13/2024 >>>  Of the three most common causes of  Sub-acute / recurrent or chronic cough, only one (GERD)  can actually contribute to/ trigger  the other two (asthma and post nasal drip syndrome)  and perpetuate the cylce of cough.  While not intuitively obvious, many patients with chronic low grade reflux do not cough until there is a primary insult that disturbs the protective epithelial barrier and exposes sensitive nerve endings.   This is typically viral but can due to PNDS and  either may apply here.    >>>  The point is that once this occurs, it is difficult to eliminate the cycle  using anything but a maximally effective acid suppression regimen at least in the short run, accompanied by an appropriate diet to address non acid GERD and control / eliminate the cough itself for at least 3 - 5 days with demerol in this case as so highly intolerant of all the other analgesics and has MSCP on R ant chest wall that is intolerable plus solumedrol 120 mg   in case of component of Th-2 driven upper or lower airways inflammation (if cough responds short term only to relapse before return while will on full rx for uacs (as above), then that would point to allergic rhinitis/ asthma or eos bronchitis as alternative dx)     F/u next week in RDS if not better       Each maintenance medication was reviewed in detail including emphasizing most importantly the difference between maintenance and prns and under what circumstances the prns are to be triggered using an action plan format where appropriate.  Total time for H and P, chart review, counseling, reviewing hfa  device(s) and generating customized AVS unique to this office visit / same day charting = 64 min new pt eval   for multiple  refractory respiratory  symptoms of uncertain etiology

## 2024-01-13 NOTE — Patient Instructions (Addendum)
 The key to effective treatment for your cough is eliminating the non-stop cycle of cough you're stuck in long enough to let your airway heal completely and then see if there is anything still making you cough once you stop the cough suppression, but this should take no more than 5 days to figure out  First take delsym two tsp every 12 hours and supplement if needed with  Demerol  50 mg up to 1-  2 every 4 hours to suppress the urge to cough at all or even clear your throat. Swallowing water  or using ice chips/non mint and menthol containing candies (such as lifesavers or sugarless jolly ranchers) are also effective.  You should rest your voice and avoid activities that you know make you cough.  Once you have eliminated the cough for 3 straight days try reducing the tramadol first,  then the delsym as tolerated.    Prednisone  10 mg take  4 each am x 2 days,   2 each am x 2 days,  1 each am x 2 days and stop (this is to eliminate allergies and inflammation from coughing)  Protonix (pantoprazole) Take 30-60 min before first meal of the day and Pepcid 20 mg one bedtime plus chlorpheniramine 4 mg x 1-2 at bedtime (both available over the counter)  until cough is completely gone for at least a week without the need for cough suppression  GERD (REFLUX)  is an extremely common cause of respiratory symptoms, many times with no significant heartburn at all.    It can be treated with medication, but also with lifestyle changes including avoidance of late meals, excessive alcohol, smoking cessation, and avoid fatty foods, chocolate, peppermint, colas, red wine, and acidic juices such as orange juice.  NO MINT OR MENTHOL PRODUCTS SO NO COUGH DROPS (luden's pectin)  USE HARD CANDY INSTEAD (jolley ranchers or Stover's or Lifesavers (all available in sugarless versions) NO OIL BASED VITAMINS - use powdered substitutes.  NO OILS while coughing  Please remember to go to the  x-ray department  for your tests - we will  call you with the results when they are available    Please remember to go to the lab department   for your tests - we will call you with the results when they are availabl

## 2024-01-13 NOTE — Progress Notes (Signed)
 Brenda King, female    DOB: 06-Apr-1960   MRN: [id number]   Brief patient profile:  63  yowf quit smoking 2023 and vaped until end of Sept 20025  sefl -referred to pulmonary clinic 01/13/2024 for cough x 1st week in Sept 2025 failed augmenti/pred/hydrocodone        Pt not previously seen by [facility] service.     History of Present Illness  01/13/2024  Pulmonary/ 1st office eval/Keeshawn Fakhouri   Chief Complaint  Patient presents with   Consult    Symptoms started since September.   Cough    Constant coughing causing side pain. Productive cough with phlegm   Shortness of Breath  Dyspnea: sensation of  wt on chest midline x 2 months and R lower ant cp x 7 days- the latter pleuritic and the midline not Cough: slt yellow better p levaquin and pred oct 26th  and levaquin finished  01/12/24  Sleep: almost 90 degrees  SABA use: no better  02 ldz:wnwz     No obvious day to day or daytime pattern/variability or assoc excess/ purulent sputum or mucus plugs or hemoptysis or  subjective wheeze or overt sinus or hb symptoms.    Also denies any obvious fluctuation of symptoms with weather or environmental changes or other aggravating or alleviating factors except as outlined above   No unusual exposure hx or h/o childhood pna/ asthma or knowledge of premature birth.  Current Allergies, Complete Past Medical History, Past Surgical History, Family History, and Social History were reviewed in [employer] record.  ROS  The following are not active complaints unless bolded Hoarseness, sore throat, dysphagia, dental problems, itching, sneezing,  nasal congestion or discharge of excess mucus or purulent secretions, ear ache,   fever, chills, sweats, unintended wt loss or wt gain, classically  exertional cp,  orthopnea pnd or arm/hand swelling  or leg swelling, presyncope, palpitations, abdominal pain, anorexia, nausea, vomiting, diarrhea  or change in bowel habits or change in bladder  habits, change in stools or change in urine, dysuria, hematuria,  rash, arthralgias, visual complaints, headache, numbness, weakness or ataxia or problems with walking or coordination,  change in mood or  memory.             Outpatient Medications Prior to Visit  Medication Sig Dispense Refill   ALPRAZolam  (XANAX ) 0.5 MG tablet Take 0.5 mg by mouth as needed. Taking as needed related to menopause symptoms     aspirin EC 81 MG tablet Take 81 mg by mouth daily. Swallow whole.     escitalopram (LEXAPRO) 10 MG tablet Take 10 mg by mouth daily.     estradiol (ESTRACE) 2 MG tablet Take 2 mg by mouth daily.     lidocaine  (LIDODERM ) 5 % Place 3 patches onto the skin daily. Remove & Discard patch within 12 hours or as directed by MD 90 patch 11   progesterone (PROMETRIUM) 100 MG capsule Take 100 mg by mouth at bedtime.     Rimegepant Sulfate (NURTEC) 75 MG TBDP Take 1 tablet (75 mg total) by mouth daily as needed. For migraines. Take as close to onset of migraine as possible. One daily maximum. Please use copay card: BIN [id number] PCN CN GRP [id number] ID [id number] EXP 03/17/2024 16 tablet 11   albuterol  (VENTOLIN  HFA) 108 (90 Base) MCG/ACT inhaler Inhale 1-2 puffs into the lungs every 6 (six) hours as needed for wheezing or shortness of breath. 18 g 0   EQL EVENING PRIMROSE OIL  PO Take by mouth.     levofloxacin (LEVAQUIN) 750 MG tablet Take 1 tablet (750 mg total) by mouth daily. (Patient not taking: Reported on 01/13/2024) 7 tablet 0   amoxicillin -clavulanate (AUGMENTIN ) 875-125 MG tablet Take 1 tablet by mouth every 12 (twelve) hours. 14 tablet 0   Aspirin-Acetaminophen -Caffeine (EXCEDRIN MIGRAINE PO) Take by mouth as needed. (Patient not taking: Reported on 01/13/2024)     gabapentin  (NEURONTIN ) 400 MG capsule TAKE 1 CAPSULE BY MOUTH 3 TIMES DAILY. (Patient not taking: Reported on 01/13/2024) 90 capsule 3   HYDROcodone  bit-homatropine (HYCODAN) 5-1.5 MG/5ML syrup Take 5 mLs by mouth every 8 (eight)  hours as needed for cough. (Patient not taking: Reported on 01/13/2024) 120 mL 0   Rimegepant Sulfate (NURTEC PO) Take 75 mg by mouth as needed.     saccharomyces boulardii (FLORASTOR) 250 MG capsule Take 1 capsule (250 mg total) by mouth 2 (two) times daily. (Patient not taking: Reported on 01/13/2024) 60 capsule 1   tretinoin (RETIN-A) 0.05 % cream  (Patient not taking: Reported on 01/13/2024)     No facility-administered medications prior to visit.    Past Medical History:  Diagnosis Date   Anxiety    Atrial fibrillation [facility])    Status post ablation 2004 and 2005 - Dr. Epifanio [facility])   Constipation    Degenerative disc disease, cervical    Menopause    Nodular infiltrative basal cell carcinoma (BCC) 07/04/2021   Left Shoulder      Objective:     BP 129/81   Pulse 74   Temp 97.6 F (36.4 C) (Oral)   Ht 5' 9 (1.753 m)   Wt 171 lb (77.6 kg)   SpO2 94%   BMI 25.25 kg/m   SpO2: 94 % RA     Amb wf harsh barking cough  end of exp/ clutching R anterior chest when coughing    HEENT : Oropharynx  clear      Nasal turbinates nl limited exam    NECK :  without  apparent JVD/ palpable Nodes/TM    LUNGS: no acc muscle use,  Nl contour chest which is clear to A and P bilaterally without cough on insp or exp maneuvers   CV:  RRR  no s3 or murmur or increase in P2, and no edema   ABD:  soft and nontender   MS:  Gait nl   ext warm without deformities Or obvious joint restrictions  calf tenderness, cyanosis or clubbing    SKIN: warm and dry without lesions    NEURO:  alert, approp, nl sensorium with  no motor or cerebellar deficits apparent.    CXR PA and Lateral:   01/13/2024 :    I personally reviewed images and impression is as follows:     No infiltrates   Labs ordered/ reviewed:      Chemistry      Component Value Date/Time   NA 140 01/13/2024 1146   NA 140 01/01/2023 1206   K 4.8 01/13/2024 1146   CL 101 01/13/2024 1146   CO2 30 01/13/2024 1146    BUN 11 01/13/2024 1146   BUN 8 01/01/2023 1206   CREATININE 0.80 01/13/2024 1146   CREATININE 0.65 06/15/2013 1234      Component Value Date/Time   CALCIUM 10.0 01/13/2024 1146   ALKPHOS 85 01/01/2023 1206   AST 18 01/01/2023 1206   ALT 15 01/01/2023 1206   BILITOT <0.2 01/01/2023 1206  Lab Results  Component Value Date   WBC 9.2 01/13/2024   HGB 14.6 01/13/2024   HCT 44.2 01/13/2024   MCV 96.0 01/13/2024   PLT 454.0 (H) 01/13/2024       Lab Results  Component Value Date   PROBNP 48.0 01/13/2024       Lab Results  Component Value Date   ESRSEDRATE 5 01/13/2024           Assessment   Assessment & Plan DOE (dyspnea on exertion) Exam clear/ cxr ok 01/13/2024  - DOE labs 01/13/2024 >>>ok / lungs clear   Upper airway cough syndrome Onset 1st week in Sept 2025  -  Allergy screen 01/13/2024 >  Eos 0. /  IgE  0.3 several days off prednisone   -  cyclical cough rx 01/13/2024 >>>  Of the three most common causes of  Sub-acute / recurrent or chronic cough, only one (GERD)  can actually contribute to/ trigger  the other two (asthma and post nasal drip syndrome)  and perpetuate the cylce of cough.  While not intuitively obvious, many patients with chronic low grade reflux do not cough until there is a primary insult that disturbs the protective epithelial barrier and exposes sensitive nerve endings.   This is typically viral but can due to PNDS and  either may apply here.    >>>  The point is that once this occurs, it is difficult to eliminate the cycle  using anything but a maximally effective acid suppression regimen at least in the short run, accompanied by an appropriate diet to address non acid GERD and control / eliminate the cough itself for at least 3 - 5 days with demerol in this case as so highly intolerant of all the other analgesics and has MSCP on R ant chest wall that is intolerable plus solumedrol 120 mg   in case of component of Th-2 driven upper or lower  airways inflammation (if cough responds short term only to relapse before return while will on full rx for uacs (as above), then that would point to allergic rhinitis/ asthma or eos bronchitis as alternative dx)     F/u next week in RDS if not better       Each maintenance medication was reviewed in detail including emphasizing most importantly the difference between maintenance and prns and under what circumstances the prns are to be triggered using an action plan format where appropriate.  Total time for H and P, chart review, counseling, reviewing hfa  device(s) and generating customized AVS unique to this office visit / same day charting = 64 min new pt eval   for multiple  refractory respiratory  symptoms of uncertain etiology          AVS  Patient Instructions  The key to effective treatment for your cough is eliminating the non-stop cycle of cough you're stuck in long enough to let your airway heal completely and then see if there is anything still making you cough once you stop the cough suppression, but this should take no more than 5 days to figure out  First take delsym two tsp every 12 hours and supplement if needed with  Demerol  50 mg up to 1-  2 every 4 hours to suppress the urge to cough at all or even clear your throat. Swallowing water  or using ice chips/non mint and menthol containing candies (such as lifesavers or sugarless jolly ranchers) are also effective.  You should rest your voice and avoid  activities that you know make you cough.  Once you have eliminated the cough for 3 straight days try reducing the tramadol first,  then the delsym as tolerated.    Prednisone  10 mg take  4 each am x 2 days,   2 each am x 2 days,  1 each am x 2 days and stop (this is to eliminate allergies and inflammation from coughing)  Protonix (pantoprazole) Take 30-60 min before first meal of the day and Pepcid 20 mg one bedtime plus chlorpheniramine 4 mg x 1-2 at bedtime (both available over  the counter)  until cough is completely gone for at least a week without the need for cough suppression  GERD (REFLUX)  is an extremely common cause of respiratory symptoms, many times with no significant heartburn at all.    It can be treated with medication, but also with lifestyle changes including avoidance of late meals, excessive alcohol, smoking cessation, and avoid fatty foods, chocolate, peppermint, colas, red wine, and acidic juices such as orange juice.  NO MINT OR MENTHOL PRODUCTS SO NO COUGH DROPS (luden's pectin)  USE HARD CANDY INSTEAD (jolley ranchers or Stover's or Lifesavers (all available in sugarless versions) NO OIL BASED VITAMINS - use powdered substitutes.  NO OILS while coughing  Please remember to go to the  x-ray department  for your tests - we will call you with the results when they are available    Please remember to go to the lab department   for your tests - we will call you with the results when they are availabl             Ozell America, MD 01/13/2024

## 2024-01-13 NOTE — Assessment & Plan Note (Addendum)
 Exam clear/ cxr ok 01/13/2024  - DOE labs 01/13/2024 >>>ok / lungs clear

## 2024-01-13 NOTE — Telephone Encounter (Signed)
*  Pulm  Pharmacy Patient Advocate Encounter   Received notification from CoverMyMeds that prior authorization for Meperidine HCl 50MG  tablets  is required/requested.   Insurance verification completed.   The patient is insured through [facility].   PA not needed- insurance max coverage of 18 tablets per 25 days.

## 2024-01-14 LAB — D-DIMER, QUANTITATIVE: D-Dimer, Quant: 0.77 ug{FEU}/mL — ABNORMAL HIGH (ref <0.50)

## 2024-01-14 LAB — IGE: IgE (Immunoglobulin E), Serum: 98 kU/L (ref <=114)

## 2024-01-14 NOTE — Telephone Encounter (Signed)
 Patient has not picked up Meperidine, insurance will only cover 18 tabs per 25 days. How would you like to proceed?  Patient wants to know if platelet count is something she needs to be worried about at 454?  Did you reach out to Dr. Malcolm surgery and needed to hold off for now?

## 2024-01-15 ENCOUNTER — Ambulatory Visit: Payer: Self-pay | Admitting: Internal Medicine

## 2024-01-15 NOTE — Telephone Encounter (Signed)
 Per Dr. Darlean:  1) copy my ov to Dr Malcolm   2) she should pick up however many her insurance will pay for and if they need a new rx with that specific number I can do that but critical she take enough demerol to stop her non-stop cycle of coughing  Dr Darlean- before I call the pt back will you please advise on her concern about her platelet count? She has looked at the labs on mychart. Thanks.

## 2024-01-20 ENCOUNTER — Ambulatory Visit: Admitting: Neurology

## 2024-01-20 NOTE — Telephone Encounter (Signed)
 Brenda Ozell NOVAK, MD to Me     01/15/24 10:42 AM It's fine - tends to go up when acutely ill / stressed etc and I wouldn't worry about it but should be checked at next physical  Sent the pt msg via mychart with this info and copy of her ov note routed to Dr. Victory Boers.

## 2024-01-29 ENCOUNTER — Ambulatory Visit: Admitting: Internal Medicine

## 2024-02-02 MED ORDER — METHYLPREDNISOLONE ACETATE 80 MG/ML IJ SUSP
120.0000 mg | Freq: Once | INTRAMUSCULAR | Status: AC
  Administered 2024-02-02: 120 mg via INTRAMUSCULAR

## 2024-02-02 NOTE — Addendum Note (Signed)
 Addended by: MOODY RANCHER on: 02/02/2024 05:02 PM   Modules accepted: Orders

## 2024-02-04 ENCOUNTER — Ambulatory Visit: Payer: Self-pay | Admitting: Internal Medicine

## 2024-02-04 ENCOUNTER — Ambulatory Visit: Admitting: Pulmonary Disease

## 2024-02-04 ENCOUNTER — Encounter: Payer: Self-pay | Admitting: Pulmonary Disease

## 2024-02-04 VITALS — BP 112/76 | HR 71 | Temp 97.6°F | Ht 69.0 in | Wt 169.0 lb

## 2024-02-04 DIAGNOSIS — R091 Pleurisy: Secondary | ICD-10-CM | POA: Diagnosis not present

## 2024-02-04 DIAGNOSIS — J40 Bronchitis, not specified as acute or chronic: Secondary | ICD-10-CM | POA: Diagnosis not present

## 2024-02-04 MED ORDER — AZITHROMYCIN 250 MG PO TABS: ORAL_TABLET | ORAL | 0 refills | Status: AC

## 2024-02-04 MED ORDER — PREDNISONE 20 MG PO TABS: ORAL_TABLET | ORAL | 0 refills | Status: DC

## 2024-02-04 MED ORDER — BREZTRI AEROSPHERE 160-9-4.8 MCG/ACT IN AERO
2.0000 | INHALATION_SPRAY | Freq: Two times a day (BID) | RESPIRATORY_TRACT | Status: DC
Start: 2024-02-04 — End: 2024-02-11

## 2024-02-04 NOTE — Telephone Encounter (Signed)
 Ok  to do Augmentin  875 mg take one pill twice daily  X 10 days - take at breakfast and supper with large glass of water .  It would help reduce the usual side effects (diarrhea and yeast infections) if you ate cultured yogurt at lunch.   And Prednisone  10 mg take  4 each am x 2 days,   2 each am x 2 days,  1 each am x 2 days and stop   Find out if she ever took demerol to supplement the delsym for the record but can't do more s ov .  Also could now offer tessalon  200 to supplement the deslym up to 4 x daily

## 2024-02-04 NOTE — Progress Notes (Signed)
 @Patient  ID: Brenda King, female    DOB: 05-Jun-1960, 63 y.o.   MRN: [id number]  Chief Complaint  Patient presents with   Cough    Worsening cough (green sputum). No improvement,    Referring provider: Cook, Jayce G, DO  HPI:   63 y.o. woman former smoker now with several weeks of recurrent bronchitis symptoms reliably improved with antibiotics and steroids here for an acute visit with similar symptoms increased cough green sputum.  Most recent pulmonary note reviewed.  Patient states symptoms onset about 10 weeks ago.  Cough, chest pain related to musculoskeletal soreness after coughing.  At times.  Green sputum.  Has reliably improved with several courses of antibiotics and steroids.  But symptoms recur after a few days.  Has used albuterol  with minimal improvement.  Not on any controller medicine.  Chest x-ray on serial exams are clear on my review interpretation.  Most recent chest x-ray shows signs of hyperinflation with flattened diaphragms on the PA and increased lucency between sternum and anterior heart border on lateral film.  Symptoms have recurred.  Now cough and green sputum.  Pleuritic chest pain last night that self resolved.  No further.  No increase in shortness of breath or dyspnea.  We discussed at length given the imaging finding as well as frequent bronchitis with sputum production reliably improved with prednisone  this is an inflammatory process most similar to something like asthma.  We discussed importance of maintenance inhaler to try to maintain symptoms in between bouts of bronchitis and minimize bouts of bronchitis.  We discussed her lab work, eosinophils of 300.  May need injectable medicines to fully control symptoms, cough, to avoid additional steroid courses in the future.  Questionaires / Pulmonary Flowsheets:   ACT:      No data to display          MMRC:     No data to display          Epworth:      No data to display           Tests:   FENO:  No results found for: NITRICOXIDE  PFT:    Latest Ref Rng & Units 01/17/2016    1:52 PM  PFT Results  FVC-Pre L 4.36   FVC-Predicted Pre % 102   FVC-Post L 4.30   FVC-Predicted Post % 101   Pre FEV1/FVC % % 72   Post FEV1/FCV % % 76   FEV1-Pre L 3.16   FEV1-Predicted Pre % 95   FEV1-Post L 3.25   Personally reviewed interpreted as normal spirometry, no bronchodilator response  WALK:      No data to display          Imaging: Personally reviewed and as per EMR and discussion in this note DG Chest 2 View Result Date: 01/14/2024 CLINICAL DATA:  Cough EXAM: CHEST - 2 VIEW COMPARISON:  Chest x-ray performed December 24, 2023 FINDINGS: Subtle hazy opacity is present overlying the left lung base which is similar when compared to October 8 and possibly sequelae of prior scarring. No pleural effusion or pneumothorax. IMPRESSION: 1. No definite plain film evidence of acute cardiopulmonary process. Electronically Signed   By: Maude Naegeli M.D.   On: 01/14/2024 11:40    Lab Results: Personally reviewed CBC    Component Value Date/Time   WBC 9.2 01/13/2024 1146   RBC 4.61 01/13/2024 1146   HGB 14.6 01/13/2024 1146   HGB 13.7 01/01/2023 1206  HCT 44.2 01/13/2024 1146   HCT 41.1 01/01/2023 1206   PLT 454.0 (H) 01/13/2024 1146   PLT 331 01/01/2023 1206   MCV 96.0 01/13/2024 1146   MCV 99 (H) 01/01/2023 1206   MCH 33.0 01/01/2023 1206   MCH 33.1 04/15/2016 0919   MCHC 33.1 01/13/2024 1146   RDW 13.6 01/13/2024 1146   RDW 12.0 01/01/2023 1206   LYMPHSABS 3.7 01/13/2024 1146   LYMPHSABS 1.7 01/01/2023 1206   MONOABS 0.6 01/13/2024 1146   EOSABS 0.3 01/13/2024 1146   EOSABS 0.1 01/01/2023 1206   BASOSABS 0.1 01/13/2024 1146   BASOSABS 0.1 01/01/2023 1206    BMET    Component Value Date/Time   NA 140 01/13/2024 1146   NA 140 01/01/2023 1206   K 4.8 01/13/2024 1146   CL 101 01/13/2024 1146   CO2 30 01/13/2024 1146   GLUCOSE 80 01/13/2024 1146    BUN 11 01/13/2024 1146   BUN 8 01/01/2023 1206   CREATININE 0.80 01/13/2024 1146   CREATININE 0.65 06/15/2013 1234   CALCIUM 10.0 01/13/2024 1146   GFRNONAA 89 09/16/2019 1442   GFRAA 103 09/16/2019 1442    BNP No results found for: BNP  ProBNP    Component Value Date/Time   PROBNP 48.0 01/13/2024 1146    Specialty Problems       Pulmonary Problems   Allergic rhinitis   Respiratory infection   DOE (dyspnea on exertion)   Upper airway cough syndrome   Onset 1st week in Sept 2025  -  Allergy screen 01/13/2024 >  Eos 0. /  IgE   -  cyclical cough rx 01/13/2024 >>>       Allergies  Allergen Reactions   Docusate Sodium Hives and Shortness Of Breath   Miralax [Polyethylene Glycol (Macrogol)] Hives and Shortness Of Breath   Other Hives    ANY laxative with LAX   Bisacodyl Other (See Comments)   Chlorzoxazone      Migraine headache and nausea   Golytely [Peg 3350-Kcl-Nabcb-Nacl-Nasulf]    Latex Itching   Qulipta  [Atogepant ]     Due to containing a form of polyethylene glycol   Topamax  [Topiramate ]     nausea    Immunization History  Administered Date(s) Administered   Tdap 12/20/2016    Past Medical History:  Diagnosis Date   Anxiety    Atrial fibrillation [facility])    Status post ablation 2004 and 2005 - Dr. Epifanio [facility])   Constipation    Degenerative disc disease, cervical    Menopause    Nodular infiltrative basal cell carcinoma (BCC) 07/04/2021   Left Shoulder    Tobacco History: Social History   Tobacco Use  Smoking Status Former   Current packs/day: 0.25   Average packs/day: 0.3 packs/day for 30.0 years (7.5 ttl pk-yrs)   Types: Cigarettes  Smokeless Tobacco Former   Quit date: 11/04/2012   Counseling given: Not Answered   Continue to not smoke  Outpatient Encounter Medications as of 02/04/2024  Medication Sig   ALPRAZolam  (XANAX ) 0.5 MG tablet Take 0.5 mg by mouth as needed. Taking as needed related to menopause symptoms    aspirin EC 81 MG tablet Take 81 mg by mouth daily. Swallow whole.   azithromycin  (ZITHROMAX ) 250 MG tablet Take 2 tablets (500 mg total) by mouth daily for 1 day, THEN 1 tablet (250 mg total) daily for 4 days.   budesonide-glycopyrrolate-formoterol (BREZTRI AEROSPHERE) 160-9-4.8 MCG/ACT AERO inhaler Inhale 2 puffs into the lungs in the morning and  at bedtime.   escitalopram (LEXAPRO) 10 MG tablet Take 10 mg by mouth daily.   estradiol (ESTRACE) 2 MG tablet Take 2 mg by mouth daily.   famotidine (PEPCID) 20 MG tablet One after supper   pantoprazole (PROTONIX) 40 MG tablet Take 1 tablet (40 mg total) by mouth daily. Take 30-60 min before first meal of the day   predniSONE  (DELTASONE ) 20 MG tablet Take 2 tablets (40 mg total) by mouth daily with breakfast for 5 days, THEN 1 tablet (20 mg total) daily with breakfast for 5 days.   progesterone (PROMETRIUM) 100 MG capsule Take 100 mg by mouth at bedtime.   Rimegepant Sulfate (NURTEC) 75 MG TBDP Take 1 tablet (75 mg total) by mouth daily as needed. For migraines. Take as close to onset of migraine as possible. One daily maximum. Please use copay card: BIN [id number] PCN CN GRP [id number] ID [id number] EXP 03/17/2024   lidocaine  (LIDODERM ) 5 % Place 3 patches onto the skin daily. Remove & Discard patch within 12 hours or as directed by MD (Patient not taking: Reported on 02/04/2024)   meperidine (DEMEROL) 50 MG tablet Take 1 tablet (50 mg total) by mouth every 4 (four) hours as needed for severe pain (pain score 7-10). (Patient not taking: Reported on 02/04/2024)   No facility-administered encounter medications on file as of 02/04/2024.     Review of Systems  Review of Systems  No chest pain with exertion.  No orthopnea or PND.  Comprehensive review of systems otherwise negative. Physical Exam  BP 112/76   Pulse 71   Temp 97.6 F (36.4 C) (Oral)   Ht 5' 9 (1.753 m)   Wt 169 lb (76.7 kg)   SpO2 95%   BMI 24.96 kg/m   Wt Readings from Last 5  Encounters:  02/04/24 169 lb (76.7 kg)  01/13/24 171 lb (77.6 kg)  01/06/24 169 lb (76.7 kg)  12/24/23 170 lb (77.1 kg)  05/28/23 166 lb 3.2 oz (75.4 kg)    BMI Readings from Last 5 Encounters:  02/04/24 24.96 kg/m  01/13/24 25.25 kg/m  01/06/24 24.96 kg/m  12/24/23 25.10 kg/m  05/28/23 24.54 kg/m     Physical Exam General: Sitting in chair in no distress Eyes: EOMI Neck: No JVP Pulmonary: Clear, normal work of breathing, good air excursion Abdomen: Nondistended   Assessment & Plan:   Acute on chronic cough: Recurrent bouts of essentially bronchitis-like symptoms for the last 10 weeks.  Reliably improved with antibiotics and steroids.  Chest imaging clear, not consistent with a bacterial infection.  Chest exam clear today.  Suspect steroid is much more beneficial.  Unclear why but high suspicion for activation of underlying asthma given discussion below.  Addition of Z-Pak course, prednisone  taper today.  Asthma: Clinical diagnosis with recurrent bouts of bronchitis.  Eosinophils elevated to 300.  Chest x-ray with hyperinflation, minimal smoking history but could be related that as opposed to asthma.  Start maintenance inhaler, Breztri provided today via samples.  If helpful can prescribe.  Co-pay card provided.  If Lorraine does not improve things I think she needs biologic therapy, IL-5 injections.  Defer to primary pulmonologist pending improvement.  Pleurisy: Present on the left, discrete episode last night.  No longer happening.  Suspect related to inflammation related to asthma, bronchitis as discussed above.   Return in about 4 weeks (around 03/03/2024) for with Dr. Darlean.   Donnice JONELLE Beals, MD 02/04/2024   This appointment required 42 minutes of patient care (  this includes precharting, chart review, review of results, face-to-face care, etc.).

## 2024-02-04 NOTE — Telephone Encounter (Signed)
 Pt is scheduled with [facility] for acute ov this afternoon.

## 2024-02-04 NOTE — Telephone Encounter (Signed)
 Brenda King to Me (Selected Message)     02/03/24 10:27 AM Hi I was curious if maybe you could let Dr Darlean know that still having cough not as bad but the phlegm is still there waking up last two days throat getting sore and seems cough was better but coming back and it is not painful like it was .  It does stop me in my tracks with my back pain when I cough I'm just wanting touch base with him bc so ready for this cough to go but seems like it's wanting to come back a little each day. I'm still taking all the meds and his directions he gave me. The phlegm this morning has yellow color again . Thank you again ,   Kindest regards ,  Brenda Blow

## 2024-02-04 NOTE — Patient Instructions (Signed)
 Nice to meet you  I think you develop something like asthma based on your chronic bronchitis symptoms keep coming back.  Especially based on your chest x-ray and the fact that steroids and antibiotics reliably help.  Take prednisone  and antibiotic as prescribed  Use Breztri 2 puffs twice a day every day.  Rinse your mouth out the every use with water .  If you find this beneficial as no we can prescribe it long-term.  I provided samples.  Each sample inhaler will last 7 days.  Your eosinophils were elevated on your blood work.  Often times this can cause poorly controlled bronchitis symptoms and we need injection medicines to treat this.  Return to clinic in 3 to 4 weeks with Dr. Darlean

## 2024-02-04 NOTE — Telephone Encounter (Signed)
 Nothing further needed

## 2024-02-04 NOTE — Telephone Encounter (Signed)
  FYI Only or Action Required?: FYI only for provider: appointment scheduled on 02/04/2024.  Patient is followed in Pulmonology for URI, last seen on 01/13/2024 by Darlean Ozell NOVAK, MD.  Called Nurse Triage reporting Cough.  Symptoms began several months ago.  Interventions attempted: Prescription medications: antibiotics, steroids, and PPI.  Symptoms are: gradually worsening.  Triage Disposition: See PCP When Office is Open (Within 3 Days)  Patient/caregiver understands and will follow disposition?: Yes Copied from CRM [id number]. Topic: Clinical - Red Word Triage >> Feb 04, 2024  1:02 PM Abigail D wrote: Red Word that prompted transfer to Nurse Triage: Discoloration of mucus: Patient seen last on 10/28 - It seemed like she was getting a little better but now the cough & green phlegm is back. It's been going on 2 months. She said it has been getting worse, she couldn't hardly sleep last night. She has been following Dr. Chari instructions but hasn't had any success. Reason for Disposition  Cough has been present for > 3 weeks  Answer Assessment - Initial Assessment Questions Has had 2 rounds of steroids and antibiotics, but cough persists Reports that she has followed all of Dr. Chari instruction, but the cough has not resolved.  Cough improved for a while, but now has a productive cough again  1. ONSET: When did the cough begin?      September, 2025 2. SEVERITY: How bad is the cough today?       3. SPUTUM: Describe the color of your sputum (e.g., none, dry cough; clear, white, yellow, green)     green 4. HEMOPTYSIS: Are you coughing up any blood? If Yes, ask: How much? (e.g., flecks, streaks, tablespoons, etc.)     denies 5. DIFFICULTY BREATHING: Are you having difficulty breathing? If Yes, ask: How bad is it? (e.g., mild, moderate, severe)      denies 6. FEVER: Do you have a fever? If Yes, ask: What is your temperature, how was it measured, and when did it  start?     denies 7. CARDIAC HISTORY: Do you have any history of heart disease? (e.g., heart attack, congestive heart failure)      No documented history 8. LUNG HISTORY: Do you have any history of lung disease?  (e.g., pulmonary embolus, asthma, emphysema)     No documented history 9. PE RISK FACTORS: Do you have a history of blood clots? (or: recent major surgery, recent prolonged travel, bedridden)     none 10. OTHER SYMPTOMS: Do you have any other symptoms? (e.g., runny nose, wheezing, chest pain)       Runny nose, chest soreness with excessive cough, sore throat 11. PREGNANCY: Is there any chance you are pregnant? When was your last menstrual period?       N/a 12. TRAVEL: Have you traveled out of the country in the last month? (e.g., travel history, exposures)       N/a  Protocols used: Cough - Acute Productive-A-AH

## 2024-02-11 ENCOUNTER — Encounter: Payer: Self-pay | Admitting: Internal Medicine

## 2024-02-11 ENCOUNTER — Ambulatory Visit (INDEPENDENT_AMBULATORY_CARE_PROVIDER_SITE_OTHER): Admitting: Internal Medicine

## 2024-02-11 VITALS — BP 118/74 | HR 79 | Ht 69.0 in | Wt 170.0 lb

## 2024-02-11 DIAGNOSIS — R058 Other specified cough: Secondary | ICD-10-CM

## 2024-02-11 LAB — NITRIC OXIDE

## 2024-02-11 MED ORDER — AMOXICILLIN-POT CLAVULANATE 875-125 MG PO TABS: 1.0000 | ORAL_TABLET | Freq: Two times a day (BID) | ORAL | 0 refills | Status: AC

## 2024-02-11 NOTE — Patient Instructions (Addendum)
 Stop breztri  and all cough syrups  Mucinex dm 1200 mg every 12 hours and supplement with tramadol 50 mg   1-2 every 4 hours   Continue  Pantoprazole  (protonix ) 40 mg   Take  30-60 min before first meal of the day and Pepcid  (famotidine )  20 mg after supper until return to office - this is the best way to tell whether stomach acid is contributing to your problem.    Bed blocks or risers x 6-8 in   Augmentin  875 mg take one pill twice daily  X 10 days - take at breakfast and supper with large glass of water .  It would help reduce the usual side effects (diarrhea and yeast infections) if you ate cultured yogurt at lunch.    My office will be contacting you by phone for referral to sinus CT   - if you don't hear back from my office within one week please call us  back or notify us  thru MyChart and we'll address it right away.   Please schedule a follow up office visit in 4 weeks, sooner if needed  all meds including over the counters

## 2024-02-11 NOTE — Progress Notes (Unsigned)
 Brenda King, female    DOB: [birth year]   MRN: [id number]   Brief patient profile:  63  yowf quit smoking 2023 and vaped until end of Sept 20025  sefl -referred to pulmonary clinic 01/13/2024 for cough x 1st week in Sept 2025 failed augmenti/pred/hydrocodone        Pt not previously seen by [facility] service.     History of Present Illness  01/13/2024  Pulmonary/ 1st office eval/Brenda King   Chief Complaint  Patient presents with   Consult    Symptoms started since September.   Cough    Constant coughing causing side pain. Productive cough with phlegm   Shortness of Breath  Dyspnea: sensation of  wt on chest midline x 2 months and R lower ant cp x 7 days- the latter pleuritic and the midline not Cough: slt yellow better p levaquin  and pred oct 26th  and levaquin  finished  01/12/24  Sleep: almost 90 degrees  SABA use: no better  02 ldz:wnwz  Patient Instructions  The key to effective treatment for your cough is eliminating the non-stop cycle of cough you're stuck in long enough to let your airway heal completely and then see if there is anything still making you cough once you stop the cough suppression, but this should take no more than 5 days to figure out First take delsym two tsp every 12 hours and supplement if needed with  Demerol   50 mg up to 1-  2 every 4 hours to suppress the urge to cough at all or even clear your throat. Swallowing water  or using ice chips/non mint and menthol containing candies (such as lifesavers or sugarless jolly ranchers) are also effective.  You should rest your voice and avoid activities that you know make you cough. Once you have eliminated the cough for 3 straight days try reducing the tramadol first,  then the delsym as tolerated.   Prednisone  10 mg take  4 each am x 2 days,   2 each am x 2 days,  1 each am x 2 days and stop (this is to eliminate allergies and inflammation from coughing) Protonix  (pantoprazole ) Take 30-60 min before first meal of the day  and Pepcid  20 mg one bedtime plus chlorpheniramine 4 mg x 1-2 at bedtime (both available over the counter)  until cough is completely gone for at least a week without the need for cough suppression  GERD diet reviewed, bed blocks rec      02/11/2024  f/u ov/[name] re: cough x 1st week in September 2025  maint on gerd rx plus started breztri  but no better  Chief Complaint  Patient presents with   Follow-up   Dyspnea:  chased grandchildren made her cough  Cough: worse with laughing  yellowish / greenish very thick (not reproduced at ov where cough was dry sounding and harsh)  Sleeping: bed is flat / 4 pillows  resp cc  SABA use: none  02: none  No longer having midline or R pleurtic  and now with new since last ov =  L ant  cp  x 15 sec usually  at rest not noct or pleuritic or exertional   Lung cancer screening :  does not meed criteria for pk-yrs     No obvious day to day or daytime variability or assoc  r mucus plugs or hemoptysis or cp or chest tightness, subjective wheeze or overt sinus or hb symptoms.    Also denies any obvious fluctuation of symptoms  with weather or environmental changes or other aggravating or alleviating factors except as outlined above   No unusual exposure hx or h/o childhood pna/ asthma or knowledge of premature birth.  Current Allergies, Complete Past Medical History, Past Surgical History, Family History, and Social History were reviewed in [employer] record.  ROS  The following are not active complaints unless bolded Hoarseness, sore throat, dysphagia, dental problems, itching, sneezing,  nasal congestion or discharge of excess mucus or purulent secretions, ear ache,   fever, chills, sweats, unintended wt loss or wt gain, classically pleuritic or exertional cp,  orthopnea pnd or arm/hand swelling  or leg swelling, presyncope, palpitations, abdominal pain, anorexia, nausea, vomiting, diarrhea  or change in bowel habits or change in  bladder habits, change in stools or change in urine, dysuria, hematuria,  rash, arthralgias, visual complaints, headache, numbness, weakness or ataxia or problems with walking or coordination,  change in mood or  memory.        Current Meds  Medication Sig   ALPRAZolam  (XANAX ) 0.5 MG tablet Take 0.5 mg by mouth as needed. Taking as needed related to menopause symptoms   aspirin EC 81 MG tablet Take 81 mg by mouth daily. Swallow whole.   budesonide-glycopyrrolate-formoterol (BREZTRI  AEROSPHERE) 160-9-4.8 MCG/ACT AERO inhaler Inhale 2 puffs into the lungs in the morning and at bedtime.   escitalopram (LEXAPRO) 10 MG tablet Take 10 mg by mouth daily.   estradiol (ESTRACE) 2 MG tablet Take 2 mg by mouth daily.   famotidine  (PEPCID ) 20 MG tablet One after supper   pantoprazole  (PROTONIX ) 40 MG tablet Take 1 tablet (40 mg total) by mouth daily. Take 30-60 min before first meal of the day   predniSONE  (DELTASONE ) 20 MG tablet Take 2 tablets (40 mg total) by mouth daily with breakfast for 5 days, THEN 1 tablet (20 mg total) daily with breakfast for 5 days.   progesterone (PROMETRIUM) 100 MG capsule Take 100 mg by mouth at bedtime.   Rimegepant Sulfate (NURTEC) 75 MG TBDP Take 1 tablet (75 mg total) by mouth daily as needed. For migraines. Take as close to onset of migraine as possible. One daily maximum. Please use copay card: BIN [id number] PCN CN GRP [id number] ID [id number] EXP 03/17/2024           Past Medical History:  Diagnosis Date   Anxiety    Atrial fibrillation [facility])    Status post ablation 2004 and 2005 - Dr. Epifanio [facility])   Constipation    Degenerative disc disease, cervical    Menopause    Nodular infiltrative basal cell carcinoma (BCC) 07/04/2021   Left Shoulder      Objective:     Wt Readings from Last 3 Encounters:  02/11/24 170 lb (77.1 kg)  02/04/24 169 lb (76.7 kg)  01/13/24 171 lb (77.6 kg)      Vital signs reviewed  02/11/2024  - Note at rest 02 sats  93% on  RA   General appearance:    amb wf nad/ harsh dry sounding cough on inspiration     HEENT : Oropharynx  clear      Nasal turbinates nl    NECK :  without  apparent JVD/ palpable Nodes/TM    LUNGS: no acc muscle use,  Nl contour chest which is clear to A and P bilaterally     CV:  RRR  no s3 or murmur or increase in P2, and no edema   ABD:  soft and  nontender   MS:  Gait nl   ext warm without deformities Or obvious joint restrictions  calf tenderness, cyanosis or clubbing    SKIN: warm and dry without lesions    NEURO:  alert, approp, nl sensorium with  no motor or cerebellar deficits apparent.     Assessment & Plan Upper airway cough syndrome Onset 1st week in Sept 2025  -  Allergy screen 01/13/2024 >  Eos 0.3 /  IgE  98 -  cyclical cough rx 01/13/2024 >>> did not follow rx   -  FENO  02/11/2024   =  13   Upper airway cough syndrome (previously labeled PNDS),  is so named because it's frequently impossible to sort out how much is  CR/sinusitis with freq throat clearing (which can be related to primary GERD)   vs  causing  secondary ( extra esophageal)  GERD from wide swings in gastric pressure that occur with throat clearing, often  promoting self use of mint and menthol lozenges that reduce the lower esophageal sphincter tone and exacerbate the problem further in a cyclical fashion.   These are the same pts (now being labeled as having irritable larynx syndrome by some cough centers) who not infrequently have a history of having failed to tolerate ace inhibitors,  dry powder inhalers or biphosphonates or report having atypical/extraesophageal reflux symptoms from LPR (globus, throat clearing)  that don't respond to standard doses of PPI  and are easily confused as having aecopd or asthma flares by even experienced allergists/ pulmonologists (myself included).   Rec:  >>> stop all inhalers and OTC cough meds  >>> mucinex dm 1200 mg bid and enough tramadol to achieve 100%  cough elimination x min of 3 days per chronic cough protocol  >>> bed blocks to reduce likelihood of LPR   >>> augmentin  x 10 more days and f/u with sinus CT or ENT eval if sinus CT not covered by insurance  >>> f/u in 4 weeks with all meds in hand using a trust but verify approach to confirm accurate Medication  Reconciliation The principal here is that until we are certain that the  patients are doing what we've asked, it makes no sense to ask them to do more.    Discussed in detail all the  indications, usual  risks and alternatives  relative to the benefits with patient who agrees to proceed with Rx as outlined.  Advised: The standardized cough guidelines published in Chest by Charlie Coder in 2006 are still the best available and consist of a multiple step process (up to 12!) , not a single office visit,  and are intended  to address this problem logically,  with an alogrithm dependent on response to empiric treatment at  each progressive step  to determine a specific diagnosis with  minimal addtional testing needed. Therefore if adherence is an issue or can't be accurately verified,  it's very unlikely the standard evaluation and treatment will be successful here.    Furthermore, response to therapy (other than acute cough suppression, which should only be used short term with avoidance of narcotic containing cough syrups if possible), can be a gradual process for which the patient is not likely to  perceive immediate benefit.  Unlike going to an eye doctor where the best perscription is almost always the first one and is immediately effective, this is almost never the case in the management of chronic cough syndromes. Therefore the patient needs to commit up front to consistently  adhere to recommendations  for up to 6 weeks of therapy directed at the likely underlying problem(s) before the response can be reasonably evaluated.          Each maintenance medication was reviewed in detail  including emphasizing most importantly the difference between maintenance and prns and under what circumstances the prns are to be triggered using an action plan format where appropriate.  Total time for H and P, chart review, counseling, reviewing hfa device(s) and generating customized AVS unique to this office visit / same day charting = 33 min for chronic refractory respiratory  symptoms of uncertain etiology          AVS  Patient Instructions  Stop breztri  and all cough syrups  Mucinex dm 1200 mg every 12 hours and supplement with tramadol 50 mg   1-2 every 4 hours   Continue  Pantoprazole  (protonix ) 40 mg   Take  30-60 min before first meal of the day and Pepcid  (famotidine )  20 mg after supper until return to office - this is the best way to tell whether stomach acid is contributing to your problem.    Bed blocks or risers x 6-8 in   Augmentin  875 mg take one pill twice daily  X 10 days - take at breakfast and supper with large glass of water .  It would help reduce the usual side effects (diarrhea and yeast infections) if you ate cultured yogurt at lunch.    My office will be contacting you by phone for referral to sinus CT   - if you don't hear back from my office within one week please call us  back or notify us  thru MyChart and we'll address it right away.   Please schedule a follow up office visit in 4 weeks, sooner if needed  all meds including over the counters    Ozell America, MD 02/12/2024     Assessment

## 2024-02-12 ENCOUNTER — Ambulatory Visit: Payer: Self-pay | Admitting: Internal Medicine

## 2024-02-12 NOTE — Assessment & Plan Note (Addendum)
 Onset 1st week in Sept 2025  -  Allergy screen 01/13/2024 >  Eos 0.3 /  IgE  98 -  cyclical cough rx 01/13/2024 >>> did not follow rx   -  FENO  02/11/2024   =  13   Upper airway cough syndrome (previously labeled PNDS),  is so named because it's frequently impossible to sort out how much is  CR/sinusitis with freq throat clearing (which can be related to primary GERD)   vs  causing  secondary ( extra esophageal)  GERD from wide swings in gastric pressure that occur with throat clearing, often  promoting self use of mint and menthol lozenges that reduce the lower esophageal sphincter tone and exacerbate the problem further in a cyclical fashion.   These are the same pts (now being labeled as having irritable larynx syndrome by some cough centers) who not infrequently have a history of having failed to tolerate ace inhibitors,  dry powder inhalers or biphosphonates or report having atypical/extraesophageal reflux symptoms from LPR (globus, throat clearing)  that don't respond to standard doses of PPI  and are easily confused as having aecopd or asthma flares by even experienced allergists/ pulmonologists (myself included).   Rec:  >>> stop all inhalers and OTC cough meds  >>> mucinex dm 1200 mg bid and enough tramadol to achieve 100% cough elimination x min of 3 days per chronic cough protocol  >>> bed blocks to reduce likelihood of LPR   >>> augmentin  x 10 more days and f/u with sinus CT or ENT eval if sinus CT not covered by insurance  >>> f/u in 4 weeks with all meds in hand using a trust but verify approach to confirm accurate Medication  Reconciliation The principal here is that until we are certain that the  patients are doing what we've asked, it makes no sense to ask them to do more.    Discussed in detail all the  indications, usual  risks and alternatives  relative to the benefits with patient who agrees to proceed with Rx as outlined.  Advised: The standardized cough guidelines  published in Chest by Charlie Coder in 2006 are still the best available and consist of a multiple step process (up to 12!) , not a single office visit,  and are intended  to address this problem logically,  with an alogrithm dependent on response to empiric treatment at  each progressive step  to determine a specific diagnosis with  minimal addtional testing needed. Therefore if adherence is an issue or can't be accurately verified,  it's very unlikely the standard evaluation and treatment will be successful here.    Furthermore, response to therapy (other than acute cough suppression, which should only be used short term with avoidance of narcotic containing cough syrups if possible), can be a gradual process for which the patient is not likely to  perceive immediate benefit.  Unlike going to an eye doctor where the best perscription is almost always the first one and is immediately effective, this is almost never the case in the management of chronic cough syndromes. Therefore the patient needs to commit up front to consistently adhere to recommendations  for up to 6 weeks of therapy directed at the likely underlying problem(s) before the response can be reasonably evaluated.          Each maintenance medication was reviewed in detail including emphasizing most importantly the difference between maintenance and prns and under what circumstances the prns are to be triggered  using an action plan format where appropriate.  Total time for H and P, chart review, counseling, reviewing hfa device(s) and generating customized AVS unique to this office visit / same day charting = 33 min for chronic refractory respiratory  symptoms of uncertain etiology

## 2024-02-27 ENCOUNTER — Inpatient Hospital Stay: Admission: RE | Admit: 2024-02-27 | Discharge: 2024-02-27 | Attending: Internal Medicine | Admitting: Internal Medicine

## 2024-02-27 DIAGNOSIS — R058 Other specified cough: Secondary | ICD-10-CM

## 2024-03-02 NOTE — Progress Notes (Signed)
 Atc x1 lmtcb

## 2024-03-09 ENCOUNTER — Ambulatory Visit: Admitting: Internal Medicine

## 2024-03-09 DIAGNOSIS — R058 Other specified cough: Secondary | ICD-10-CM

## 2024-03-09 NOTE — Progress Notes (Deleted)
 "  Brenda King, female    DOB: [birth year]   MRN: [id number]   Brief patient profile:  63  yowf quit smoking 2023 and vaped until end of Sept 20025  sefl -referred to pulmonary clinic 01/13/2024 for cough x 1st week in Sept 2025 failed augmenti/pred/hydrocodone        Pt not previously seen by [facility] service.     History of Present Illness  01/13/2024  Pulmonary/ 1st office eval/Doren Kaspar   Chief Complaint  Patient presents with   Consult    Symptoms started since September.   Cough    Constant coughing causing side pain. Productive cough with phlegm   Shortness of Breath  Dyspnea: sensation of  wt on chest midline x 2 months and R lower ant cp x 7 days- the latter pleuritic and the midline not Cough: slt yellow better p levaquin  and pred oct 26th  and levaquin  finished  01/12/24  Sleep: almost 90 degrees  SABA use: no better  02 ldz:wnwz  Patient Instructions  The key to effective treatment for your cough is eliminating the non-stop cycle of cough you're stuck in long enough to let your airway heal completely and then see if there is anything still making you cough once you stop the cough suppression, but this should take no more than 5 days to figure out First take delsym two tsp every 12 hours and supplement if needed with  Demerol   50 mg up to 1-  2 every 4 hours to suppress the urge to cough at all or even clear your throat. Swallowing water  or using ice chips/non mint and menthol containing candies (such as lifesavers or sugarless jolly ranchers) are also effective.  You should rest your voice and avoid activities that you know make you cough. Once you have eliminated the cough for 3 straight days try reducing the tramadol first,  then the delsym as tolerated.   Prednisone  10 mg take  4 each am x 2 days,   2 each am x 2 days,  1 each am x 2 days and stop (this is to eliminate allergies and inflammation from coughing) Protonix  (pantoprazole ) Take 30-60 min before first meal of the day  and Pepcid  20 mg one bedtime plus chlorpheniramine 4 mg x 1-2 at bedtime (both available over the counter)  until cough is completely gone for at least a week without the need for cough suppression  GERD diet reviewed, bed blocks rec      02/11/2024  f/u ov/Lirio Bach re: cough x 1st week in September 2025  maint on gerd rx plus started breztri  but no better  Chief Complaint  Patient presents with   Follow-up   Dyspnea:  chased grandchildren made her cough  Cough: worse with laughing  yellowish / greenish very thick (not reproduced at ov where cough was dry sounding and harsh)  Sleeping: bed is flat / 4 pillows  resp cc  SABA use: none  02: none  No longer having midline or R pleurtic  and now with new since last ov =  L ant  cp  x 15 sec usually  at rest not noct or pleuritic or exertional  Lung cancer screening :  does not meed criteria for pk-yrs   Patient Instructions  Stop breztri  and all cough syrups Mucinex dm 1200 mg every 12 hours and supplement with tramadol 50 mg   1-2 every 4 hours  Continue  Pantoprazole  (protonix ) 40 mg   Take  30-60 min  before first meal of the day and Pepcid  (famotidine )  20 mg after supper until return to office  Bed blocks or risers x 6-8 in  Augmentin  875 mg take one pill twice daily  X 10 days  Please schedule a follow up office visit in 4 weeks, sooner if needed  all meds including over the counters   Sinus CT 02/25/24  wnl    03/09/2024  f/u ov/Czarina Gingras re: ***   maint on ***   did *** bring meds  No chief complaint on file.   Dyspnea:  *** Cough: *** Sleeping: *** resp cc  SABA use: *** 02: ***  Lung cancer screening :  ***    No obvious day to day or daytime variability or assoc excess/ purulent sputum or mucus plugs or hemoptysis or cp or chest tightness, subjective wheeze or overt sinus or hb symptoms.    Also denies any obvious fluctuation of symptoms with weather or environmental changes or other aggravating or alleviating factors except  as outlined above   No unusual exposure hx or h/o childhood pna/ asthma or knowledge of premature birth.  Current Allergies, Complete Past Medical History, Past Surgical History, Family History, and Social History were reviewed in [employer] record.  ROS  The following are not active complaints unless bolded Hoarseness, sore throat, dysphagia, dental problems, itching, sneezing,  nasal congestion or discharge of excess mucus or purulent secretions, ear ache,   fever, chills, sweats, unintended wt loss or wt gain, classically pleuritic or exertional cp,  orthopnea pnd or arm/hand swelling  or leg swelling, presyncope, palpitations, abdominal pain, anorexia, nausea, vomiting, diarrhea  or change in bowel habits or change in bladder habits, change in stools or change in urine, dysuria, hematuria,  rash, arthralgias, visual complaints, headache, numbness, weakness or ataxia or problems with walking or coordination,  change in mood or  memory.          Outpatient Medications Prior to Visit  Medication Sig Dispense Refill   ALPRAZolam  (XANAX ) 0.5 MG tablet Take 0.5 mg by mouth as needed. Taking as needed related to menopause symptoms     amoxicillin -clavulanate (AUGMENTIN ) 875-125 MG tablet Take 1 tablet by mouth 2 (two) times daily. 20 tablet 0   aspirin EC 81 MG tablet Take 81 mg by mouth daily. Swallow whole.     escitalopram (LEXAPRO) 10 MG tablet Take 10 mg by mouth daily.     estradiol (ESTRACE) 2 MG tablet Take 2 mg by mouth daily.     famotidine  (PEPCID ) 20 MG tablet One after supper 30 tablet 11   pantoprazole  (PROTONIX ) 40 MG tablet Take 1 tablet (40 mg total) by mouth daily. Take 30-60 min before first meal of the day 30 tablet 2   progesterone (PROMETRIUM) 100 MG capsule Take 100 mg by mouth at bedtime.     Rimegepant Sulfate (NURTEC) 75 MG TBDP Take 1 tablet (75 mg total) by mouth daily as needed. For migraines. Take as close to onset of migraine as possible.  One daily maximum. Please use copay card: BIN [id number] PCN CN GRP [id number] ID [id number] EXP 03/17/2024 16 tablet 11   No facility-administered medications prior to visit.         Past Medical History:  Diagnosis Date   Anxiety    Atrial fibrillation [facility])    Status post ablation 2004 and 2005 - Dr. Epifanio [facility])   Constipation    Degenerative disc disease, cervical    Menopause  Nodular infiltrative basal cell carcinoma (BCC) 07/04/2021   Left Shoulder      Objective:     Wts  03/09/2024      ***  02/11/24 170 lb (77.1 kg)  02/04/24 169 lb (76.7 kg)  01/13/24 171 lb (77.6 kg)      Vital signs reviewed  03/09/2024  - Note at rest 02 sats  ***% on ***   General appearance:    ***   amb wf nad/ harsh dry sounding cough on inspiration ***               Assessment                 "

## 2024-03-22 ENCOUNTER — Telehealth: Payer: Self-pay | Admitting: Neurology

## 2024-03-22 ENCOUNTER — Ambulatory Visit: Admitting: Internal Medicine

## 2024-03-22 NOTE — Telephone Encounter (Signed)
 Pt called to cancel appt  , Appt is not needed at this time   Appt Canceled

## 2024-03-23 ENCOUNTER — Ambulatory Visit: Admitting: Neurology

## 2024-03-28 ENCOUNTER — Other Ambulatory Visit: Payer: Self-pay | Admitting: Podiatry

## 2024-04-05 ENCOUNTER — Encounter: Payer: Self-pay | Admitting: Internal Medicine

## 2024-04-05 ENCOUNTER — Encounter: Payer: Self-pay | Admitting: Family Medicine

## 2024-04-06 ENCOUNTER — Telehealth: Payer: Self-pay

## 2024-04-06 NOTE — Telephone Encounter (Signed)
 Called and left message for patient we received Long Term Disability forms and that she needs to make appt with Dr Bluford to go over this paperwork.

## 2024-04-07 ENCOUNTER — Encounter: Payer: Self-pay | Admitting: Family Medicine

## 2024-04-07 ENCOUNTER — Ambulatory Visit: Admitting: Family Medicine

## 2024-04-07 VITALS — BP 123/73 | HR 75 | Temp 98.1°F | Ht 69.0 in | Wt 173.0 lb

## 2024-04-07 DIAGNOSIS — Z029 Encounter for administrative examinations, unspecified: Secondary | ICD-10-CM | POA: Insufficient documentation

## 2024-04-07 DIAGNOSIS — M542 Cervicalgia: Secondary | ICD-10-CM

## 2024-04-07 NOTE — Patient Instructions (Signed)
 We can send records; however, I'm not sure it will be what they are needed regarding your ability to return to work.  Please reach out to Dr. Louis regarding disability.

## 2024-04-07 NOTE — Progress Notes (Signed)
 "  Subjective:  Patient ID: Brenda King, female    DOB: May 19, 1960  Age: 64 y.o. MRN: [id number]  CC:   Chief Complaint  Patient presents with   forms    Disability forms    HPI: 64 year old female presents for the above. Paperwork was given to me regarding disability.  Patient states that she has had ongoing issues with neck pain as well as low back pain.  She has also had ongoing respiratory issues.  I saw her in October 2025 on 2 occasions for respiratory infections.  She has since seen pulmonology.  She states that she has received injections and ablation from her neurosurgeon.  She states that he has taken her out of work.  She states that she is also suffering from plantar fibromas which are causing difficulty.  She follows with podiatry.  I have advised the patient that I have not been the provider who is taking her out of work and I do not have all of the necessary documentation regarding this.  Patient Active Problem List   Diagnosis Date Noted   Administrative encounter 04/07/2024   DOE (dyspnea on exertion) 01/13/2024   Upper airway cough syndrome 01/13/2024   Worsening headaches 01/04/2023   Midline low back pain without sciatica 10/02/2022   Migraine without aura and without status migrainosus, not intractable 06/03/2017   Chronic idiopathic constipation 02/04/2013   Allergic rhinitis 07/09/2012    Social Hx   Social History   Socioeconomic History   Marital status: Married    Spouse name: Not on file   Number of children: Not on file   Years of education: Not on file   Highest education level: Not on file  Occupational History   Not on file  Tobacco Use   Smoking status: Former    Current packs/day: 0.25    Average packs/day: 0.3 packs/day for 30.0 years (7.5 ttl pk-yrs)    Types: Cigarettes   Smokeless tobacco: Former    Quit date: 11/04/2012  Vaping Use   Vaping status: Every Day  Substance and Sexual Activity   Alcohol use: No    Alcohol/week: 0.0  standard drinks of alcohol   Drug use: No   Sexual activity: Yes    Birth control/protection: Surgical  Other Topics Concern   Not on file  Social History Narrative   Not on file   Social Drivers of Health   Tobacco Use: Medium Risk (04/07/2024)   Patient History    Smoking Tobacco Use: Former    Smokeless Tobacco Use: Former    Passive Exposure: Not on Actuary Strain: Low Risk (01/05/2024)   Overall Financial Resource Strain (CARDIA)    Difficulty of Paying Living Expenses: Not very hard  Food Insecurity: Patient Declined (01/05/2024)   Epic    Worried About Programme Researcher, Broadcasting/film/video in the Last Year: Patient declined    Barista in the Last Year: Patient declined  Transportation Needs: Patient Declined (01/05/2024)   Epic    Lack of Transportation (Medical): Patient declined    Lack of Transportation (Non-Medical): Patient declined  Physical Activity: Inactive (01/05/2024)   Exercise Vital Sign    Days of Exercise per Week: 0 days    Minutes of Exercise per Session: Not on file  Stress: Stress Concern Present (01/05/2024)   [employer] of Occupational [employer] - Occupational Stress Questionnaire    Feeling of Stress: Rather much  Social Connections: Unknown (01/05/2024)   Social Connection  and Isolation Panel    Frequency of Communication with Friends and Family: Twice a week    Frequency of Social Gatherings with Friends and Family: Patient declined    Attends Religious Services: More than 4 times per year    Active Member of Clubs or Organizations: No    Attends Engineer, Structural: Not on file    Marital Status: Married  Depression (PHQ2-9): Medium Risk (04/07/2024)   Depression (PHQ2-9)    PHQ-2 Score: 8  Alcohol Screen: Not on file  Housing: Patient Declined (01/05/2024)   Epic    Unable to Pay for Housing in the Last Year: Patient declined    Number of Times Moved in the Last Year: Not on file    Homeless in the Last Year:  Patient declined  Utilities: Not on file  Health Literacy: Not on file    Review of Systems Per HPI  Objective:  BP 123/73   Pulse 75   Temp 98.1 F (36.7 C)   Ht 5' 9 (1.753 m)   Wt 173 lb (78.5 kg)   SpO2 97%   BMI 25.55 kg/m      04/07/2024    3:20 PM 02/11/2024    2:18 PM 02/04/2024    3:23 PM  BP/Weight  Systolic BP 123 118 112  Diastolic BP 73 74 76  Wt. (Lbs) 173 170 169  BMI 25.55 kg/m2 25.1 kg/m2 24.96 kg/m2    Physical Exam Vitals and nursing note reviewed.  Constitutional:      General: She is not in acute distress.    Appearance: Normal appearance.  HENT:     Head: Normocephalic and atraumatic.  Neurological:     Mental Status: She is alert.  Psychiatric:        Mood and Affect: Mood normal.        Behavior: Behavior normal.     Lab Results  Component Value Date   WBC 9.2 01/13/2024   HGB 14.6 01/13/2024   HCT 44.2 01/13/2024   PLT 454.0 (H) 01/13/2024   GLUCOSE 80 01/13/2024   CHOL 161 12/20/2016   TRIG 60 12/20/2016   HDL 61 12/20/2016   LDLCALC 88 12/20/2016   ALT 15 01/01/2023   AST 18 01/01/2023   NA 140 01/13/2024   K 4.8 01/13/2024   CL 101 01/13/2024   CREATININE 0.80 01/13/2024   BUN 11 01/13/2024   CO2 30 01/13/2024   TSH 2.28 01/13/2024     Assessment & Plan:  Administrative encounter Assessment & Plan: I have advised the patient that she should take the paperwork to the provider who has taken her out of work.  I do not have all of the necessary documentation imaging and treatments.  Additionally, I have not been willing to take her out of work.  She will reach out to her neurosurgeon.    Jacqulyn Ahle DO [facility] "

## 2024-04-07 NOTE — Assessment & Plan Note (Signed)
 I have advised the patient that she should take the paperwork to the provider who has taken her out of work.  I do not have all of the necessary documentation imaging and treatments.  Additionally, I have not been willing to take her out of work.  She will reach out to her neurosurgeon.

## 2024-04-08 ENCOUNTER — Other Ambulatory Visit: Payer: Self-pay | Admitting: Internal Medicine

## 2024-04-08 DIAGNOSIS — R058 Other specified cough: Secondary | ICD-10-CM

## 2024-04-20 ENCOUNTER — Ambulatory Visit: Admitting: Neurology
